# Patient Record
Sex: Male | Born: 1965 | Race: White | Hispanic: No | Marital: Married | Smoking: Never smoker
Health system: Southern US, Community
[De-identification: ages and names within clinical notes are randomized; demographics above are authoritative.]

## PROBLEM LIST (undated history)

## (undated) DIAGNOSIS — J45909 Unspecified asthma, uncomplicated: Secondary | ICD-10-CM

## (undated) DIAGNOSIS — R12 Heartburn: Secondary | ICD-10-CM

## (undated) DIAGNOSIS — I10 Essential (primary) hypertension: Secondary | ICD-10-CM

## (undated) DIAGNOSIS — J309 Allergic rhinitis, unspecified: Secondary | ICD-10-CM

## (undated) DIAGNOSIS — K219 Gastro-esophageal reflux disease without esophagitis: Secondary | ICD-10-CM

## (undated) DIAGNOSIS — R0789 Other chest pain: Secondary | ICD-10-CM

## (undated) DIAGNOSIS — R519 Headache, unspecified: Secondary | ICD-10-CM

## (undated) DIAGNOSIS — F410 Panic disorder [episodic paroxysmal anxiety] without agoraphobia: Secondary | ICD-10-CM

## (undated) DIAGNOSIS — N400 Enlarged prostate without lower urinary tract symptoms: Secondary | ICD-10-CM

## (undated) DIAGNOSIS — Q400 Congenital hypertrophic pyloric stenosis: Secondary | ICD-10-CM

## (undated) DIAGNOSIS — Z87442 Personal history of urinary calculi: Secondary | ICD-10-CM

## (undated) HISTORY — DX: Unspecified asthma, uncomplicated: J45.909

## (undated) HISTORY — DX: Congenital hypertrophic pyloric stenosis: Q40.0

## (undated) HISTORY — PX: OTHER SURGICAL HISTORY: SHX169

## (undated) HISTORY — DX: Panic disorder (episodic paroxysmal anxiety): F41.0

## (undated) HISTORY — DX: Heartburn: R12

## (undated) HISTORY — DX: Benign prostatic hyperplasia without lower urinary tract symptoms: N40.0

## (undated) HISTORY — DX: Gastro-esophageal reflux disease without esophagitis: K21.9

## (undated) HISTORY — DX: Other chest pain: R07.89

## (undated) HISTORY — DX: Allergic rhinitis, unspecified: J30.9

## (undated) HISTORY — DX: Personal history of urinary calculi: Z87.442

---

## 1994-06-14 HISTORY — PX: OTHER SURGICAL HISTORY: SHX169

## 2006-08-10 ENCOUNTER — Ambulatory Visit: Payer: Self-pay | Admitting: Family Medicine

## 2006-08-10 LAB — CONVERTED CEMR LAB
ALT: 26 units/L (ref 0–40)
Albumin: 4.1 g/dL (ref 3.5–5.2)
Alkaline Phosphatase: 45 units/L (ref 39–117)
BUN: 15 mg/dL (ref 6–23)
CO2: 28 meq/L (ref 19–32)
Calcium: 9.6 mg/dL (ref 8.4–10.5)
GFR calc Af Amer: 120 mL/min
GFR calc non Af Amer: 99 mL/min
Potassium: 4.6 meq/L (ref 3.5–5.1)
Total CHOL/HDL Ratio: 4
Total Protein: 6.8 g/dL (ref 6.0–8.3)
Triglycerides: 43 mg/dL (ref 0–149)
VLDL: 9 mg/dL (ref 0–40)

## 2007-01-11 ENCOUNTER — Ambulatory Visit: Payer: Self-pay | Admitting: Family Medicine

## 2007-01-11 DIAGNOSIS — F410 Panic disorder [episodic paroxysmal anxiety] without agoraphobia: Secondary | ICD-10-CM | POA: Insufficient documentation

## 2007-01-11 DIAGNOSIS — K219 Gastro-esophageal reflux disease without esophagitis: Secondary | ICD-10-CM | POA: Insufficient documentation

## 2007-01-25 ENCOUNTER — Telehealth (INDEPENDENT_AMBULATORY_CARE_PROVIDER_SITE_OTHER): Payer: Self-pay | Admitting: *Deleted

## 2007-02-03 ENCOUNTER — Encounter: Payer: Self-pay | Admitting: Family Medicine

## 2007-02-03 DIAGNOSIS — J309 Allergic rhinitis, unspecified: Secondary | ICD-10-CM | POA: Insufficient documentation

## 2007-02-15 ENCOUNTER — Ambulatory Visit: Payer: Self-pay | Admitting: Family Medicine

## 2007-07-26 ENCOUNTER — Telehealth: Payer: Self-pay | Admitting: Family Medicine

## 2007-08-28 ENCOUNTER — Telehealth: Payer: Self-pay | Admitting: Family Medicine

## 2007-08-30 ENCOUNTER — Ambulatory Visit: Payer: Self-pay | Admitting: Family Medicine

## 2007-08-30 DIAGNOSIS — R0789 Other chest pain: Secondary | ICD-10-CM | POA: Insufficient documentation

## 2007-09-26 ENCOUNTER — Ambulatory Visit: Payer: Self-pay | Admitting: Cardiology

## 2007-10-03 ENCOUNTER — Encounter: Payer: Self-pay | Admitting: Family Medicine

## 2007-10-03 ENCOUNTER — Ambulatory Visit: Payer: Self-pay | Admitting: Cardiology

## 2007-10-03 ENCOUNTER — Ambulatory Visit: Payer: Self-pay

## 2007-10-03 LAB — CONVERTED CEMR LAB
AST: 23 units/L (ref 0–37)
Albumin: 4.4 g/dL (ref 3.5–5.2)
BUN: 20 mg/dL (ref 6–23)
CO2: 23 meq/L (ref 19–32)
Calcium: 8.9 mg/dL (ref 8.4–10.5)
Chloride: 107 meq/L (ref 96–112)
Cholesterol: 141 mg/dL (ref 0–200)
Creatinine, Ser: 0.97 mg/dL (ref 0.40–1.50)
Glucose, Bld: 87 mg/dL (ref 70–99)
HDL: 43 mg/dL (ref >39)
Potassium: 4.5 meq/L (ref 3.5–5.3)
Total CHOL/HDL Ratio: 3.3

## 2008-01-08 ENCOUNTER — Telehealth: Payer: Self-pay | Admitting: Family Medicine

## 2008-12-20 ENCOUNTER — Ambulatory Visit: Payer: Self-pay | Admitting: Family Medicine

## 2008-12-20 DIAGNOSIS — R5381 Other malaise: Secondary | ICD-10-CM | POA: Insufficient documentation

## 2008-12-20 DIAGNOSIS — R5383 Other fatigue: Secondary | ICD-10-CM

## 2008-12-25 ENCOUNTER — Ambulatory Visit: Payer: Self-pay | Admitting: Family Medicine

## 2008-12-26 ENCOUNTER — Encounter: Payer: Self-pay | Admitting: Family Medicine

## 2008-12-26 LAB — CONVERTED CEMR LAB
Albumin: 4 g/dL (ref 3.5–5.2)
Alkaline Phosphatase: 45 units/L (ref 39–117)
BUN: 14 mg/dL (ref 6–23)
Chloride: 108 meq/L (ref 96–112)
Cholesterol: 138 mg/dL (ref 0–200)
Eosinophils Relative: 6.4 % — ABNORMAL HIGH (ref 0.0–5.0)
Glucose, Bld: 91 mg/dL (ref 70–99)
HCT: 42.5 % (ref 39.0–52.0)
Hemoglobin: 14.7 g/dL (ref 13.0–17.0)
LDL Cholesterol: 94 mg/dL (ref 0–99)
Lymphs Abs: 1.7 10*3/uL (ref 0.7–4.0)
MCV: 88.6 fL (ref 78.0–100.0)
Monocytes Absolute: 0.5 10*3/uL (ref 0.1–1.0)
Monocytes Relative: 8.3 % (ref 3.0–12.0)
Neutro Abs: 2.9 10*3/uL (ref 1.4–7.7)
Phosphorus: 2.7 mg/dL (ref 2.3–4.6)
Potassium: 4.7 meq/L (ref 3.5–5.1)
Total Bilirubin: 0.9 mg/dL (ref 0.3–1.2)
Total CHOL/HDL Ratio: 4
VLDL: 8.2 mg/dL (ref 0.0–40.0)
Vitamin B-12: 293 pg/mL (ref 211–911)
WBC: 5.5 10*3/uL (ref 4.5–10.5)

## 2009-05-12 ENCOUNTER — Ambulatory Visit: Payer: Self-pay | Admitting: Family Medicine

## 2009-05-12 DIAGNOSIS — K5289 Other specified noninfective gastroenteritis and colitis: Secondary | ICD-10-CM | POA: Insufficient documentation

## 2009-09-29 ENCOUNTER — Telehealth: Payer: Self-pay | Admitting: Family Medicine

## 2010-07-14 NOTE — Progress Notes (Signed)
Summary: Omeprazole  Phone Note From Pharmacy   [facility]er: [facility]  [address] [phone]* Call For: Dr. Ermalene Searing  Summary of Call: Faxed refill request for Omeprazole 40 mg. capsule.  Take 1 capsule every morning.    This is not on the patient's meds list.   Initial call taken by: Delilah Shan CMA (AAMA),  September 29, 2009 10:28 AM    New/Updated Medications: OMEPRAZOLE 40 MG CPDR (OMEPRAZOLE) Take 1 tablet by mouth once a day Prescriptions: OMEPRAZOLE 40 MG CPDR (OMEPRAZOLE) Take 1 tablet by mouth once a day  #30 x 11   Entered and Authorized by:   Kerby Nora MD   Signed by:   Kerby Nora MD on 09/29/2009   Method used:   [facility]        [facility]  [address] [phone]* (retail)       [address]       [place], [place]  [address]       Ph: [phone] or [phone]       Fax: [phone]   RxID:   [id number]

## 2010-10-27 NOTE — Assessment & Plan Note (Signed)
[facility] OFFICE NOTE   Blake Molina, Blake Molina                        MRN:          [id number]  DATE:09/26/2007                            DOB:          02-16-1966    REASON FOR CONSULTATION:  Evaluate patient with chest pain.   HISTORY OF PRESENT ILLNESS:  The patient is a 45 year old, white  gentleman without prior cardiac history.  He does have a family history  of early heart disease.  He has been having chest discomfort.  He says  this has been going on for months.  It is sporadic.  When he gets it, it  may last for a 1/2 hour at a time.  He does not associate it with  activity.  He says he does not think it feels like his reflux which is a  severe burning.  This discomfort is 2/10 in intensity.  It seems to  concentrate in the left upper sternal area.  He may notice some  exacerbation when he tries to swallow hard.  He says it feels like the  food may be stuck in that area.  He does not get any associated nausea,  vomiting or diaphoresis.  He does not get any jaw or arm discomfort.  He  works hard, although he does not exercise routinely.  With his vigorous  activity, he does not bring on these symptoms.  He thinks the symptoms  have been a fairly stable pattern over the months.  However, because of  his family history, he is referred for further cardiac evaluation.  He  did have a mildly abnormal EKG with some peaked T-waves and RSR prime in  V1.   PAST MEDICAL HISTORY:  Gastroesophageal reflux disease.   PAST SURGICAL HISTORY:  1. Lumbar surgery.  2. Pyloric stenosis repaired as a child.   ALLERGIES:  No known drug allergies.   MEDICATIONS:  1. Albuterol.  2. Advil.  3. BuSpar 5 mg nightly.  4. Prilosec 40 mg daily.  5. Tums.  6. Asthmanex.   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  He is married.  He has three teenage children.  He has never  smoked cigarettes.  He rarely drinks alcohol.   FAMILY HISTORY:  Contributory for his father needing coronary stent at  age 25.   REVIEW OF SYSTEMS:  Positive for occasional headaches, chest pain, rare  palpitations, probable sleep apnea, occasional asthma.  Negative for  other systems.   PHYSICAL EXAMINATION:  GENERAL:  The patient is in no distress.  VITAL SIGNS:  Blood pressure 118/76, heart rate 64 and regular, weight  159 pounds.  HEENT:  Eyes are unremarkable, pupils equal, round, reactive to light,  fundi within normal limits.  Oral mucosa unremarkable.  NECK:  No jugular distention at 45 degrees.  No carotid upstroke.  Brisk  and symmetrical.  No bruits, no thyromegaly.  LYMPHATICS:  No cervical, axillary or inguinal adenopathy.  LUNGS:  Clear to auscultation bilaterally.  BACK:  No costovertebral angle tenderness.  CHEST:  Unremarkable.  HEART:  PMI not displaced or sustained, S1 and S2 within normal limits,  no S3 and S4.  No clicks, rubs, murmurs.  ABDOMEN:  Flat, positive bowel sounds.  Normal in frequency and pitch,  no bruits, rebound, no guarding or midline pulsatile masses, no  hepatomegaly or splenomegaly.  SKIN:  No rashes, no nodules.  EXTREMITIES:  2+ pulses throughout, no edema, cyanosis or clubbing.  NEUROLOGIC:  Oriented to person, place and time.  Cranial nerves II-XII  grossly intact.  Motor grossly intact.   EKG sinus rhythm, axis within normal limits, intervals within normal  limits, early repolarization pattern.   ASSESSMENT/PLAN:  1. Chest pain.  The patient's chest pain is atypical from a      cardiovascular standpoint.  However, he does have a family history.      Given this, I think exercise stress testing with a POET (plain old      exercise treadmill) is reasonable.  This will allow [place] to screen      for coronary disease for which he has a low pretest probability.      This will allow [place] to risk stratify and most importantly give him a       prescription for exercise.  2. Risk reduction.  He needs a lipid profile and he will have this      done fasting.  We would be happy to review this.  3. Followup will be based on results of the above.     Rollene Rotunda, MD, [facility]  Electronically Signed    JH/MedQ  DD: 09/26/2007  DT: 09/26/2007  Job #: [id number]   cc:   Kerby Nora, MD

## 2010-10-30 NOTE — Assessment & Plan Note (Signed)
[facility]                           [place] OFFICE NOTE   Blake Molina, Blake Molina                        MRN:          [id number]  DATE:08/10/2006                            DOB:          [birth year]    CHIEF COMPLAINT:  A 45 year old white male here to establish new doctor.   HISTORY OF PRESENT ILLNESS:  Mr. Blake Molina has not seen a doctor in many  years.  He comes to clinic today with the following concerns:  1. Reflux, chronic:  He has tried Prilosec over-the-counter 20 mg      which helped some.  He states he has also tried to decrease the      amount of caffeine in the soda he drinks, as well as chocolate.  He      states his symptoms are mainly burning and sour taste in his throat      before and after eating.  He also has nausea before he eats, which      goes away with food.  He denies any abdominal pain, vomiting,      diarrhea, constipation, or blood in his stool.  2. 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  He is unaware of anything that may have triggered this      response.  He has 30 seconds of sudden-onset jittery and      nervousness; no associated chest pain, shortness of breath,      headache, dizziness.  They had been happening about two times a      week for a few months but have not happened for a while.  He is      under a lot of stress at work as well as stress at home over      frustration concerns with his wife's and his libido not matching.      He denies any depressive mood, manic symptoms, or any past history      of anything similar.  3. Asthma, moderate persistent:  He states that he has had asthma      since being a child and it has worsened some over the past few      years.  He states he uses albuterol for a sensation of labored      breathing approximately daily.  He states that this handles it most  often.  He denies being on a controller medicine in the past.  He      denies nighttime cough.  He was hospitalized as a child for asthma      but has not had an asthma attack in many years.   REVIEW OF SYSTEMS:  Otherwise negative.   PAST MEDICAL HISTORY:  1. Asthma, moderate persistent.  2. GERD.  3. Allergic rhinitis.  4. History of pyloric stenosis as infant.   HOSPITALIZATIONS, SURGERIES, PROCEDURES:  1. Hospitalized at age 23 for asthma; no intubation.  2. In 1960s, pyloric  stenosis repair.  3. In 1996, herniated disc resulting in discectomy.   ALLERGIES:  None.   MEDICATIONS:  1. Albuterol inhaled two puffs q.4h. p.r.n.  2. Advil p.r.n.  3. Claritin p.r.n.  4. Tums p.r.n.   SOCIAL HISTORY:  No tobacco use.  Occasional alcohol use about one time  per month.  No history of drug use.  He works as a Clinical cytogeneticist.  He is married.  He has three children who are healthy.  He walks at work  and has an elliptical but does not use it regularly.  He eats about two  meals per day including fruits and vegetables and about one to two times  per week eats fast food.   FAMILY HISTORY:  Father alive at age 96 with coronary artery disease  resulting in a stent placement recently, as well as hypertension.  Mother alive at age 62 with allergies.  Paternal grandfather with  hypertension.  One brother with asthma and allergies.  No family history  of any type of cancer.   PHYSICAL EXAMINATION:  VITAL SIGNS:  Height 70 inches, weight 166.4,  making BMI 22-23.  Blood pressure 104/70, pulse 72, temperature 98.  GENERAL:  Healthy-appearing male in no apparent distress.  HEENT:  PERRLA, extraocular muscles intact, oropharynx clear.  Pterygium  bilateral medial eye.  Nares clear.  Oropharynx clear.  No thyromegaly,  no lymphadenopathy supraclavicular or cervical.  CARDIOVASCULAR:  Regular rate and rhythm.  No murmurs, rubs or gallops.  Normal PMI, 2+ peripheral pulses, no peripheral  edema.  LUNGS:  Clear to auscultation bilaterally.  No wheezes, rales, or  rhonchi.  ABDOMEN:  Soft, nontender, normal active bowel sounds, no  hepatosplenomegaly, no rebound, no guarding.  MUSCULOSKELETAL:  Strength 5/5 in upper and lower extremities.  NEUROLOGIC:  Cranial nerves II-XII grossly intact.  PSYCHIATRIC:  Appropriate affect, denies hallucinations as well as  suicidality.   ASSESSMENT AND PLAN:  1. Gastroesophageal reflux disease plus or minus gastritis:  Will      initiate him on omeprazole 40 mg daily for the next 6 weeks.  He      will then begin to try and taper this down.  If his symptoms do not      improve with the 40 mg of omeprazole, he will let me know and we      can try a stronger medication.  If then the stronger medication      such as Nexium, AcipHex, or Protonix does not work, we will refer      him to a gastroenterologist for EGD.  I also gave him information      and instructed him on how to behaviorally and with diet reduce      reflux and gastritis symptoms.  He will also begin eating more      regularly three meals a day with healthy snacks in-between, and      small portion sizes.  2. Panic symptoms:  His symptoms are very momentary.  We discussed      possible treatment for this with having him see a counselor or      therapist for stress reduction.  He is not interested in this at      this point in time.  I do not think a medication such as Ativan      would be helpful, given the short length of symptoms.  I also      suggested marriage counseling to decrease the stress regarding  the      libido difference in his wife, who is going through menopause.  3. Asthma, moderate persistent:  We discussed the need for a      controller medication.  He is not interested in starting this at      this point in time.  He did have peak flows today that were      excellent at around 700, with his goal being between 487 to 609.     He will try and limit albuterol  use so that he does not build up a      tolerance.  He will let me know if he is having labored breathing      with activities or frequent albuterol use or nighttime cough.  4. Prevention:  We will check a cholesterol panel as well as a      complete metabolic panel.  He is encouraged to take multivitamin as      well as to work on regular exercise and healthy eating habits.  He      is not due for a PSA until age 59.  He will return as needed.     Kerby Nora, MD  Electronically Signed    AB/MedQ  DD: 08/10/2006  DT: 08/10/2006  Job #: [id number]

## 2010-12-03 ENCOUNTER — Encounter: Payer: Self-pay | Admitting: Cardiovascular Disease

## 2010-12-17 ENCOUNTER — Encounter: Payer: Self-pay | Admitting: Family Medicine

## 2010-12-18 ENCOUNTER — Ambulatory Visit (INDEPENDENT_AMBULATORY_CARE_PROVIDER_SITE_OTHER): Payer: BC Managed Care – PPO | Admitting: Family Medicine

## 2010-12-18 ENCOUNTER — Encounter: Payer: Self-pay | Admitting: Family Medicine

## 2010-12-18 VITALS — BP 110/80 | HR 60 | Temp 98.4°F | Wt 159.1 lb

## 2010-12-18 DIAGNOSIS — R002 Palpitations: Secondary | ICD-10-CM | POA: Insufficient documentation

## 2010-12-18 NOTE — Patient Instructions (Signed)
Pass by Marion's or Cynthia's office to schedule cardiology appointmnet for holter monitor to check on irregular heart beat. You've done all the things I would recommend including cutting back on energy drink and caffeine.  Push more water. Update [place] if worsening, or any chest pain, or prolonged shortness of breath. Good to meet you today, call us with questions.

## 2010-12-18 NOTE — Assessment & Plan Note (Addendum)
Happening daily for last 2 wks. Set up with 24 hour holter monitor. Discussed likely benign ie PAC, PVC but will set up with 24 hour holter monitor. States had recent exercise treadmill a few years back and told all normal.  No results in system. Encouraged abstinence from energy drinks, caffeine.  EKG - sinus brady 51, nl axis, intervals, no hypertrophy, ? Early replarization, overall unchanged from previous EKG.  Rsr, but normal QRS duration.

## 2010-12-18 NOTE — Progress Notes (Signed)
  Subjective:    Patient ID: Blake Molina, male    DOB: 09/12/1965, 45 y.o.   MRN: [id number]  HPI CC: palpitations  2 wk h/o palpitations, heart skipping beat then loud beat afterwards.  Tends to happen when driving or sitting at home, not at work.  Feels mild quick SOB with episodes that lasts seconds.  No problems in am, more at night.  Not waking up from sleep.  Happened last night for 30 min.  Denies chest pain/tightness.  Denies tachypalpitations or racing heart.  Denies dizziness, lightheadedness, HA, vision changes.  No LOC.    Caffeine - used to drink one energy drink (Amp) daily and pepsi, sweet tea, coffee.  1 wk ago cut back significantly on caffeine, no more Amp, didn't notice improvement.  Eats chocolate.  Nonsmoker.  Several years ago had episodes of panic, improved with bupsar but not on anymore.  Stress overall stable.  Works for himself, stays good busy.  FmHx: father with stent placed 1 year ago at age 23yo.    Lab Results  Component Value Date   LDLCALC 94 12/25/2008   Review of Systems Per HPI    Objective:   Physical Exam  Nursing note and vitals reviewed. Constitutional: He appears well-developed and well-nourished. No distress.  HENT:  Head: Normocephalic and atraumatic.  Mouth/Throat: Oropharynx is clear and moist. No oropharyngeal exudate.  Eyes: Conjunctivae and EOM are normal. Pupils are equal, round, and reactive to light. No scleral icterus.  Neck: Normal range of motion. Neck supple. No thyromegaly present.  Cardiovascular: Normal rate, regular rhythm, normal heart sounds and intact distal pulses.   No murmur heard. Pulmonary/Chest: Effort normal and breath sounds normal. No respiratory distress. He has no wheezes. He has no rales.  Lymphadenopathy:    He has no cervical adenopathy.  Skin: Skin is warm and dry. No rash noted.  Psychiatric: He has a normal mood and affect.          Assessment & Plan:

## 2010-12-21 ENCOUNTER — Encounter: Payer: Self-pay | Admitting: *Deleted

## 2010-12-23 ENCOUNTER — Other Ambulatory Visit: Payer: Self-pay | Admitting: Family Medicine

## 2010-12-23 ENCOUNTER — Ambulatory Visit (INDEPENDENT_AMBULATORY_CARE_PROVIDER_SITE_OTHER): Payer: BC Managed Care – PPO | Admitting: *Deleted

## 2010-12-23 DIAGNOSIS — R002 Palpitations: Secondary | ICD-10-CM

## 2010-12-30 ENCOUNTER — Telehealth: Payer: Self-pay | Admitting: *Deleted

## 2010-12-30 NOTE — Telephone Encounter (Signed)
Spoke to pt's wife regarding results of holter. Normal sinus rhythm with rare PACs and PVCs. Pt did not report any episodes or symptoms in diary for holter. Notified wife that these extra beats are typically benign, and would not require treatment unless pt symptomatic or bothersome and wanted to take medication, in which case usually treated with a beta blocker. She will notify pt of results and he is to call with any questions or concerns. Will forward this note to PCP Dr. Sharen Hones, and will fax as well. Please advise of anything different. Thanks.

## 2010-12-30 NOTE — Telephone Encounter (Signed)
Noted thanks.  Will await fax.

## 2011-02-17 ENCOUNTER — Telehealth: Payer: Self-pay | Admitting: *Deleted

## 2011-02-17 MED ORDER — ALBUTEROL 90 MCG/ACT IN AERS: 1.0000 | INHALATION_SPRAY | RESPIRATORY_TRACT | Status: DC | PRN

## 2011-02-17 MED ORDER — MOMETASONE FUROATE 220 MCG/INH IN AEPB: 1.0000 | INHALATION_SPRAY | RESPIRATORY_TRACT | Status: DC

## 2011-02-17 NOTE — Telephone Encounter (Signed)
Patient requested refills on both inhalers.  He stated that he generally does not need to use the inhalers much but for some reason he is using them more now.  He requested three inhalers so he can keep one at home, work, and in the car.  [facility]

## 2012-05-09 ENCOUNTER — Other Ambulatory Visit: Payer: Self-pay

## 2012-05-09 MED ORDER — ALBUTEROL SULFATE HFA 108 (90 BASE) MCG/ACT IN AERS: 2.0000 | INHALATION_SPRAY | Freq: Four times a day (QID) | RESPIRATORY_TRACT | Status: DC | PRN

## 2012-05-09 NOTE — Telephone Encounter (Signed)
Ok, ventolin 2 puffs q 4 hours prn wheezing, #1, 0 refills

## 2012-05-09 NOTE — Telephone Encounter (Signed)
Pt left v/m requesting Albuterol refill to [facility] [place]. When tried to bring up refill under meds & orders list; note came up no longer available for order; needs new orders. Pt last seen 12/18/10 for palpitations.Please advise.

## 2013-11-13 ENCOUNTER — Encounter: Payer: Self-pay | Admitting: Cardiovascular Disease

## 2014-09-17 ENCOUNTER — Other Ambulatory Visit: Payer: Self-pay | Admitting: Family Medicine

## 2014-09-17 ENCOUNTER — Telehealth: Payer: Self-pay | Admitting: Family Medicine

## 2014-09-17 NOTE — Telephone Encounter (Signed)
Pt called and insisted on getting a refill on albuterol inhalers.  Stated he wanted 3, one for each car.  Offered to make an appointment for pt, has been over 3 years since seen.  Pt declined, will go to ER or to urgent care in the [place] area.  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

## 2014-09-17 NOTE — Telephone Encounter (Signed)
Noted  

## 2015-08-04 ENCOUNTER — Emergency Department
Admission: EM | Admit: 2015-08-04 | Discharge: 2015-08-04 | Disposition: A | Discharge status: Home / Self Care | Payer: BLUE CROSS/BLUE SHIELD | Attending: Emergency Medicine | Admitting: Emergency Medicine

## 2015-08-04 ENCOUNTER — Emergency Department: Payer: BLUE CROSS/BLUE SHIELD

## 2015-08-04 DIAGNOSIS — R1032 Left lower quadrant pain: Secondary | ICD-10-CM | POA: Diagnosis present

## 2015-08-04 DIAGNOSIS — Z79899 Other long term (current) drug therapy: Secondary | ICD-10-CM | POA: Insufficient documentation

## 2015-08-04 DIAGNOSIS — N200 Calculus of kidney: Secondary | ICD-10-CM

## 2015-08-04 LAB — COMPREHENSIVE METABOLIC PANEL
ALBUMIN: 4.3 g/dL (ref 3.5–5.0)
ALT: 21 U/L (ref 17–63)
ANION GAP: 4 — AB (ref 5–15)
AST: 26 U/L (ref 15–41)
Alkaline Phosphatase: 48 U/L (ref 38–126)
BILIRUBIN TOTAL: 0.7 mg/dL (ref 0.3–1.2)
BUN: 18 mg/dL (ref 6–20)
CHLORIDE: 111 mmol/L (ref 101–111)
CO2: 27 mmol/L (ref 22–32)
Calcium: 9.6 mg/dL (ref 8.9–10.3)
Creatinine, Ser: 1.08 mg/dL (ref 0.61–1.24)
GFR calc Af Amer: >60 mL/min (ref >60)
GFR calc non Af Amer: >60 mL/min (ref >60)
GLUCOSE: 114 mg/dL — AB (ref 65–99)
POTASSIUM: 3.4 mmol/L — AB (ref 3.5–5.1)
Sodium: 142 mmol/L (ref 135–145)
TOTAL PROTEIN: 7.1 g/dL (ref 6.5–8.1)

## 2015-08-04 LAB — URINALYSIS COMPLETE WITH MICROSCOPIC (ARMC ONLY)
Bilirubin Urine: NEGATIVE
Glucose, UA: NEGATIVE mg/dL
Leukocytes, UA: NEGATIVE
Nitrite: NEGATIVE
Protein, ur: NEGATIVE mg/dL
Specific Gravity, Urine: 1.028 (ref 1.005–1.030)
Squamous Epithelial / HPF: NONE SEEN
pH: 9 — ABNORMAL HIGH (ref 5.0–8.0)

## 2015-08-04 LAB — CBC
HCT: 43.4 % (ref 40.0–52.0)
HEMOGLOBIN: 14.5 g/dL (ref 13.0–18.0)
MCH: 28.6 pg (ref 26.0–34.0)
MCHC: 33.4 g/dL (ref 32.0–36.0)
MCV: 85.7 fL (ref 80.0–100.0)
Platelets: 289 10*3/uL (ref 150–440)
RBC: 5.06 MIL/uL (ref 4.40–5.90)
RDW: 13.8 % (ref 11.5–14.5)
WBC: 12.7 10*3/uL — ABNORMAL HIGH (ref 3.8–10.6)

## 2015-08-04 MED ORDER — MORPHINE SULFATE (PF) 4 MG/ML IV SOLN
INTRAVENOUS | Status: AC
  Administered 2015-08-04: 4 mg via INTRAVENOUS
  Filled 2015-08-04: qty 1

## 2015-08-04 MED ORDER — MORPHINE SULFATE (PF) 4 MG/ML IV SOLN
INTRAVENOUS | Status: AC
  Administered 2015-08-04: 05:00:00
  Filled 2015-08-04: qty 1

## 2015-08-04 MED ORDER — OXYCODONE-ACETAMINOPHEN 5-325 MG PO TABS: 1.0000 | ORAL_TABLET | Freq: Four times a day (QID) | ORAL | Status: DC | PRN

## 2015-08-04 MED ORDER — TAMSULOSIN HCL 0.4 MG PO CAPS: 0.4000 mg | ORAL_CAPSULE | Freq: Every day | ORAL | Status: DC

## 2015-08-04 MED ORDER — ONDANSETRON HCL 4 MG/2ML IJ SOLN
INTRAMUSCULAR | Status: AC
  Administered 2015-08-04: 4 mg via INTRAVENOUS
  Filled 2015-08-04: qty 2

## 2015-08-04 MED ORDER — ONDANSETRON HCL 4 MG/2ML IJ SOLN
4.0000 mg | Freq: Once | INTRAMUSCULAR | Status: AC
  Administered 2015-08-04: 4 mg via INTRAVENOUS

## 2015-08-04 MED ORDER — IOHEXOL 300 MG/ML  SOLN
100.0000 mL | Freq: Once | INTRAMUSCULAR | Status: AC | PRN
  Administered 2015-08-04: 100 mL via INTRAVENOUS

## 2015-08-04 MED ORDER — SODIUM CHLORIDE 0.9 % IV BOLUS (SEPSIS)
1000.0000 mL | Freq: Once | INTRAVENOUS | Status: AC
  Administered 2015-08-04: 1000 mL via INTRAVENOUS

## 2015-08-04 MED ORDER — KETOROLAC TROMETHAMINE 30 MG/ML IJ SOLN
30.0000 mg | Freq: Once | INTRAMUSCULAR | Status: AC
  Administered 2015-08-04: 30 mg via INTRAVENOUS
  Filled 2015-08-04: qty 1

## 2015-08-04 MED ORDER — IOHEXOL 240 MG/ML SOLN
25.0000 mL | Freq: Once | INTRAMUSCULAR | Status: AC | PRN
  Administered 2015-08-04: 25 mL via ORAL

## 2015-08-04 MED ORDER — OXYCODONE-ACETAMINOPHEN 5-325 MG PO TABS
1.0000 | ORAL_TABLET | Freq: Once | ORAL | Status: AC
  Administered 2015-08-04: 1 via ORAL
  Filled 2015-08-04: qty 1

## 2015-08-04 MED ORDER — MORPHINE SULFATE (PF) 4 MG/ML IV SOLN
4.0000 mg | Freq: Once | INTRAVENOUS | Status: AC
  Administered 2015-08-04: 4 mg via INTRAVENOUS

## 2015-08-04 MED ORDER — ONDANSETRON 4 MG PO TBDP: 4.0000 mg | ORAL_TABLET | Freq: Three times a day (TID) | ORAL | Status: DC | PRN

## 2015-08-04 NOTE — Discharge Instructions (Signed)
Kidney Stones Kidney stones (urolithiasis) are deposits that form inside your kidneys. The intense pain is caused by the stone moving through the urinary tract. When the stone moves, the ureter goes into spasm around the stone. The stone is usually passed in the urine.  CAUSES   A disorder that makes certain neck glands produce too much parathyroid hormone (primary hyperparathyroidism).  A buildup of uric acid crystals, similar to gout in your joints.  Narrowing (stricture) of the ureter.  A kidney obstruction present at birth (congenital obstruction).  Previous surgery on the kidney or ureters.  Numerous kidney infections. SYMPTOMS   Feeling sick to your stomach (nauseous).  Throwing up (vomiting).  Blood in the urine (hematuria).  Pain that usually spreads (radiates) to the groin.  Frequency or urgency of urination. DIAGNOSIS   Taking a history and physical exam.  Blood or urine tests.  CT scan.  Occasionally, an examination of the inside of the urinary bladder (cystoscopy) is performed. TREATMENT   Observation.  Increasing your fluid intake.  Extracorporeal shock wave lithotripsy--This is a noninvasive procedure that uses shock waves to break up kidney stones.  Surgery may be needed if you have severe pain or persistent obstruction. There are various surgical procedures. Most of the procedures are performed with the use of small instruments. Only small incisions are needed to accommodate these instruments, so recovery time is minimized. The size, location, and chemical composition are all important variables that will determine the proper choice of action for you. Talk to your health care provider to better understand your situation so that you will minimize the risk of injury to yourself and your kidney.  HOME CARE INSTRUCTIONS   Drink enough water and fluids to keep your urine clear or pale yellow. This will help you to pass the stone or stone fragments.  Strain  all urine through the provided strainer. Keep all particulate matter and stones for your health care provider to see. The stone causing the pain may be as small as a grain of salt. It is very important to use the strainer each and every time you pass your urine. The collection of your stone will allow your health care provider to analyze it and verify that a stone has actually passed. The stone analysis will often identify what you can do to reduce the incidence of recurrences.  Only take over-the-counter or prescription medicines for pain, discomfort, or fever as directed by your health care provider.  Keep all follow-up visits as told by your health care provider. This is important.  Get follow-up X-rays if required. The absence of pain does not always mean that the stone has passed. It may have only stopped moving. If the urine remains completely obstructed, it can cause loss of kidney function or even complete destruction of the kidney. It is your responsibility to make sure X-rays and follow-ups are completed. Ultrasounds of the kidney can show blockages and the status of the kidney. Ultrasounds are not associated with any radiation and can be performed easily in a matter of minutes.  Make changes to your daily diet as told by your health care provider. You may be told to:  Limit the amount of salt that you eat.  Eat 5 or more servings of fruits and vegetables each day.  Limit the amount of meat, poultry, fish, and eggs that you eat.  Collect a 24-hour urine sample as told by your health care provider.You may need to collect another urine sample every 6-12  months. SEEK MEDICAL CARE IF:  You experience pain that is progressive and unresponsive to any pain medicine you have been prescribed. SEEK IMMEDIATE MEDICAL CARE IF:   Pain cannot be controlled with the prescribed medicine.  You have a fever or shaking chills.  The severity or intensity of pain increases over 18 hours and is not  relieved by pain medicine.  You develop a new onset of abdominal pain.  You feel faint or pass out.  You are unable to urinate.   This information is not intended to replace advice given to you by your health care provider. Make sure you discuss any questions you have with your health care provider.   Document Released: 05/31/2005 Document Revised: 02/19/2015 Document Reviewed: 11/01/2012 Elsevier Interactive Patient Education 2016 [place] are compounds that affect the level of uric acid in your body. A low-purine diet is a diet that is low in purines. Eating a low-purine diet can prevent the level of uric acid in your body from getting too high and causing gout or kidney stones or both. WHAT DO I NEED TO KNOW ABOUT THIS DIET?  Choose low-purine foods. Examples of low-purine foods are listed in the next section.  Drink plenty of fluids, especially water. Fluids can help remove uric acid from your body. Try to drink 8-16 cups (1.9-3.8 L) a day.  Limit foods high in fat, especially saturated fat, as fat makes it harder for the body to get rid of uric acid. Foods high in saturated fat include pizza, cheese, ice cream, whole milk, fried foods, and gravies. Choose foods that are lower in fat and lean sources of protein. Use olive oil when cooking as it contains healthy fats that are not high in saturated fat.  Limit alcohol. Alcohol interferes with the elimination of uric acid from your body. If you are having a gout attack, avoid all alcohol.  Keep in mind that different people's bodies react differently to different foods. You will probably learn over time which foods do or do not affect you. If you discover that a food tends to cause your gout to flare up, avoid eating that food. You can more freely enjoy foods that do not cause problems. If you have any questions about a food item, talk to your dietitian or health care provider. WHICH FOODS ARE LOW, MODERATE,  AND HIGH IN PURINES? The following is a list of foods that are low, moderate, and high in purines. You can eat any amount of the foods that are low in purines. You may be able to have small amounts of foods that are moderate in purines. Ask your health care provider how much of a food moderate in purines you can have. Avoid foods high in purines. Grains  Foods low in purines: Enriched white bread, pasta, rice, cake, cornbread, popcorn.  Foods moderate in purines: Whole-grain breads and cereals, wheat germ, bran, oatmeal. Uncooked oatmeal. Dry wheat bran or wheat germ.  Foods high in purines: Pancakes, [place] toast, biscuits, muffins. Vegetables  Foods low in purines: All vegetables, except those that are moderate in purines.  Foods moderate in purines: Asparagus, cauliflower, spinach, mushrooms, green peas. Fruits  All fruits are low in purines. Meats and other Protein Foods  Foods low in purines: Eggs, nuts, peanut butter.  Foods moderate in purines: 80-90% lean beef, lamb, veal, pork, poultry, fish, eggs, peanut butter, nuts. Crab, lobster, oysters, and shrimp. Cooked dried beans, peas, and lentils.  Foods high in  purines: Anchovies, sardines, herring, mussels, tuna, codfish, scallops, trout, and haddock. Berniece Salines. Organ meats (such as liver or kidney). Tripe. Game meat. Goose. Sweetbreads. Dairy  All dairy foods are low in purines. Low-fat and fat-free dairy products are best because they are low in saturated fat. Beverages  Drinks low in purines: Water, carbonated beverages, tea, coffee, cocoa.  Drinks moderate in purines: Soft drinks and other drinks sweetened with high-fructose corn syrup. Juices. To find whether a food or drink is sweetened with high-fructose corn syrup, look at the ingredients list.  Drinks high in purines: Alcoholic beverages (such as beer). Condiments  Foods low in purines: Salt, herbs, olives, pickles, relishes, vinegar.  Foods moderate in purines:  Butter, margarine, oils, mayonnaise. Fats and Oils  Foods low in purines: All types, except gravies and sauces made with meat.  Foods high in purines: Gravies and sauces made with meat. Other Foods  Foods low in purines: Sugars, sweets, gelatin. Cake. Soups made without meat.  Foods moderate in purines: Meat-based or fish-based soups, broths, or bouillons. Foods and drinks sweetened with high-fructose corn syrup.  Foods high in purines: High-fat desserts (such as ice cream, cookies, cakes, pies, doughnuts, and chocolate). Contact your dietitian for more information on foods that are not listed here.   This information is not intended to replace advice given to you by your health care provider. Make sure you discuss any questions you have with your health care provider.   Document Released: 09/25/2010 Document Revised: 06/05/2013 Document Reviewed: 05/07/2013 Elsevier Interactive Patient Education [employer].

## 2015-08-04 NOTE — ED Notes (Signed)
EMS pt to rm 6 from home with co LLQ ABD pain since around midnight. Pt denies n/v/d

## 2015-08-04 NOTE — ED Provider Notes (Signed)
[facility] Department Provider Note  ____________________________________________  Time seen: Approximately 3:15 AM  I have reviewed the triage vital signs and the nursing notes.   HISTORY  Chief Complaint Abdominal Pain    HPI Blake Molina is a 50 y.o. male who comes into the hospital today with some sided abdominal pain. The patient ate some spicy [place] food yesterday and noticed that he had some left lower quadrant abdominal pain. He reports though that the pain had improved. He ate some less spicy food today and some other normal foods and his pain started up again around midnight. He reports that he's had no diarrhea or constipation and he made himself vomit with the thought it may be due to the food. The patient reports though that the pain has not been improving. He did not take anything for the pain but rates his pain as a 9 out of 10 in intensity. He says it is aching and stabbing in his left lower quadrant. He also does have some mild back pain but does have a history of chronic back pain. The patient reports that he was not sure what was going on so he decided to come in and get checked out. The patient had no fevers no chest pain or shortness of breath. He's noticed no change in his urine as well.   Past Medical History  Diagnosis Date  . Other chest pain   . Congenital hypertrophic pyloric stenosis   . Unspecified asthma(493.90)   . Allergic rhinitis, cause unspecified   . Esophageal reflux   . Panic disorder without agoraphobia     Patient Active Problem List   Diagnosis Date Noted  . Palpitations 12/18/2010  . GASTROENTERITIS 05/12/2009  . FATIGUE 12/20/2008  . CHEST PAIN, ATYPICAL 08/30/2007  . ALLERGIC RHINITIS 02/03/2007  . PANIC ATTACK 01/11/2007  . G E R D 01/11/2007    Past Surgical History  Procedure Laterality Date  . Stenosis surgery (other)  1960s    hx of pyloric stenosis as infant  . Herniated disc-back  surgery  1996    Current Outpatient Rx  Name  Route  Sig  Dispense  Refill  . albuterol (PROVENTIL HFA;VENTOLIN HFA) 108 (90 BASE) MCG/ACT inhaler   Inhalation   Inhale 2 puffs into the lungs every 6 (six) hours as needed for wheezing.   1 Inhaler   0   . albuterol (PROVENTIL,VENTOLIN) 90 MCG/ACT inhaler   Inhalation   Inhale 1-2 puffs into the lungs as needed.   3 each   3     Patient request generic   . diphenoxylate-atropine (LOMOTIL) 2.5-0.025 MG per tablet   Oral   Take 1 tablet by mouth 4 (four) times daily as needed.           . Ibuprofen (ADVIL) 200 MG CAPS   Oral   Take by mouth as needed.           . mometasone (ASMANEX 14 METERED DOSES) 220 MCG/INH inhaler   Inhalation   Inhale 1 puff into the lungs every morning.   3 Inhaler   3     Patient request generic   . omeprazole (PRILOSEC) 40 MG capsule   Oral   Take 40 mg by mouth daily.           . ondansetron (ZOFRAN ODT) 4 MG disintegrating tablet   Oral   Take 1 tablet (4 mg total) by mouth every 8 (eight) hours as needed  for nausea or vomiting.   20 tablet   0   . oxyCODONE-acetaminophen (ROXICET) 5-325 MG tablet   Oral   Take 1 tablet by mouth every 6 (six) hours as needed.   12 tablet   0   . promethazine (PHENERGAN) 25 MG tablet   Oral   Take 25 mg by mouth every 6 (six) hours as needed.           . tamsulosin (FLOMAX) 0.4 MG CAPS capsule   Oral   Take 1 capsule (0.4 mg total) by mouth daily.   7 capsule   0     Allergies Review of patient's allergies indicates no known allergies.  Family History  Problem Relation Age of Onset  . Coronary artery disease Father     stent age 58  . Hypertension Father   . Allergies Mother   . Asthma Brother   . Allergies Brother   . Hypertension Paternal Grandfather     Social History Social History  Substance Use Topics  . Smoking status: Never Smoker   . Smokeless tobacco: None  . Alcohol Use: Yes     Comment: Occasionally, maybe  1/month     Review of Systems Constitutional: No fever/chills Eyes: No visual changes. ENT: No sore throat. Cardiovascular: Denies chest pain. Respiratory: Denies shortness of breath. Gastrointestinal:  abdominal pain.  No nausea, no vomiting.  No diarrhea.  No constipation. Genitourinary: Negative for dysuria. Musculoskeletal: Negative for back pain. Skin: Negative for rash. Neurological: Negative for headaches, focal weakness or numbness.  10-point ROS otherwise negative.  ____________________________________________   PHYSICAL EXAM:  VITAL SIGNS: ED Triage Vitals  Enc Vitals Group     BP 08/04/15 0320 122/87 mmHg     Pulse Rate 08/04/15 0320 86     Resp 08/04/15 0320 22     Temp 08/04/15 0322 99.2 F (37.3 C)     Temp Source 08/04/15 0320 Oral     SpO2 08/04/15 0320 100 %     Weight 08/04/15 0320 170 lb (77.111 kg)     Height 08/04/15 0320 5\' 11"  (1.803 m)     Head Cir --      Peak Flow --      Pain Score 08/04/15 0317 9     Pain Loc --      Pain Edu? --      Excl. in [place]? --     Constitutional: Alert and oriented. Well appearing and in moderate to severe distress. Eyes: Conjunctivae are normal. PERRL. EOMI. Head: Atraumatic. Nose: No congestion/rhinnorhea. Mouth/Throat: Mucous membranes are moist.  Oropharynx non-erythematous. Cardiovascular: Normal rate, regular rhythm. Grossly normal heart sounds.  Good peripheral circulation. Respiratory: Normal respiratory effort.  No retractions. Lungs CTAB. Gastrointestinal: Soft left lower quadrant tenderness to palpation. No distention. Positive bowel sounds Musculoskeletal: No lower extremity tenderness nor edema.   Neurologic:  Normal speech and language.  Skin:  Skin is warm, dry and intact. No rash noted. Psychiatric: Mood and affect are normal.   ____________________________________________   LABS (all labs ordered are listed, but only abnormal results are displayed)  Labs Reviewed  CBC - Abnormal; Notable  for the following:    WBC 12.7 (*)    All other components within normal limits  COMPREHENSIVE METABOLIC PANEL - Abnormal; Notable for the following:    Potassium 3.4 (*)    Glucose, Bld 114 (*)    Anion gap 4 (*)    All other components within normal limits  URINALYSIS  COMPLETEWITH MICROSCOPIC ([facility] ONLY) - Abnormal; Notable for the following:    Color, Urine YELLOW (*)    APPearance CLOUDY (*)    Ketones, ur TRACE (*)    Hgb urine dipstick 2+ (*)    pH 9.0 (*)    Bacteria, UA RARE (*)    All other components within normal limits   ____________________________________________  EKG  None ____________________________________________  RADIOLOGY  CT abdomen and pelvis: Mild left-sided hydronephrosis with an obstructing 5 mm stone at the distal left ureter, 5 cm above the left vesicoureteral junction, small bilateral renal cysts seen, 1.3 cm nonspecific hypodensity within the right hepatic lobe. ____________________________________________   PROCEDURES  Procedure(s) performed: None  Critical Care performed: No  ____________________________________________   INITIAL IMPRESSION / ASSESSMENT AND PLAN / ED COURSE  Pertinent labs & imaging results that were available during my care of the patient were reviewed by me and considered in my medical decision making (see chart for details).  This is a 50 year old male who comes into the hospital with some severe left lower quadrant abdominal pain. At this time my thought is that the patient may have some diverticulitis. I will give the patient some Zofran, morphine as well as normal saline and I will order some blood work and a CT scan for the patient. I will reassess the patient once I received the results of the blood work as well as a CT scan.  The patient received a second dose of morphine because he was having pain. Once it was discovered that the patient had kidney stones I gave him a dose of Toradol and his pain improved. I  also gave him an oral dose of Percocet. The patient be discharged to home with a strainer to follow-up with urology if his pain should persist. The patient feels comfortable going home and he is not having any further nausea. I have encouraged the patient to hydrate and follow up. ____________________________________________   FINAL CLINICAL IMPRESSION(S) / ED DIAGNOSES  Final diagnoses:  Kidney stone      Loney Hering, MD 08/04/15 [phone]

## 2015-08-07 ENCOUNTER — Ambulatory Visit (INDEPENDENT_AMBULATORY_CARE_PROVIDER_SITE_OTHER): Payer: BLUE CROSS/BLUE SHIELD | Admitting: Urology

## 2015-08-07 ENCOUNTER — Encounter: Payer: Self-pay | Admitting: Urology

## 2015-08-07 VITALS — BP 139/75 | HR 71 | Ht 71.0 in | Wt 165.6 lb

## 2015-08-07 DIAGNOSIS — N1330 Unspecified hydronephrosis: Secondary | ICD-10-CM | POA: Diagnosis not present

## 2015-08-07 DIAGNOSIS — N201 Calculus of ureter: Secondary | ICD-10-CM | POA: Diagnosis not present

## 2015-08-07 DIAGNOSIS — N401 Enlarged prostate with lower urinary tract symptoms: Secondary | ICD-10-CM | POA: Diagnosis not present

## 2015-08-07 DIAGNOSIS — R3129 Other microscopic hematuria: Secondary | ICD-10-CM | POA: Diagnosis not present

## 2015-08-07 DIAGNOSIS — N138 Other obstructive and reflux uropathy: Secondary | ICD-10-CM | POA: Insufficient documentation

## 2015-08-07 LAB — MICROSCOPIC EXAMINATION: Bacteria, UA: NONE SEEN

## 2015-08-07 LAB — URINALYSIS, COMPLETE
BILIRUBIN UA: NEGATIVE
Glucose, UA: NEGATIVE
NITRITE UA: NEGATIVE
PH UA: 5.5 (ref 5.0–7.5)
Specific Gravity, UA: 1.02 (ref 1.005–1.030)
UUROB: 0.2 mg/dL (ref 0.2–1.0)

## 2015-08-07 NOTE — Progress Notes (Signed)
08/07/2015 8:58 AM   Blake Molina Apr 14, 1966 [id number]  Referring provider: Jinny Sanders, MD [address] [place], [place] [address]  Chief Complaint  Patient presents with  . Nephrolithiasis    ER referral    HPI: Patient is a 50 year old Caucasian male who was seen in the emergency room for a ureteral stone and presents to [place] for follow-up.  Patient states that on 08/02/2015, he ate a very spicy meal and  had 2 alcoholic drinks.  2 hours later, he developed a left-sided abdominal pain.  The pain had the intensity of a 10/10. It lasted for 2-1/2 hours and then abated.  He felt the pain was due to the spicy foods.    The next day, he had no left abdominal pain but he "felt off" all day long.  He then had [place] food that evening and developed the same left-sided abdominal pain.  The pain was 10/10.  The pain did not go away and patient sought treatment in the emergency room.    In the ED, he underwent a CT scan of the abdomen and pelvis with contrast. He was found to have mild left-sided hydronephrosis with an obstructing 5 mm stone at the distal left ureter. They also found small bilateral renal cysts. And a mildly enlarged prostate.  Since the ED visit, he has had twinges of left-sided abdominal pain.  He did spontaneously pass a stone last evening.  He brought it in today and we will send it for analysis.  Patient does not have a prior history of kidney stone disease.  He states his paternal grandfather had a history of nephrolithiasis.    Patient's father has had prostate issues, but not prostate cancer.  Patient's UA today is positive for greater than 30 RBC's per high-power field.  He is pain free today.     PMH: Past Medical History  Diagnosis Date  . Other chest pain   . Congenital hypertrophic pyloric stenosis   . Unspecified asthma(493.90)   . Allergic rhinitis, cause unspecified   . Esophageal reflux   . Panic disorder without agoraphobia   .  History of kidney stones   . Heart burn     Surgical History: Past Surgical History  Procedure Laterality Date  . Stenosis surgery (other)  1960s    hx of pyloric stenosis as infant  . Herniated disc-back surgery  1996    Home Medications:    Medication List       This list is accurate as of: 08/07/15  8:58 AM.  Always use your most recent med list.               ADVIL 200 MG Caps  Generic drug:  Ibuprofen  Take by mouth as needed.     albuterol 108 (90 Base) MCG/ACT inhaler  Commonly known as:  PROVENTIL HFA;VENTOLIN HFA  Inhale 2 puffs into the lungs every 6 (six) hours as needed for wheezing.     albuterol 90 MCG/ACT inhaler  Commonly known as:  PROVENTIL,VENTOLIN  Inhale 1-2 puffs into the lungs as needed.     diphenoxylate-atropine 2.5-0.025 MG tablet  Commonly known as:  LOMOTIL  Take 1 tablet by mouth 4 (four) times daily as needed. Reported on 08/07/2015     mometasone 220 MCG/INH inhaler  Commonly known as:  ASMANEX 14 METERED DOSES  Inhale 1 puff into the lungs every morning.     omeprazole 40 MG capsule  Commonly known  as:  PRILOSEC  Take 40 mg by mouth daily.     ondansetron 4 MG disintegrating tablet  Commonly known as:  ZOFRAN ODT  Take 1 tablet (4 mg total) by mouth every 8 (eight) hours as needed for nausea or vomiting.     oxyCODONE-acetaminophen 5-325 MG tablet  Commonly known as:  ROXICET  Take 1 tablet by mouth every 6 (six) hours as needed.     promethazine 25 MG tablet  Commonly known as:  PHENERGAN  Take 25 mg by mouth every 6 (six) hours as needed. Reported on 08/07/2015     tamsulosin 0.4 MG Caps capsule  Commonly known as:  FLOMAX  Take 1 capsule (0.4 mg total) by mouth daily.        Allergies: No Known Allergies  Family History: Family History  Problem Relation Age of Onset  . Coronary artery disease Father     stent age 68  . Hypertension Father   . Allergies Mother   . Asthma Brother   . Allergies Brother   .  Hypertension Paternal Grandfather   . Kidney disease Neg Hx   . Prostate cancer Neg Hx     Social History:  reports that he has never smoked. He does not have any smokeless tobacco history on file. He reports that he drinks alcohol. He reports that he does not use illicit drugs.  ROS: UROLOGY Frequent Urination?: No Hard to postpone urination?: No Burning/pain with urination?: No Get up at night to urinate?: No Leakage of urine?: No Urine stream starts and stops?: No Trouble starting stream?: No Do you have to strain to urinate?: No Blood in urine?: No Urinary tract infection?: No Sexually transmitted disease?: No Injury to kidneys or bladder?: No Painful intercourse?: No Weak stream?: No Erection problems?: No Penile pain?: No  Gastrointestinal Nausea?: Yes Vomiting?: No Indigestion/heartburn?: Yes Diarrhea?: No Constipation?: Yes  Constitutional Fever: No Night sweats?: Yes Weight loss?: No Fatigue?: No  Skin Skin rash/lesions?: No Itching?: No  Eyes Blurred vision?: No Double vision?: No  Ears/Nose/Throat Sore throat?: No Sinus problems?: No  Hematologic/Lymphatic Swollen glands?: No Easy bruising?: No  Cardiovascular Leg swelling?: No Chest pain?: No  Respiratory Cough?: No Shortness of breath?: No  Endocrine Excessive thirst?: No  Musculoskeletal Back pain?: Yes Joint pain?: No  Neurological Headaches?: No Dizziness?: No  Psychologic Depression?: No Anxiety?: No  Physical Exam: BP 139/75 mmHg  Pulse 71  Ht 5\' 11"  (1.803 m)  Wt 165 lb 9.6 oz (75.116 kg)  BMI 23.11 kg/m2  Constitutional: Well nourished. Alert and oriented, No acute distress. HEENT: [place] AT, moist mucus membranes. Trachea midline, no masses. Cardiovascular: No clubbing, cyanosis, or edema. Respiratory: Normal respiratory effort, no increased work of breathing. GI: Abdomen is soft, non tender, non distended, no abdominal masses. Liver and spleen not palpable.   No hernias appreciated.  Stool sample for occult testing is not indicated.   Skin: No rashes, bruises or suspicious lesions. Lymph: No cervical or inguinal adenopathy. Neurologic: Grossly intact, no focal deficits, moving all 4 extremities. Psychiatric: Normal mood and affect.  Laboratory Data: Lab Results  Component Value Date   WBC 12.7* 08/04/2015   HGB 14.5 08/04/2015   HCT 43.4 08/04/2015   MCV 85.7 08/04/2015   PLT 289 08/04/2015    Lab Results  Component Value Date   CREATININE 1.08 08/04/2015    Lab Results  Component Value Date   TSH 0.83 12/25/2008       Component Value  Date/Time   CHOL 138 12/25/2008 1007   HDL 36.10* 12/25/2008 1007   CHOLHDL 4 12/25/2008 1007   VLDL 8.2 12/25/2008 1007   LDLCALC 94 12/25/2008 1007    Lab Results  Component Value Date   AST 26 08/04/2015   Lab Results  Component Value Date   ALT 21 08/04/2015     Urinalysis Results for orders placed or performed in visit on 08/07/15  Microscopic Examination  Result Value Ref Range   WBC, UA 11-30 (A) 0 -  5 /hpf   RBC, UA >30 (A) 0 -  2 /hpf   Epithelial Cells (non renal) 0-10 0 - 10 /hpf   Casts Present (A) None seen /lpf   Cast Type Hyaline casts N/A   Mucus, UA Present (A) Not Estab.   Bacteria, UA None seen None seen/Few  Urinalysis, Complete  Result Value Ref Range   Specific Gravity, UA 1.020 1.005 - 1.030   pH, UA 5.5 5.0 - 7.5   Color, UA Yellow Yellow   Appearance Ur Hazy (A) Clear   Leukocytes, UA Trace (A) Negative   Protein, UA 1+ (A) Negative/Trace   Glucose, UA Negative Negative   Ketones, UA 2+ (A) Negative   RBC, UA 2+ (A) Negative   Bilirubin, UA Negative Negative   Urobilinogen, Ur 0.2 0.2 - 1.0 mg/dL   Nitrite, UA Negative Negative   Microscopic Examination See below:     Pertinent Imaging: CLINICAL DATA: Acute onset of left lower quadrant abdominal pain. Initial encounter.  EXAM: CT ABDOMEN AND PELVIS WITH  CONTRAST  TECHNIQUE: Multidetector CT imaging of the abdomen and pelvis was performed using the standard protocol following bolus administration of intravenous contrast.  CONTRAST: 143mL OMNIPAQUE IOHEXOL 300 MG/ML SOLN  COMPARISON: None.  FINDINGS: The visualized lung bases are clear.  A 1.3 cm hypodensity is noted within the right hepatic lobe. The spleen is unremarkable in appearance. The gallbladder is within normal limits. The pancreas and adrenal glands are unremarkable.  Mild left-sided hydronephrosis is noted, with left-sided perinephric stranding and fluid, and an obstructing 5 mm stone noted at the distal left ureter, 5 cm above the left vesicoureteral junction. Minimal right-sided perinephric stranding is noted. Small bilateral renal cysts are seen.  No nonobstructing renal stones are identified.  No free fluid is identified. The small bowel is unremarkable in appearance. The stomach is within normal limits. No acute vascular abnormalities are seen.  The appendix is normal in caliber, without evidence of appendicitis. The colon is unremarkable in appearance.  The bladder is mildly distended and grossly unremarkable. The prostate is mildly enlarged, measuring 5.1 cm in transverse dimension. No inguinal lymphadenopathy is seen.  No acute osseous abnormalities are identified. Endplate sclerotic change is noted at L5-S1.  IMPRESSION: 1. Mild left-sided hydronephrosis, with an obstructing 5 mm stone at the distal left ureter, 5 cm above the left vesicoureteral junction. 2. Small bilateral renal cysts seen. 3. 1.3 cm nonspecific hypodensity within the right hepatic lobe. Would correlate with LFTs. 4. Mildly enlarged prostate.   Electronically Signed  By: Garald Balding M.D.  On: 08/04/2015 05:29   Assessment & Plan:    1. Left ureteral stone:    Patient passed the stone spontaneously.   We will send it for analysis.    - Urinalysis,  Complete - CULTURE, URINE COMPREHENSIVE  2. Left hydronephrosis:   Patient passed the stone spontaneously.  We will schedule a RUS in one month to ensure the hydronephrosis has resolved.  3. Microscopic hematuria:   Patient's UA had > 30 RBC's/hpf on today's exam.  We will continue to monitor. When he returns in 1 month, we will repeat the UA to ensure the hematuria has resolved.  4. BPH with LUTS:     Patient found to have an enlarged prostate with a distended bladder  on CT.  He will be returning in one month for an exam, PSA, PVR and IPSS score.   Return in about 1 month (around 09/04/2015) for RUS report, PSA, IPSS score, UA, exam and PVR.  These notes generated with voice recognition software. I apologize for typographical errors.  Zara Council, [facility] [address] [place], [place] [address] [phone]

## 2015-08-10 LAB — CULTURE, URINE COMPREHENSIVE

## 2015-08-15 ENCOUNTER — Other Ambulatory Visit: Payer: Self-pay | Admitting: Urology

## 2015-09-02 ENCOUNTER — Ambulatory Visit
Admission: RE | Admit: 2015-09-02 | Discharge: 2015-09-02 | Disposition: A | Discharge status: Home / Self Care | Payer: BLUE CROSS/BLUE SHIELD | Source: Ambulatory Visit | Attending: Urology | Admitting: Urology

## 2015-09-02 DIAGNOSIS — N1330 Unspecified hydronephrosis: Secondary | ICD-10-CM | POA: Diagnosis present

## 2015-09-04 ENCOUNTER — Ambulatory Visit: Payer: BLUE CROSS/BLUE SHIELD | Admitting: Urology

## 2015-09-11 ENCOUNTER — Encounter: Payer: Self-pay | Admitting: Urology

## 2015-09-11 ENCOUNTER — Ambulatory Visit (INDEPENDENT_AMBULATORY_CARE_PROVIDER_SITE_OTHER): Payer: BLUE CROSS/BLUE SHIELD | Admitting: Urology

## 2015-09-11 VITALS — BP 113/72 | HR 60 | Ht 71.0 in | Wt 160.8 lb

## 2015-09-11 DIAGNOSIS — R3129 Other microscopic hematuria: Secondary | ICD-10-CM

## 2015-09-11 DIAGNOSIS — N400 Enlarged prostate without lower urinary tract symptoms: Secondary | ICD-10-CM

## 2015-09-11 DIAGNOSIS — N1330 Unspecified hydronephrosis: Secondary | ICD-10-CM | POA: Diagnosis not present

## 2015-09-11 DIAGNOSIS — N201 Calculus of ureter: Secondary | ICD-10-CM

## 2015-09-11 LAB — URINALYSIS, COMPLETE
Bilirubin, UA: NEGATIVE
Glucose, UA: NEGATIVE
Ketones, UA: NEGATIVE
Leukocytes, UA: NEGATIVE
NITRITE UA: NEGATIVE
PH UA: 7 (ref 5.0–7.5)
Protein, UA: NEGATIVE
RBC, UA: NEGATIVE
Specific Gravity, UA: 1.015 (ref 1.005–1.030)
UUROB: 0.2 mg/dL (ref 0.2–1.0)

## 2015-09-11 LAB — MICROSCOPIC EXAMINATION
BACTERIA UA: NONE SEEN
Epithelial Cells (non renal): NONE SEEN /hpf (ref 0–10)
RBC MICROSCOPIC, UA: NONE SEEN /HPF (ref 0–?)

## 2015-09-11 NOTE — Progress Notes (Signed)
.    4:17 PM   Blake Molina 09-03-1965 [id number]  Referring provider: Jinny Sanders, MD [address] [place], [place] [address]  Chief Complaint  Patient presents with  . Results    RUS    HPI: Patient is a 50 year old Caucasian male who presents today for a renal ultrasound report, stone analysis report, UA check, prostate exam and PSA.   Patient was s and evaluated on 08/04/2015  [facility] ED for an obstructing 5 mm stone at the distal left ureter causing hydronephrosis. There was also incidental findings of bilateral renal cysts and a mildly enlarged prostate.   He was also found to have rater than 30 RBCs on his urinalysis at that time.  He has since passed the stone and we have sent it off for analysis.  Patient does not have a prior history of kidney stone disease.  He states his paternal grandfather had a history of nephrolithiasis.   Renal ultrasound performed on 09/02/2015 noted that the hydronephrosis had resolved. He did his have 2 right upper pole renal cysts, but otherwise the sonographic appearance of the kidneys and the urinary bladder is unremarkable. I personally reviewed the films with the patient.   Patient's stone analysis noted a calcium oxalate monohydrate composition of 98% and a calcium phosphate carbonate of 2%.  His UA did not demonstrate hematuria. He does not endorse any episodes of gross hematuria.  He has not had any further flank pain.    Patient had an incidental finding of a mildly enlarged prostate on CT scan. This was concerning for the patient and he wanted further follow-up for this finding. His I PSS score is 0/0.  Patient's father has had prostate issues, but not prostate cancer.  We have drawn a PSA today.  PMH: Past Medical History  Diagnosis Date  . Other chest pain   . Congenital hypertrophic pyloric stenosis   . Unspecified asthma(493.90)   . Allergic rhinitis, cause unspecified   . Esophageal reflux   . Panic disorder without  agoraphobia   . History of kidney stones   . Heart burn   . BPH without obstruction/lower urinary tract symptoms     Surgical History: Past Surgical History  Procedure Laterality Date  . Stenosis surgery (other)  1960s    hx of pyloric stenosis as infant  . Herniated disc-back surgery  1996    Home Medications:    Medication List       This list is accurate as of: 09/11/15  4:17 PM.  Always use your most recent med list.               ADVIL 200 MG Caps  Generic drug:  Ibuprofen  Take by mouth as needed.     albuterol 108 (90 Base) MCG/ACT inhaler  Commonly known as:  PROVENTIL HFA;VENTOLIN HFA  Inhale 2 puffs into the lungs every 6 (six) hours as needed for wheezing.     albuterol 90 MCG/ACT inhaler  Commonly known as:  PROVENTIL,VENTOLIN  Inhale 1-2 puffs into the lungs as needed.     diphenoxylate-atropine 2.5-0.025 MG tablet  Commonly known as:  LOMOTIL  Take 1 tablet by mouth 4 (four) times daily as needed. Reported on 09/11/2015     mometasone 220 MCG/INH inhaler  Commonly known as:  ASMANEX 14 METERED DOSES  Inhale 1 puff into the lungs every morning.     omeprazole 40 MG capsule  Commonly known as:  PRILOSEC  Take  40 mg by mouth daily.     ondansetron 4 MG disintegrating tablet  Commonly known as:  ZOFRAN ODT  Take 1 tablet (4 mg total) by mouth every 8 (eight) hours as needed for nausea or vomiting.     oxyCODONE-acetaminophen 5-325 MG tablet  Commonly known as:  ROXICET  Take 1 tablet by mouth every 6 (six) hours as needed.     promethazine 25 MG tablet  Commonly known as:  PHENERGAN  Take 25 mg by mouth every 6 (six) hours as needed. Reported on 09/11/2015     tamsulosin 0.4 MG Caps capsule  Commonly known as:  FLOMAX  Take 1 capsule (0.4 mg total) by mouth daily.        Allergies: No Known Allergies  Family History: Family History  Problem Relation Age of Onset  . Coronary artery disease Father     stent age 46  . Hypertension Father    . Allergies Mother   . Asthma Brother   . Allergies Brother   . Hypertension Paternal Grandfather   . Kidney disease Neg Hx   . Prostate cancer Neg Hx     Social History:  reports that he has never smoked. He does not have any smokeless tobacco history on file. He reports that he drinks alcohol. He reports that he does not use illicit drugs.  ROS: UROLOGY Frequent Urination?: No Hard to postpone urination?: No Burning/pain with urination?: No Get up at night to urinate?: No Leakage of urine?: No Urine stream starts and stops?: No Trouble starting stream?: No Do you have to strain to urinate?: No Blood in urine?: No Urinary tract infection?: No Sexually transmitted disease?: No Injury to kidneys or bladder?: No Painful intercourse?: No Weak stream?: No Erection problems?: No Penile pain?: No  Gastrointestinal Nausea?: No Vomiting?: No Indigestion/heartburn?: No Diarrhea?: No Constipation?: No  Constitutional Fever: No Night sweats?: No Weight loss?: No Fatigue?: No  Skin Skin rash/lesions?: No Itching?: No  Eyes Blurred vision?: No Double vision?: No  Ears/Nose/Throat Sore throat?: No Sinus problems?: No  Hematologic/Lymphatic Swollen glands?: No Easy bruising?: No  Cardiovascular Leg swelling?: No Chest pain?: No  Respiratory Cough?: No Shortness of breath?: No  Endocrine Excessive thirst?: No  Musculoskeletal Back pain?: No Joint pain?: No  Neurological Headaches?: No Dizziness?: No  Psychologic Depression?: No Anxiety?: No  Physical Exam: BP 113/72 mmHg  Pulse 60  Ht 5\' 11"  (1.803 m)  Wt 160 lb 12.8 oz (72.938 kg)  BMI 22.44 kg/m2  Constitutional: Well nourished. Alert and oriented, No acute distress. HEENT: [place] AT, moist mucus membranes. Trachea midline, no masses. Cardiovascular: No clubbing, cyanosis, or edema. Respiratory: Normal respiratory effort, no increased work of breathing. GI: Abdomen is soft, non tender, non  distended, no abdominal masses. Liver and spleen not palpable.  No hernias appreciated.  Stool sample for occult testing is not indicated.   GU: No CVA tenderness.  No bladder fullness or masses.  Patient with circumcised phallus.   Urethral meatus is patent.  No penile discharge. No penile lesions or rashes. Scrotum without lesions, cysts, rashes and/or edema.  Testicles are located scrotally bilaterally. No masses are appreciated in the testicles. Left and right epididymis are normal. Rectal: Patient with  normal sphincter tone. Anus and perineum without scarring or rashes. No rectal masses are appreciated. Prostate is approximately 45 grams, no nodules are appreciated. Seminal vesicles are normal. Skin: No rashes, bruises or suspicious lesions. Lymph: No cervical or inguinal adenopathy. Neurologic: Grossly  intact, no focal deficits, moving all 4 extremities. Psychiatric: Normal mood and affect.  Laboratory Data: Lab Results  Component Value Date   WBC 12.7* 08/04/2015   HGB 14.5 08/04/2015   HCT 43.4 08/04/2015   MCV 85.7 08/04/2015   PLT 289 08/04/2015    Lab Results  Component Value Date   CREATININE 1.08 08/04/2015    Lab Results  Component Value Date   TSH 0.83 12/25/2008       Component Value Date/Time   CHOL 138 12/25/2008 1007   HDL 36.10* 12/25/2008 1007   CHOLHDL 4 12/25/2008 1007   VLDL 8.2 12/25/2008 1007   LDLCALC 94 12/25/2008 1007    Lab Results  Component Value Date   AST 26 08/04/2015   Lab Results  Component Value Date   ALT 21 08/04/2015     Urinalysis Results for orders placed or performed in visit on 09/11/15  Microscopic Examination  Result Value Ref Range   WBC, UA 0-5 0 -  5 /hpf   RBC, UA None seen 0 -  2 /hpf   Epithelial Cells (non renal) None seen 0 - 10 /hpf   Bacteria, UA None seen None seen/Few  Urinalysis, Complete  Result Value Ref Range   Specific Gravity, UA 1.015 1.005 - 1.030   pH, UA 7.0 5.0 - 7.5   Color, UA Yellow  Yellow   Appearance Ur Clear Clear   Leukocytes, UA Negative Negative   Protein, UA Negative Negative/Trace   Glucose, UA Negative Negative   Ketones, UA Negative Negative   RBC, UA Negative Negative   Bilirubin, UA Negative Negative   Urobilinogen, Ur 0.2 0.2 - 1.0 mg/dL   Nitrite, UA Negative Negative   Microscopic Examination See below:     Pertinent Imaging: CLINICAL DATA: Left hydronephrosis  EXAM: RENAL / URINARY TRACT ULTRASOUND COMPLETE  COMPARISON: 08/04/2015  FINDINGS: Right Kidney:  Length: 10.3 cm. Echogenicity within normal limits. No mass or hydronephrosis visualized. Two simple appearing right kidney upper pole cysts are present, 1 measuring 0.8 by 1.1 by 1.3 cm and the other measuring 0.7 by 0.6 by 0.8 cm.  Left Kidney:  Length: 11.4 cm. Echogenicity within normal limits. No mass or hydronephrosis visualized.  Bladder:  Appears normal for degree of bladder distention.  IMPRESSION: 1. No significant abnormality identified. There are 2 right kidney upper pole cysts, but otherwise the sonographic appearance of the kidneys and urinary bladder is unremarkable.   Electronically Signed  By: Van Clines M.D.  On: 09/02/2015 16:28   Assessment & Plan:    1. Left ureteral stone:    Patient spontaneously passed a left ureteral stone.  No other stones are seen on the CT completed February 20th 2017.  Patient does not have a prior history of nephrolithiasis.  He is not interested in pursuing a 24-hour urine at this time.  He will follow-up in one year for KUB and UA.  - Urinalysis, Complete  2. Left hydronephrosis:   RUS completed on 09/02/2015 has noted that the hydronephrosis has resolved.  3. Microscopic hematuria:   Patient's UA did not demonstrate hematuria on today's exam.  We will continue to monitor.  He will return in 1 year for UA. Patient will contact us if he develops any episodes of gross hematuria in the interim.  4.  BPH with LUTS:     IPSS score is 0/0.  Marland Kitchen  We will continue to monitor.  He will have his IPSS score, exam and PSA  in 12 months.  - PSA  Return in about 1 year (around 09/10/2016) for KUB, UA, exam and IPSS score.  These notes generated with voice recognition software. I apologize for typographical errors.  Zara Council, [facility] [address] [place], [place] [address] [phone]

## 2015-09-12 LAB — PSA: Prostate Specific Ag, Serum: 0.8 ng/mL (ref 0.0–4.0)

## 2015-09-15 ENCOUNTER — Telehealth: Payer: Self-pay

## 2015-09-15 NOTE — Telephone Encounter (Signed)
Spoke with pt wife, Curt Bears, in reference to PSA results. Wife voiced understanding.

## 2015-09-15 NOTE — Telephone Encounter (Signed)
-----   Message from Nori Riis, PA-C sent at 09/12/2015  8:25 AM EDT ----- PSA is stable.  We will see him in one year.

## 2015-12-31 ENCOUNTER — Encounter: Payer: Self-pay | Admitting: Emergency Medicine

## 2015-12-31 ENCOUNTER — Ambulatory Visit
Admission: EM | Admit: 2015-12-31 | Discharge: 2015-12-31 | Disposition: A | Discharge status: Home / Self Care | Payer: BLUE CROSS/BLUE SHIELD | Attending: Family Medicine | Admitting: Family Medicine

## 2015-12-31 DIAGNOSIS — M7022 Olecranon bursitis, left elbow: Secondary | ICD-10-CM | POA: Diagnosis not present

## 2015-12-31 DIAGNOSIS — J4530 Mild persistent asthma, uncomplicated: Secondary | ICD-10-CM | POA: Diagnosis not present

## 2015-12-31 MED ORDER — ALBUTEROL SULFATE HFA 108 (90 BASE) MCG/ACT IN AERS: 1.0000 | INHALATION_SPRAY | Freq: Four times a day (QID) | RESPIRATORY_TRACT | Status: DC | PRN

## 2015-12-31 MED ORDER — MOMETASONE FUROATE 220 MCG/INH IN AEPB: 2.0000 | INHALATION_SPRAY | Freq: Every day | RESPIRATORY_TRACT | Status: DC

## 2015-12-31 NOTE — ED Notes (Signed)
Patient c/o tenderness and swelling in his left elbow that started a week ago.  Patient reports that it got worse yesterday.  Patient denies injury.

## 2015-12-31 NOTE — ED Provider Notes (Signed)
CSN: [id number]     Arrival date & time 12/31/15  [id number] History   First MD Initiated Contact with Patient 12/31/15 [phone]     Chief Complaint  Patient presents with  . Elbow Pain   (Consider location/radiation/quality/duration/timing/severity/associated sxs/prior Treatment) HPI Comments: 50 yo male with a c/o 1 week of left elbow intermittent tenderness and swelling. Has been taking ibuprofen with intermittent relief. States today is better (less swelling) and not having any pain today. Also requesting refill on his asthma inhalers. States his asthma is mild but persistent and uses daily steroid inhaler; patient states he is in between transferring to new PCP.   The history is provided by the patient.    Past Medical History  Diagnosis Date  . Other chest pain   . Congenital hypertrophic pyloric stenosis   . Unspecified asthma(493.90)   . Allergic rhinitis, cause unspecified   . Esophageal reflux   . Panic disorder without agoraphobia   . History of kidney stones   . Heart burn   . BPH without obstruction/lower urinary tract symptoms    Past Surgical History  Procedure Laterality Date  . Stenosis surgery (other)  1960s    hx of pyloric stenosis as infant  . Herniated disc-back surgery  1996   Family History  Problem Relation Age of Onset  . Coronary artery disease Father     stent age 35  . Hypertension Father   . Allergies Mother   . Asthma Brother   . Allergies Brother   . Hypertension Paternal Grandfather   . Kidney disease Neg Hx   . Prostate cancer Neg Hx    Social History  Substance Use Topics  . Smoking status: Never Smoker   . Smokeless tobacco: None  . Alcohol Use: Yes     Comment: Occasionally, maybe 1/month     Review of Systems  Allergies  Review of patient's allergies indicates no known allergies.  Home Medications   Prior to Admission medications   Medication Sig Start Date End Date Taking? Authorizing Provider  albuterol (PROVENTIL HFA;VENTOLIN  HFA) 108 (90 Base) MCG/ACT inhaler Inhale 1-2 puffs into the lungs every 6 (six) hours as needed for wheezing or shortness of breath. 12/31/15   Norval Gable, MD  albuterol (PROVENTIL,VENTOLIN) 90 MCG/ACT inhaler Inhale 1-2 puffs into the lungs as needed. 02/17/11   Amy Cletis Athens, MD  diphenoxylate-atropine (LOMOTIL) 2.5-0.025 MG per tablet Take 1 tablet by mouth 4 (four) times daily as needed. Reported on 09/11/2015    Historical Provider, MD  Ibuprofen (ADVIL) 200 MG CAPS Take by mouth as needed.      Historical Provider, MD  mometasone [facility]) 220 MCG/INH inhaler Inhale 2 puffs into the lungs daily. 12/31/15   Norval Gable, MD  omeprazole (PRILOSEC) 40 MG capsule Take 40 mg by mouth daily.      Historical Provider, MD  ondansetron (ZOFRAN ODT) 4 MG disintegrating tablet Take 1 tablet (4 mg total) by mouth every 8 (eight) hours as needed for nausea or vomiting. Patient not taking: Reported on 09/11/2015 08/04/15   Loney Hering, MD  oxyCODONE-acetaminophen (ROXICET) 5-325 MG tablet Take 1 tablet by mouth every 6 (six) hours as needed. Patient not taking: Reported on 08/07/2015 08/04/15   Loney Hering, MD  promethazine (PHENERGAN) 25 MG tablet Take 25 mg by mouth every 6 (six) hours as needed. Reported on 09/11/2015    Historical Provider, MD  tamsulosin (FLOMAX) 0.4 MG CAPS capsule Take 1 capsule (0.4 mg total)  by mouth daily. Patient not taking: Reported on 09/11/2015 08/04/15   Loney Hering, MD   Meds Ordered and Administered this Visit  Medications - No data to display  BP 137/82 mmHg  Pulse 68  Temp(Src) 97.5 F (36.4 C) (Tympanic)  Resp 16  Ht 5\' 11"  (1.803 m)  Wt 160 lb (72.576 kg)  BMI 22.33 kg/m2  SpO2 100% No data found.   Physical Exam  Constitutional: He appears well-developed and well-nourished. No distress.  Musculoskeletal:       Left elbow: He exhibits swelling (mild skin edema over olecranon process). He exhibits normal range of motion, no effusion, no  deformity and no laceration.  Skin: He is not diaphoretic.  Nursing note and vitals reviewed.   ED Course  Procedures (including critical care time)  Labs Review Labs Reviewed - No data to display  Imaging Review No results found.   Visual Acuity Review  Right Eye Distance:   Left Eye Distance:   Bilateral Distance:    Right Eye Near:   Left Eye Near:    Bilateral Near:         MDM   1. Olecranon bursitis, left   2. Asthma, mild persistent, uncomplicated    Discharge Medication List as of 12/31/2015  8:54 AM    1.  diagnosis reviewed with patient 2. rx as per orders above; reviewed possible side effects, interactions, risks and benefits; refilled asthma inhalers 3. Recommend supportive treatment with otc NSAIDS for bursitis, rest, ice 4. Follow-up prn if symptoms worsen or don't improve    Norval Gable, MD 12/31/15 1242

## 2015-12-31 NOTE — Discharge Instructions (Signed)
Elbow Bursitis  Elbow bursitis is inflammation of the fluid-filled sac (bursa) between the tip of your elbow bone (olecranon) and your skin. Elbow bursitis may also be called olecranon bursitis.  Normally, the olecranon bursa has only a small amount of fluid in it to cushion and protect your elbow bone. Elbow bursitis causes fluid to build up inside the bursa. Over time, this swelling and inflammation can cause pain when you bend or lean on your elbow.   CAUSES  Elbow bursitis may be caused by:    Elbow injury (acute trauma).   Leaning on hard surfaces for long periods of time.   Infection from an injury that breaks the skin near your elbow.   A bone growth (spur) that forms at the tip of your elbow.   A medical condition that causes inflammation in your body, such as gout or rheumatoid arthritis.   The cause may also be unknown.   SIGNS AND SYMPTOMS   The first sign of elbow bursitis is usually swelling over the tip of your elbow. This can grow to be the size of a golf ball. This may start suddenly or develop gradually. You may also have:   Pain when bending or leaning on your elbow.   Restricted movement of your elbow.   If your bursitis is caused by an infection, symptoms may also include:   Redness, warmth, and tenderness of the elbow.   Drainage of pus from the swollen area over your elbow, if the skin breaks open.  DIAGNOSIS   Your health care provider may be able to diagnose elbow bursitis based on your signs and symptoms, especially if you have recently been injured. Your health care provider will also do a physical exam. This may include:   X-rays to look for a bone spur or a bone fracture.   Draining fluid from the bursa to test it for infection.   Blood tests to rule out gout or rheumatoid arthritis.  TREATMENT   Treatment for elbow bursitis depends on the cause. Treatment may include:   Medicines. These may include:    Over-the-counter medicines to relieve pain and inflammation.     Antibiotic medicines to fight infection.    Injections of anti-inflammatory medicines (steroids).   Wrapping your elbow with a bandage.   Draining fluid from the bursa.   Wearing elbow pads.   If your bursitis does not get better with treatment, surgery may be needed to remove the bursa.   HOME CARE INSTRUCTIONS    Take medicines only as directed by your health care provider.   If you were prescribed an antibiotic medicine, finish all of it even if you start to feel better.   If your bursitis is caused by an injury, rest your elbow and wear your bandage as directed by your health care provider. You may alsoapply ice to the injured area as directed by your health care provider:   Put ice in a plastic bag.   Place a towel between your skin and the bag.   Leave the ice on for 20 minutes, 2-3 times per day.   Avoid any activities that cause elbow pain.   Use elbow pads or elbow wraps to cushion your elbow.  SEEK MEDICAL CARE IF:   You have a fever.    Your symptoms do not get better with treatment.   Your pain or swelling gets worse.   Your elbow pain or swelling goes away and then returns.   You have   drainage of pus from the swollen area over your elbow.     This information is not intended to replace advice given to you by your health care provider. Make sure you discuss any questions you have with your health care provider.     Document Released: 06/30/2006 Document Revised: 06/21/2014 Document Reviewed: 02/06/2014  Elsevier Interactive Patient Education 2016 [employer] Inc[employer]

## 2016-03-23 DIAGNOSIS — H52223 Regular astigmatism, bilateral: Secondary | ICD-10-CM | POA: Diagnosis not present

## 2016-03-23 DIAGNOSIS — H5202 Hypermetropia, left eye: Secondary | ICD-10-CM | POA: Diagnosis not present

## 2016-03-23 DIAGNOSIS — H524 Presbyopia: Secondary | ICD-10-CM | POA: Diagnosis not present

## 2016-09-06 ENCOUNTER — Other Ambulatory Visit: Payer: Self-pay | Admitting: Family Medicine

## 2016-09-06 ENCOUNTER — Ambulatory Visit
Admission: RE | Admit: 2016-09-06 | Discharge: 2016-09-06 | Disposition: A | Discharge status: Home / Self Care | Payer: BLUE CROSS/BLUE SHIELD | Source: Ambulatory Visit | Attending: Urology | Admitting: Urology

## 2016-09-06 ENCOUNTER — Other Ambulatory Visit: Payer: BLUE CROSS/BLUE SHIELD

## 2016-09-06 DIAGNOSIS — N201 Calculus of ureter: Secondary | ICD-10-CM

## 2016-09-06 DIAGNOSIS — N401 Enlarged prostate with lower urinary tract symptoms: Secondary | ICD-10-CM | POA: Diagnosis not present

## 2016-09-06 DIAGNOSIS — R935 Abnormal findings on diagnostic imaging of other abdominal regions, including retroperitoneum: Secondary | ICD-10-CM | POA: Diagnosis not present

## 2016-09-07 LAB — PSA: PROSTATE SPECIFIC AG, SERUM: 0.9 ng/mL (ref 0.0–4.0)

## 2016-09-09 ENCOUNTER — Ambulatory Visit (INDEPENDENT_AMBULATORY_CARE_PROVIDER_SITE_OTHER): Payer: BLUE CROSS/BLUE SHIELD | Admitting: Urology

## 2016-09-09 ENCOUNTER — Encounter: Payer: Self-pay | Admitting: Urology

## 2016-09-09 VITALS — BP 122/75 | HR 67 | Ht 71.0 in | Wt 166.7 lb

## 2016-09-09 DIAGNOSIS — Z87442 Personal history of urinary calculi: Secondary | ICD-10-CM | POA: Diagnosis not present

## 2016-09-09 DIAGNOSIS — N401 Enlarged prostate with lower urinary tract symptoms: Secondary | ICD-10-CM | POA: Diagnosis not present

## 2016-09-09 DIAGNOSIS — N138 Other obstructive and reflux uropathy: Secondary | ICD-10-CM

## 2016-09-09 DIAGNOSIS — Z87448 Personal history of other diseases of urinary system: Secondary | ICD-10-CM

## 2016-09-09 LAB — URINALYSIS, COMPLETE
Bilirubin, UA: NEGATIVE
Glucose, UA: NEGATIVE
Ketones, UA: NEGATIVE
Leukocytes, UA: NEGATIVE
Nitrite, UA: NEGATIVE
PH UA: 6 (ref 5.0–7.5)
RBC, UA: NEGATIVE
Specific Gravity, UA: 1.025 (ref 1.005–1.030)
UUROB: 0.2 mg/dL (ref 0.2–1.0)

## 2016-09-09 LAB — MICROSCOPIC EXAMINATION
BACTERIA UA: NONE SEEN
RBC MICROSCOPIC, UA: NONE SEEN /HPF (ref 0–?)

## 2016-09-09 NOTE — Progress Notes (Signed)
.    3:49 PM   Blake Molina [birth year] [id number]  Referring provider: Jinny Sanders, MD [address] [place], [place] [address]  Chief Complaint  Patient presents with  . Nephrolithiasis    1 year follow up   . Benign Prostatic Hypertrophy    1 year follow up     HPI: Patient is a 51 year old Caucasian male who presents today for a one year follow up for a history of nephrolithiasis and BPH with LUTS.    History of nephrolithiasis Patient had spontaneously passed a left ureteral stone.  Renal ultrasound performed on 09/02/2015 noted no hydronephrosis.  Stone composition was calcium oxalate monohydrate composition of 98% and a calcium phosphate carbonate of 2%.  His UA did not demonstrate hematuria. He does not endorse any episodes of gross hematuria.  He has not had any further flank pain.    BPH WITH LUTS His IPSS score today is 1, which is mild lower urinary tract symptomatology. He is pleased with his quality life due to his urinary symptoms.  His previous IPSS score was 0/0.   His major complaint today nocturia x 1.  He has had these symptoms for several years.  He denies any dysuria, hematuria or suprapubic pain.   He also denies any recent fevers, chills, nausea or vomiting.   He does not have a family history of PCa.     IPSS    Row Name 09/09/16 1500         International Prostate Symptom Score   How often have you had the sensation of not emptying your bladder? Not at All     How often have you had to urinate less than every two hours? Not at All     How often have you found you stopped and started again several times when you urinated? Not at All     How often have you found it difficult to postpone urination? Not at All     How often have you had a weak urinary stream? Not at All     How often have you had to strain to start urination? Not at All     How many times did you typically get up at night to urinate? 1 Time     Total IPSS Score 1       Quality of Life due to urinary symptoms   If you were to spend the rest of your life with your urinary condition just the way it is now how would you feel about that? Pleased        Score:  1-7 Mild 8-19 Moderate 20-35 Severe     PMH: Past Medical History:  Diagnosis Date  . Allergic rhinitis, cause unspecified   . BPH without obstruction/lower urinary tract symptoms   . Congenital hypertrophic pyloric stenosis   . Esophageal reflux   . Heart burn   . History of kidney stones   . Other chest pain   . Panic disorder without agoraphobia   . Unspecified asthma(493.90)     Surgical History: Past Surgical History:  Procedure Laterality Date  . herniated disc-back surgery  1996  . Stenosis surgery (other)  1960s   hx of pyloric stenosis as infant    Home Medications:  Allergies as of 09/09/2016   No Known Allergies     Medication List       Accurate as of 09/09/16  3:49 PM. Always use your most recent med list.  ADVIL 200 MG Caps Generic drug:  Ibuprofen Take by mouth as needed.   albuterol 108 (90 Base) MCG/ACT inhaler Commonly known as:  PROVENTIL HFA;VENTOLIN HFA Inhale 1-2 puffs into the lungs every 6 (six) hours as needed for wheezing or shortness of breath.   albuterol 90 MCG/ACT inhaler Commonly known as:  PROVENTIL,VENTOLIN Inhale 1-2 puffs into the lungs as needed.   diphenoxylate-atropine 2.5-0.025 MG tablet Commonly known as:  LOMOTIL Take 1 tablet by mouth 4 (four) times daily as needed. Reported on 09/11/2015   meloxicam 7.5 MG tablet Commonly known as:  MOBIC Take 7.5 mg by mouth daily.   mometasone 220 MCG/INH inhaler Commonly known as:  ASMANEX Inhale 2 puffs into the lungs daily.   omeprazole 40 MG capsule Commonly known as:  PRILOSEC Take 40 mg by mouth daily.   ondansetron 4 MG disintegrating tablet Commonly known as:  ZOFRAN ODT Take 1 tablet (4 mg total) by mouth every 8 (eight) hours as needed for nausea or vomiting.    oxyCODONE-acetaminophen 5-325 MG tablet Commonly known as:  ROXICET Take 1 tablet by mouth every 6 (six) hours as needed.   promethazine 25 MG tablet Commonly known as:  PHENERGAN Take 25 mg by mouth every 6 (six) hours as needed. Reported on 09/11/2015   tamsulosin 0.4 MG Caps capsule Commonly known as:  FLOMAX Take 1 capsule (0.4 mg total) by mouth daily.       Allergies: No Known Allergies  Family History: Family History  Problem Relation Age of Onset  . Coronary artery disease Father     stent age 54  . Hypertension Father   . Allergies Mother   . Asthma Brother   . Allergies Brother   . Hypertension Paternal Grandfather   . Kidney disease Neg Hx   . Prostate cancer Neg Hx   . Kidney cancer Neg Hx   . Bladder Cancer Neg Hx     Social History:  reports that he has never smoked. He has never used smokeless tobacco. He reports that he drinks alcohol. He reports that he does not use drugs.  ROS: UROLOGY Frequent Urination?: No Hard to postpone urination?: No Burning/pain with urination?: No Get up at night to urinate?: No Leakage of urine?: No Urine stream starts and stops?: No Trouble starting stream?: No Do you have to strain to urinate?: No Blood in urine?: No Urinary tract infection?: No Sexually transmitted disease?: No Injury to kidneys or bladder?: No Painful intercourse?: No Weak stream?: No Erection problems?: No Penile pain?: No  Gastrointestinal Nausea?: No Vomiting?: No Indigestion/heartburn?: No Diarrhea?: No Constipation?: No  Constitutional Fever: No Night sweats?: No Weight loss?: No Fatigue?: No  Skin Skin rash/lesions?: No Itching?: No  Eyes Blurred vision?: No Double vision?: No  Ears/Nose/Throat Sore throat?: No Sinus problems?: No  Hematologic/Lymphatic Swollen glands?: No Easy bruising?: No  Cardiovascular Leg swelling?: No Chest pain?: No  Respiratory Cough?: No Shortness of breath?:  No  Endocrine Excessive thirst?: No  Musculoskeletal Back pain?: Yes Joint pain?: No  Neurological Headaches?: No Dizziness?: No  Psychologic Depression?: No Anxiety?: No  Physical Exam: BP 122/75   Pulse 67   Ht 5\' 11"  (1.803 m)   Wt 166 lb 11.2 oz (75.6 kg)   BMI 23.25 kg/m   Constitutional: Well nourished. Alert and oriented, No acute distress. HEENT: [place] AT, moist mucus membranes. Trachea midline, no masses. Cardiovascular: No clubbing, cyanosis, or edema. Respiratory: Normal respiratory effort, no increased work of breathing. GI:  Abdomen is soft, non tender, non distended, no abdominal masses. Liver and spleen not palpable.  No hernias appreciated.  Stool sample for occult testing is not indicated.   GU: No CVA tenderness.  No bladder fullness or masses.  Patient with circumcised phallus.   Urethral meatus is patent.  No penile discharge. No penile lesions or rashes. Scrotum without lesions, cysts, rashes and/or edema.  Testicles are located scrotally bilaterally. No masses are appreciated in the testicles. Left and right epididymis are normal. Rectal: Patient with  normal sphincter tone. Anus and perineum without scarring or rashes. No rectal masses are appreciated. Prostate is approximately 45 grams, no nodules are appreciated. Seminal vesicles are normal. Skin: No rashes, bruises or suspicious lesions. Lymph: No cervical or inguinal adenopathy. Neurologic: Grossly intact, no focal deficits, moving all 4 extremities. Psychiatric: Normal mood and affect.  Laboratory Data: PSA History  0.8 ng/dL on 09/11/2015  0.9 ng/dL on 09/06/2016   Lab Results  Component Value Date   WBC 12.7 (H) 08/04/2015   HGB 14.5 08/04/2015   HCT 43.4 08/04/2015   MCV 85.7 08/04/2015   PLT 289 08/04/2015    Lab Results  Component Value Date   CREATININE 1.08 08/04/2015    Lab Results  Component Value Date   TSH 0.83 12/25/2008       Component Value Date/Time   CHOL 138  12/25/2008 1007   HDL 36.10 (L) 12/25/2008 1007   CHOLHDL 4 12/25/2008 1007   VLDL 8.2 12/25/2008 1007   LDLCALC 94 12/25/2008 1007    Lab Results  Component Value Date   AST 26 08/04/2015   Lab Results  Component Value Date   ALT 21 08/04/2015     Urinalysis Unremarkable.  See EPIC.    Pertinent Imaging: CLINICAL DATA:  History of kidney stones 1 year ago. This is a follow-up study. No current symptoms.  EXAM: ABDOMEN - 1 VIEW  COMPARISON:  Abdominal and pelvic CT scan of August 04, 2015 and renal ultrasound of September 02, 2015  FINDINGS: No abnormal calcifications project over either kidney nor along the course of the ureters. No bladder stones are observed. There are pelvic phleboliths. The bowel gas pattern is unremarkable. The bony structures exhibit no acute abnormalities.  IMPRESSION: No calcified urinary tract stones are observed. No acute intra-abdominal abnormality is demonstrated.   Electronically Signed   By: David  Martinique M.D.   On: 09/06/2016 16:51   Assessment & Plan:    1. History of nephrolithiasis  - no stones seen on recent KUB  - Urinalysis, Complete  2. BPH with LUTS  - IPSS score is 1/1, it is worsening  - Continue conservative management, avoiding bladder irritants and timed voiding's  - most bothersome symptoms is/are nocturia  - RTC in 12 months for IPSS, PSA and exam   3. History of microscopic hematuria:   Patient's UA did not demonstrate hematuria on today's exam that is two consecutive UA without hematuria, no further screening warrented  Return in about 1 year (around 09/09/2017) for KUB, PSA, IPSS and exam.  These notes generated with voice recognition software. I apologize for typographical errors.  Zara Council, [facility] [address] [place], [place] [address] [phone]

## 2016-09-16 IMAGING — US US RENAL
1 series · 14 of 25 positions shown · non-contrast
Comparison: 08/04/2015

CLINICAL DATA: Left hydronephrosis

EXAM:
RENAL / URINARY TRACT ULTRASOUND COMPLETE

[Series 1: us renal · 0.22mm/px · 14 of 32 slices shown]
[im 1/32]
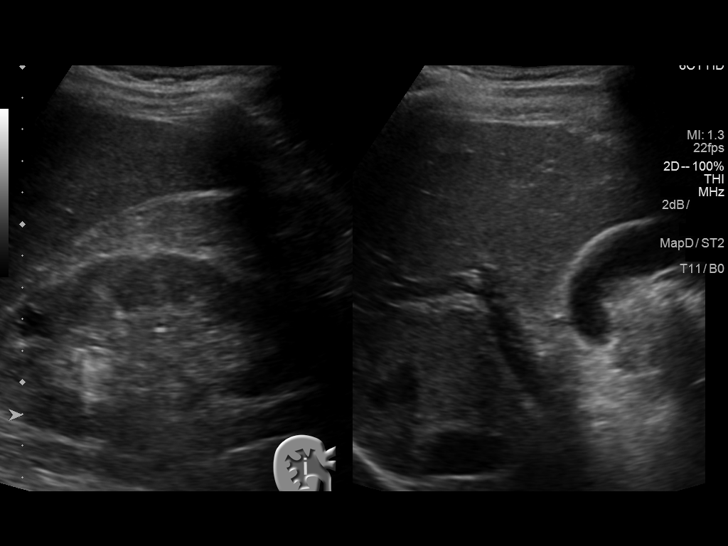
[im 3/32]
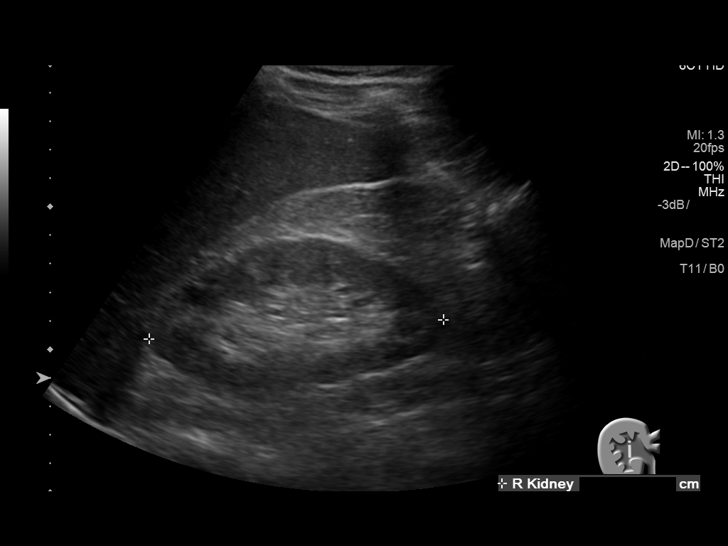
[im 6/32]
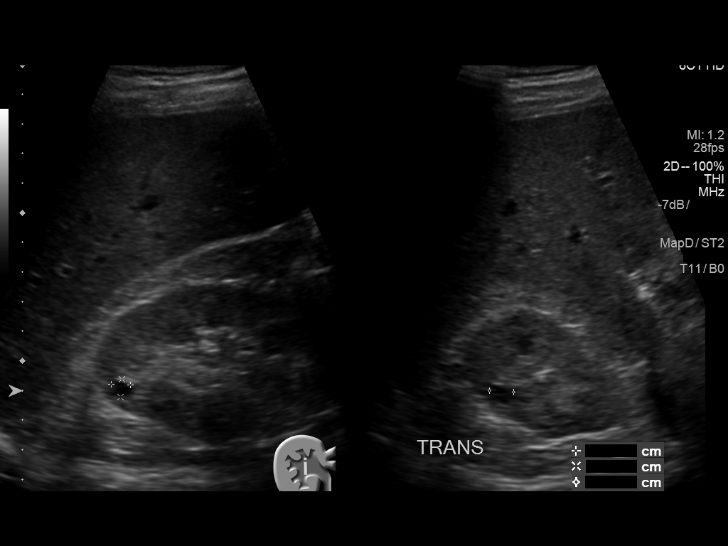
[im 8/32]
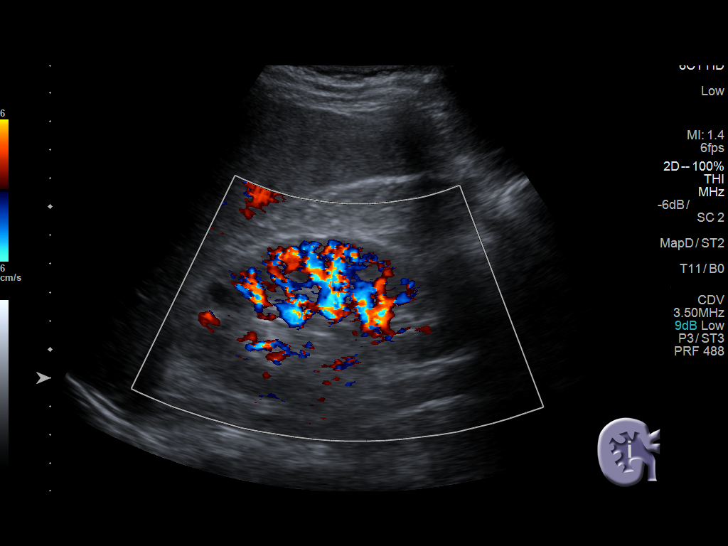
[im 11/32]
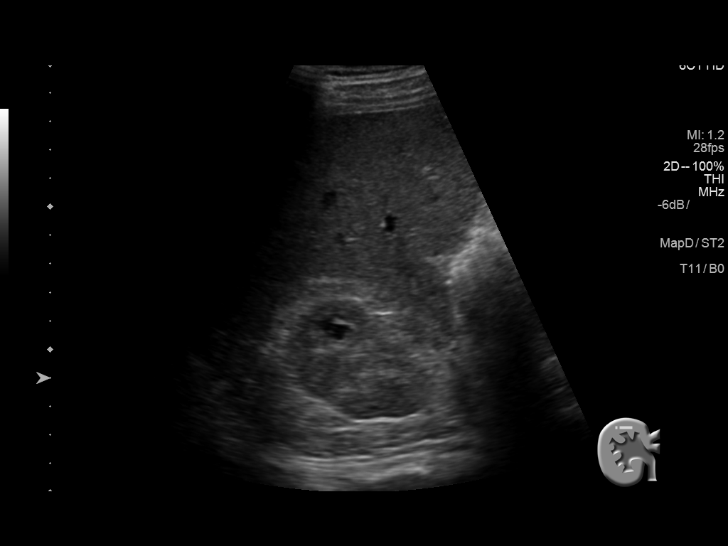
[im 12/32]
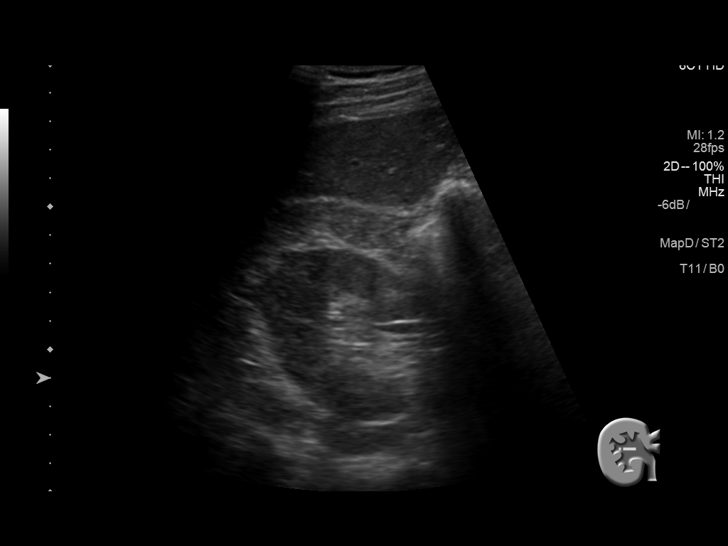
[im 15/32]
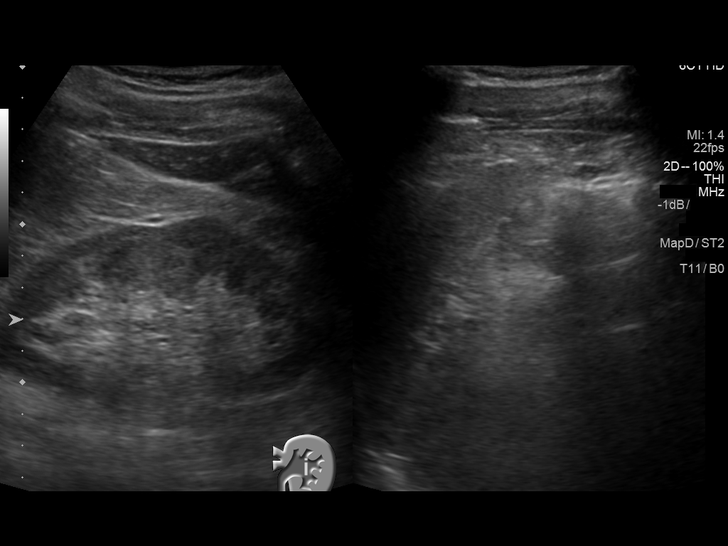
[im 17/32]
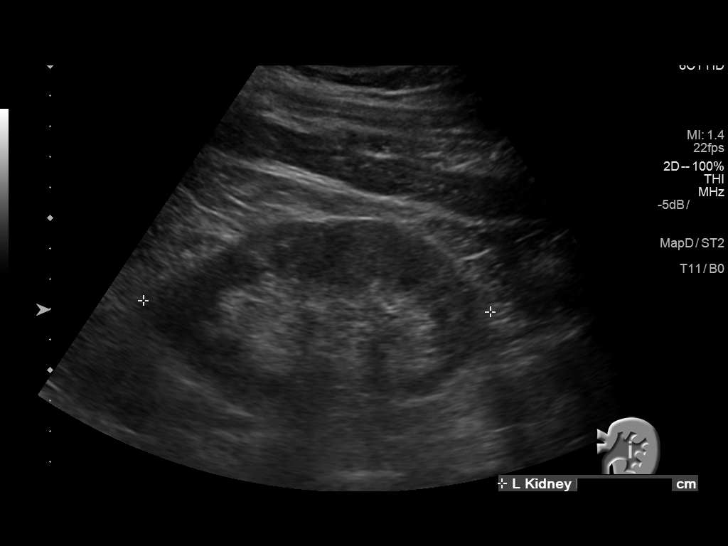
[im 20/32]
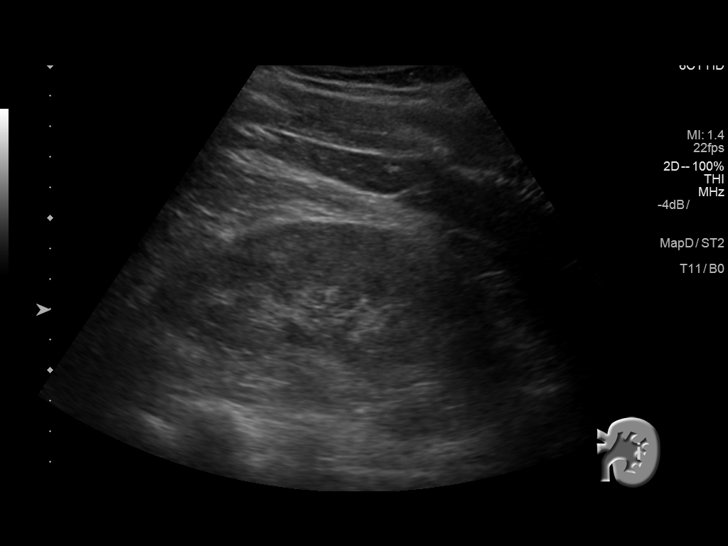
[im 21/32]
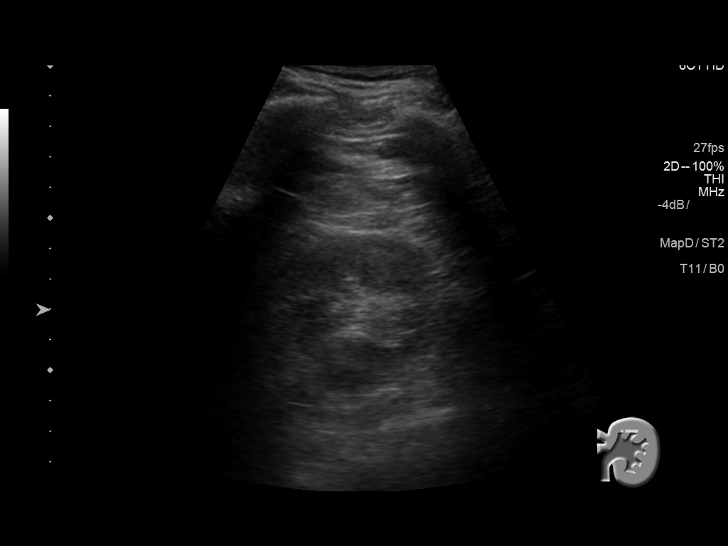
[im 24/32]
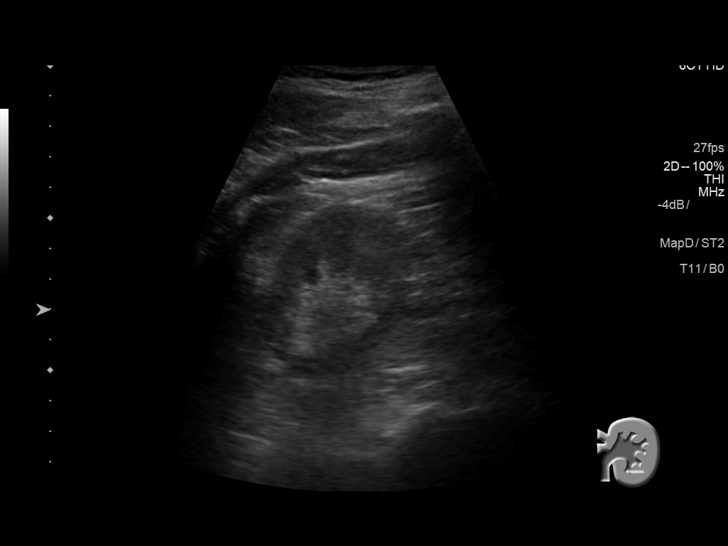
[im 26/32]
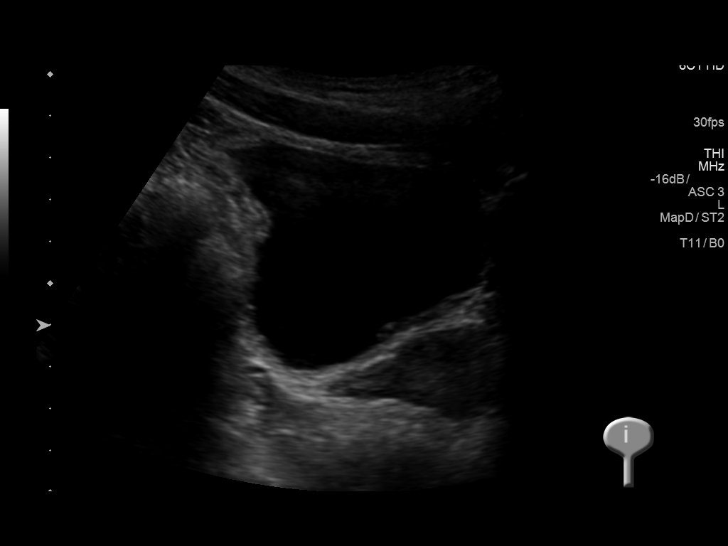
[im 29/32]
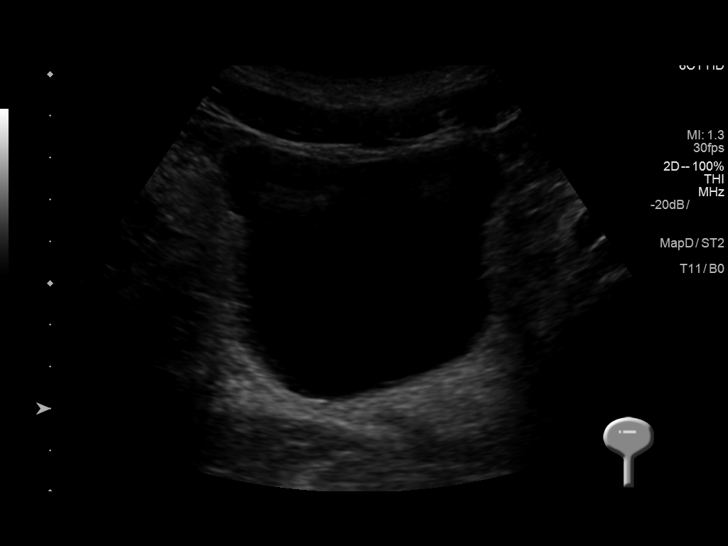
[im 32/32]
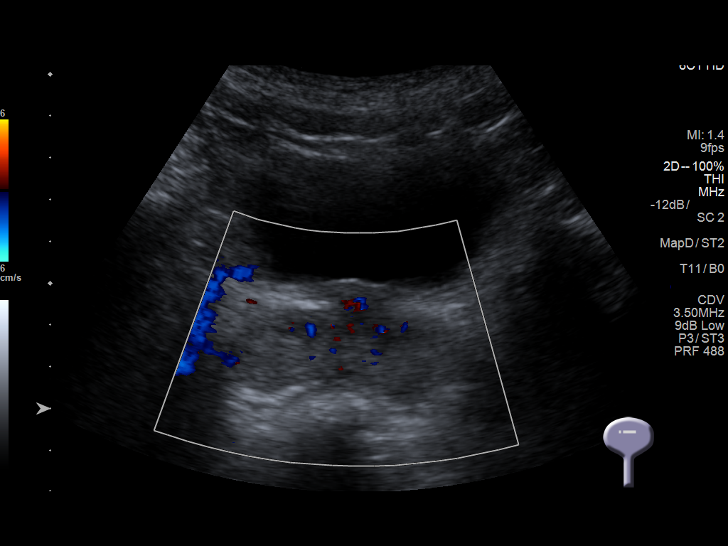

[14 of 25 positions shown; findings below may reference images not displayed]

FINDINGS: Right Kidney:

Length: 10.3 cm. Echogenicity within normal limits. No mass or
hydronephrosis visualized. Two simple appearing right kidney upper
pole cysts are present, 1 measuring 0.8 by 1.1 by 1.3 cm and the
other measuring 0.7 by 0.6 by 0.8 cm.

Left Kidney:

Length: 11.4 cm. Echogenicity within normal limits. No mass or
hydronephrosis visualized.

Bladder:

Appears normal for degree of bladder distention.
IMPRESSION: 1. No significant abnormality identified. There are 2 right kidney
upper pole cysts, but otherwise the sonographic appearance of the
kidneys and urinary bladder is unremarkable.

## 2017-03-02 ENCOUNTER — Encounter: Payer: Self-pay | Admitting: Family Medicine

## 2017-03-02 ENCOUNTER — Ambulatory Visit (INDEPENDENT_AMBULATORY_CARE_PROVIDER_SITE_OTHER)
Admission: RE | Admit: 2017-03-02 | Discharge: 2017-03-02 | Disposition: A | Discharge status: Home / Self Care | Payer: BLUE CROSS/BLUE SHIELD | Source: Ambulatory Visit | Attending: Family Medicine | Admitting: Family Medicine

## 2017-03-02 ENCOUNTER — Ambulatory Visit (INDEPENDENT_AMBULATORY_CARE_PROVIDER_SITE_OTHER): Payer: BLUE CROSS/BLUE SHIELD | Admitting: Family Medicine

## 2017-03-02 DIAGNOSIS — M7989 Other specified soft tissue disorders: Secondary | ICD-10-CM | POA: Diagnosis not present

## 2017-03-02 DIAGNOSIS — M545 Low back pain, unspecified: Secondary | ICD-10-CM

## 2017-03-02 DIAGNOSIS — J4540 Moderate persistent asthma, uncomplicated: Secondary | ICD-10-CM | POA: Insufficient documentation

## 2017-03-02 DIAGNOSIS — J301 Allergic rhinitis due to pollen: Secondary | ICD-10-CM | POA: Diagnosis not present

## 2017-03-02 DIAGNOSIS — K219 Gastro-esophageal reflux disease without esophagitis: Secondary | ICD-10-CM | POA: Diagnosis not present

## 2017-03-02 DIAGNOSIS — J4530 Mild persistent asthma, uncomplicated: Secondary | ICD-10-CM | POA: Diagnosis not present

## 2017-03-02 DIAGNOSIS — G8929 Other chronic pain: Secondary | ICD-10-CM | POA: Insufficient documentation

## 2017-03-02 DIAGNOSIS — M79671 Pain in right foot: Secondary | ICD-10-CM | POA: Diagnosis not present

## 2017-03-02 MED ORDER — MELOXICAM 7.5 MG PO TABS: 7.5000 mg | ORAL_TABLET | Freq: Two times a day (BID) | ORAL | 0 refills | Status: DC

## 2017-03-02 MED ORDER — ALBUTEROL SULFATE HFA 108 (90 BASE) MCG/ACT IN AERS: 1.0000 | INHALATION_SPRAY | Freq: Four times a day (QID) | RESPIRATORY_TRACT | 1 refills | Status: DC | PRN

## 2017-03-02 NOTE — Patient Instructions (Addendum)
Please stop at the lab to have labs drawn. We will call  the X-ray results for foot and low back. Continue heat and meloxicam for low back pain.  Start home physical therapy.

## 2017-03-02 NOTE — Assessment & Plan Note (Signed)
Well controlled on claritin.

## 2017-03-02 NOTE — Assessment & Plan Note (Signed)
Stable on omeprazole. 

## 2017-03-02 NOTE — Assessment & Plan Note (Signed)
Using albuterol prn. 

## 2017-03-02 NOTE — Progress Notes (Signed)
   Subjective:    Patient ID: Blake Molina, male    DOB: 1966-02-05, 51 y.o.   MRN: [id number]  HPI  51 year old male presents to establish care.  He was last seen at our office in 2012. He has not had a CPX in a while.  GERD: well controlled on omeprazole 40 mg daily.  He is currently using meloxicam for low back pain  Hx of herniated disc in low back 20 years ago. Usually treated with advil as needed. In last few weeks pulled something in back in crawl space.  Restarted meloxicam... Helped significantly with pain. Across low back and to left, Occ radiates to left leg.  No weakness, no numbness, no fever, no incontinence.   He plans on considering further eval with orthopedics. Needs MRI.   1 month ago had blister on right foot,  Had to walk funny on footwears boots regularly at work.. Blister healed but now pain and swelling in central dorsal foot. No redness , no heat. No fall or injury to foot. No family history of gout.     He uses albuterol inhalers for mild persistent asthma.. Triggered by allergies, mold, hay.  Using off and on.  Prevention:  Due for flu, Tdap Colon cancer screening due  prostate cancer screening performed by Dr. Anitra Lauth whom he sees for kidney stone.  Last PSA 08/2016 0.8  Social History /Family History/Past Medical History reviewed in detail and updated in EMR if needed. Blood pressure 112/70, pulse 65, temperature 98.2 F (36.8 C), temperature source Oral, height 5' 8.75" (1.746 m), weight 157 lb 4 oz (71.3 kg).  Review of Systems  Constitutional: Negative for fatigue and fever.  HENT: Negative for ear pain.   Eyes: Negative for pain.  Respiratory: Negative for cough and shortness of breath.   Cardiovascular: Negative for chest pain, palpitations and leg swelling.  Gastrointestinal: Negative for abdominal pain.  Genitourinary: Negative for dysuria.  Musculoskeletal: Negative for arthralgias.  Neurological: Negative for syncope,  light-headedness and headaches.  Psychiatric/Behavioral: Negative for dysphoric mood.       Objective:   Physical Exam  Constitutional: Vital signs are normal. He appears well-developed and well-nourished.  HENT:  Head: Normocephalic.  Right Ear: Hearing normal.  Left Ear: Hearing normal.  Nose: Nose normal.  Mouth/Throat: Oropharynx is clear and moist and mucous membranes are normal.  Neck: Trachea normal. Carotid bruit is not present. No thyroid mass and no thyromegaly present.  Cardiovascular: Normal rate, regular rhythm and normal pulses.  Exam reveals no gallop, no distant heart sounds and no friction rub.   No murmur heard. No peripheral edema  Pulmonary/Chest: Effort normal and breath sounds normal. No respiratory distress.  Musculoskeletal:       Lumbar back: He exhibits decreased range of motion and tenderness. He exhibits no bony tenderness and no swelling.  Swelling and erythema centrally over left dorsal foot, at 2,3,43mtps Tender over same area.  Skin: Skin is warm, dry and intact. No rash noted.  Psychiatric: He has a normal mood and affect. His speech is normal and behavior is normal. Thought content normal.          Assessment & Plan:

## 2017-03-03 LAB — URIC ACID: Uric Acid, Serum: 4.8 mg/dL (ref 4.0–7.8)

## 2017-03-13 NOTE — Assessment & Plan Note (Signed)
Begin eval with X-ray. Treat for now with NSAIDs, ice and elevation.

## 2017-03-13 NOTE — Assessment & Plan Note (Signed)
Continue heat and meloxicam for low back pain.  Start home physical therapy.

## 2017-03-17 ENCOUNTER — Telehealth: Payer: Self-pay | Admitting: Family Medicine

## 2017-03-17 DIAGNOSIS — M545 Low back pain: Principal | ICD-10-CM

## 2017-03-17 DIAGNOSIS — G8929 Other chronic pain: Secondary | ICD-10-CM

## 2017-03-17 NOTE — Telephone Encounter (Signed)
Caller Name:Ival Greek Relationship to Patient:self Best [phone] Pharmacy:  Reason for call:  Pt would like order for mri. He would like to go to [facility] or [facility]

## 2017-03-18 ENCOUNTER — Telehealth: Payer: Self-pay

## 2017-03-18 DIAGNOSIS — Z77018 Contact with and (suspected) exposure to other hazardous metals: Secondary | ICD-10-CM

## 2017-03-18 NOTE — Telephone Encounter (Signed)
Ordered.. Was unsure without without contrast.. Okay to change if needed.

## 2017-03-18 NOTE — Addendum Note (Signed)
Addended by: Carter Kitten on: 03/18/2017 10:15 AM   Modules accepted: Orders

## 2017-03-18 NOTE — Telephone Encounter (Signed)
Blake Molina MRI with [facility] left v/m; pt has appt MRI lumbar next Thur and pt needs order for orbits of eyes for MRI clearance because pt has had exposure of working with metal. Request order faxed to [phone].

## 2017-03-21 NOTE — Addendum Note (Signed)
Addended by: Carter Kitten on: 03/21/2017 09:20 AM   Modules accepted: Orders

## 2017-03-24 ENCOUNTER — Ambulatory Visit: Payer: BLUE CROSS/BLUE SHIELD

## 2017-03-24 ENCOUNTER — Ambulatory Visit
Admission: RE | Admit: 2017-03-24 | Discharge: 2017-03-24 | Disposition: A | Discharge status: Home / Self Care | Payer: BLUE CROSS/BLUE SHIELD | Source: Ambulatory Visit | Attending: Family Medicine | Admitting: Family Medicine

## 2017-03-24 DIAGNOSIS — G8929 Other chronic pain: Secondary | ICD-10-CM

## 2017-03-24 DIAGNOSIS — M545 Low back pain: Principal | ICD-10-CM

## 2017-03-30 ENCOUNTER — Other Ambulatory Visit: Payer: Self-pay | Admitting: Family Medicine

## 2017-03-30 NOTE — Telephone Encounter (Signed)
Last office visit 03/02/2017.  Last refilled 03/02/2017 for #[id number] with no refills.  Ok to refill?

## 2017-03-31 ENCOUNTER — Ambulatory Visit
Admission: RE | Admit: 2017-03-31 | Discharge: 2017-03-31 | Disposition: A | Discharge status: Home / Self Care | Payer: BLUE CROSS/BLUE SHIELD | Source: Ambulatory Visit | Attending: Family Medicine | Admitting: Family Medicine

## 2017-03-31 DIAGNOSIS — Z0100 Encounter for examination of eyes and vision without abnormal findings: Secondary | ICD-10-CM | POA: Diagnosis not present

## 2017-03-31 DIAGNOSIS — M48061 Spinal stenosis, lumbar region without neurogenic claudication: Secondary | ICD-10-CM | POA: Insufficient documentation

## 2017-03-31 DIAGNOSIS — M2578 Osteophyte, vertebrae: Secondary | ICD-10-CM | POA: Diagnosis not present

## 2017-03-31 DIAGNOSIS — M545 Low back pain: Secondary | ICD-10-CM | POA: Diagnosis not present

## 2017-03-31 DIAGNOSIS — G8929 Other chronic pain: Secondary | ICD-10-CM | POA: Diagnosis not present

## 2017-03-31 DIAGNOSIS — Z135 Encounter for screening for eye and ear disorders: Secondary | ICD-10-CM | POA: Diagnosis not present

## 2017-03-31 DIAGNOSIS — Z77018 Contact with and (suspected) exposure to other hazardous metals: Secondary | ICD-10-CM

## 2017-03-31 MED ORDER — GADOBENATE DIMEGLUMINE 529 MG/ML IV SOLN
15.0000 mL | Freq: Once | INTRAVENOUS | Status: AC | PRN
  Administered 2017-03-31: 14 mL via INTRAVENOUS

## 2017-04-21 ENCOUNTER — Telehealth: Payer: Self-pay | Admitting: *Deleted

## 2017-04-21 NOTE — Telephone Encounter (Signed)
Copied from [place]. Topic: Inquiry >> Apr 21, 2017  9:21 AM Cecelia Byars, NT wrote: Reason for CRM:  Want the radiology results sent Dr Arnoldo Morale at [facility] neuro, please fax [phone]  MRI of Lumbar Spine and Lumbar Spine X-ray faxed to Dr. Arnoldo Morale at [phone] as requested.

## 2017-04-25 ENCOUNTER — Other Ambulatory Visit: Payer: Self-pay | Admitting: *Deleted

## 2017-04-25 NOTE — Telephone Encounter (Signed)
Last office visit 03/02/2017.  Last refilled 03/31/2017 for #60 with no refills.  Ok to refill?

## 2017-04-26 MED ORDER — MELOXICAM 7.5 MG PO TABS: 7.5000 mg | ORAL_TABLET | Freq: Two times a day (BID) | ORAL | 0 refills | Status: DC

## 2017-05-10 DIAGNOSIS — M545 Low back pain: Secondary | ICD-10-CM | POA: Diagnosis not present

## 2017-05-10 DIAGNOSIS — M5137 Other intervertebral disc degeneration, lumbosacral region: Secondary | ICD-10-CM | POA: Diagnosis not present

## 2017-05-10 DIAGNOSIS — R030 Elevated blood-pressure reading, without diagnosis of hypertension: Secondary | ICD-10-CM | POA: Diagnosis not present

## 2017-05-10 DIAGNOSIS — M9983 Other biomechanical lesions of lumbar region: Secondary | ICD-10-CM | POA: Diagnosis not present

## 2017-05-27 ENCOUNTER — Other Ambulatory Visit: Payer: Self-pay | Admitting: Family Medicine

## 2017-05-27 NOTE — Telephone Encounter (Signed)
Ok to refill? Electronically refill request for meloxicam (MOBIC) 7.5 MG tablet  Last prescribed on 04/16/2017. Last seen on 03/02/2017

## 2017-05-30 ENCOUNTER — Telehealth: Payer: Self-pay | Admitting: Family Medicine

## 2017-05-30 DIAGNOSIS — Z125 Encounter for screening for malignant neoplasm of prostate: Secondary | ICD-10-CM

## 2017-05-30 DIAGNOSIS — Z13220 Encounter for screening for lipoid disorders: Secondary | ICD-10-CM

## 2017-05-30 NOTE — Telephone Encounter (Signed)
-----   Message from Ellamae Sia sent at 05/26/2017  2:54 PM EST ----- Regarding: Lab orders for Tuesday, 12.18.18 Patient is scheduled for CPX labs, please order future labs, Thanks , Karna Christmas

## 2017-05-31 ENCOUNTER — Other Ambulatory Visit: Payer: BLUE CROSS/BLUE SHIELD

## 2017-06-03 ENCOUNTER — Encounter: Payer: BLUE CROSS/BLUE SHIELD | Admitting: Family Medicine

## 2017-06-27 ENCOUNTER — Other Ambulatory Visit: Payer: Self-pay | Admitting: Family Medicine

## 2017-06-27 NOTE — Telephone Encounter (Signed)
Last office visit 03/02/2017.  Last refilled 05/27/2017 for #60 with no refills.  Ok to refill?

## 2017-07-05 ENCOUNTER — Other Ambulatory Visit: Payer: BLUE CROSS/BLUE SHIELD

## 2017-07-07 ENCOUNTER — Encounter: Payer: BLUE CROSS/BLUE SHIELD | Admitting: Family Medicine

## 2017-07-15 ENCOUNTER — Encounter: Payer: Self-pay | Admitting: Family Medicine

## 2017-08-04 ENCOUNTER — Ambulatory Visit (INDEPENDENT_AMBULATORY_CARE_PROVIDER_SITE_OTHER): Payer: BLUE CROSS/BLUE SHIELD | Admitting: Family Medicine

## 2017-08-04 ENCOUNTER — Encounter: Payer: Self-pay | Admitting: Family Medicine

## 2017-08-04 ENCOUNTER — Other Ambulatory Visit: Payer: Self-pay

## 2017-08-04 VITALS — BP 120/80 | HR 71 | Temp 98.7°F | Ht 69.0 in | Wt 165.2 lb

## 2017-08-04 DIAGNOSIS — Z125 Encounter for screening for malignant neoplasm of prostate: Secondary | ICD-10-CM | POA: Diagnosis not present

## 2017-08-04 DIAGNOSIS — M5440 Lumbago with sciatica, unspecified side: Secondary | ICD-10-CM

## 2017-08-04 DIAGNOSIS — Z23 Encounter for immunization: Secondary | ICD-10-CM | POA: Diagnosis not present

## 2017-08-04 DIAGNOSIS — G8929 Other chronic pain: Secondary | ICD-10-CM | POA: Diagnosis not present

## 2017-08-04 DIAGNOSIS — Z1211 Encounter for screening for malignant neoplasm of colon: Secondary | ICD-10-CM | POA: Diagnosis not present

## 2017-08-04 DIAGNOSIS — Z13220 Encounter for screening for lipoid disorders: Secondary | ICD-10-CM

## 2017-08-04 DIAGNOSIS — J4540 Moderate persistent asthma, uncomplicated: Secondary | ICD-10-CM | POA: Diagnosis not present

## 2017-08-04 DIAGNOSIS — Z0000 Encounter for general adult medical examination without abnormal findings: Secondary | ICD-10-CM | POA: Diagnosis not present

## 2017-08-04 MED ORDER — BUDESONIDE-FORMOTEROL FUMARATE 80-4.5 MCG/ACT IN AERO: 2.0000 | INHALATION_SPRAY | Freq: Two times a day (BID) | RESPIRATORY_TRACT | 3 refills | Status: DC

## 2017-08-04 NOTE — Addendum Note (Signed)
Addended by: Lendon Collar on: 08/04/2017 04:25 PM   Modules accepted: Orders

## 2017-08-04 NOTE — Assessment & Plan Note (Signed)
Improved control with meloxicam.

## 2017-08-04 NOTE — Patient Instructions (Addendum)
Please stop at the lab to have labs drawn.   Also pick up stool test in labs as well.

## 2017-08-04 NOTE — Progress Notes (Signed)
   Subjective:    Patient ID: Blake Molina, male    DOB: 08-May-1966, 52 y.o.   MRN: [id number]  HPI The patient is here for annual wellness exam and preventative care.     Due for repeat labs.  ASTHMA, moderate persistent.. Had worsened so he restarted the Symbicort in last several months Symptoms are well controlled: yes Using medications without problems: Used albuterol Night time symptoms: not now Wheeze/SOB: none ER visits since last visit: none Missed work or school: none   Chronic low back pain.. Improved with meloxicam, able to decrease to lower dose once daily. Followed  by Dr. Aggie Hacker.   Social History /Family History/Past Medical History reviewed in detail and updated in EMR if needed. Blood pressure 120/80, pulse 71, temperature 98.7 F (37.1 C), temperature source Oral, height 5\' 9"  (1.753 m), weight 165 lb 4 oz (75 kg).  Review of Systems  Constitutional: Negative for fatigue and fever.  HENT: Negative for ear pain.   Eyes: Negative for pain.  Respiratory: Negative for cough and shortness of breath.   Cardiovascular: Negative for chest pain, palpitations and leg swelling.  Gastrointestinal: Negative for abdominal pain.  Genitourinary: Negative for dysuria.  Musculoskeletal: Negative for arthralgias.  Neurological: Negative for syncope, light-headedness and headaches.  Psychiatric/Behavioral: Negative for dysphoric mood.       Objective:   Physical Exam  Constitutional: He appears well-developed and well-nourished.  Non-toxic appearance. He does not appear ill. No distress.  HENT:  Head: Normocephalic and atraumatic.  Right Ear: Hearing, tympanic membrane, external ear and ear canal normal.  Left Ear: Hearing, tympanic membrane, external ear and ear canal normal.  Nose: Nose normal.  Mouth/Throat: Uvula is midline, oropharynx is clear and moist and mucous membranes are normal.  Eyes: Conjunctivae, EOM and lids are normal. Pupils are equal, round, and  reactive to light. Lids are everted and swept, no foreign bodies found.  Neck: Trachea normal, normal range of motion and phonation normal. Neck supple. Carotid bruit is not present. No thyroid mass and no thyromegaly present.  Cardiovascular: Normal rate, regular rhythm, S1 normal, S2 normal, intact distal pulses and normal pulses. Exam reveals no gallop.  No murmur heard. Pulmonary/Chest: Breath sounds normal. He has no wheezes. He has no rhonchi. He has no rales.  Abdominal: Soft. Normal appearance and bowel sounds are normal. There is no hepatosplenomegaly. There is no tenderness. There is no rebound, no guarding and no CVA tenderness. No hernia.  Lymphadenopathy:    He has no cervical adenopathy.  Neurological: He is alert. He has normal strength and normal reflexes. No cranial nerve deficit or sensory deficit. Gait normal.  Skin: Skin is warm, dry and intact. No rash noted.  Psychiatric: He has a normal mood and affect. His speech is normal and behavior is normal. Judgment normal.          Assessment & Plan:  The patient's preventative maintenance and recommended screening tests for an annual wellness exam were reviewed in full today. Brought up to date unless services declined.  Counselled on the importance of diet, exercise, and its role in overall health and mortality. The patient's FH and SH was reviewed, including their home life, tobacco status, and drug and alcohol status.    Vaccines: updated Td. Prostate Cancer Screen:  See urologist follows PSA. Colon Cancer Screen:       Smoking Status:nonsmoker ETOH/ drug use: 1 on weekends/ none  HIV screen:

## 2017-08-04 NOTE — Assessment & Plan Note (Signed)
Well controlled on symbicort. Using albuterol prn.

## 2017-08-05 ENCOUNTER — Encounter: Payer: Self-pay | Admitting: *Deleted

## 2017-08-05 LAB — COMPREHENSIVE METABOLIC PANEL
ALK PHOS: 46 U/L (ref 39–117)
ALT: 13 U/L (ref 0–53)
AST: 19 U/L (ref 0–37)
Albumin: 4.3 g/dL (ref 3.5–5.2)
BILIRUBIN TOTAL: 0.7 mg/dL (ref 0.2–1.2)
BUN: 16 mg/dL (ref 6–23)
CO2: 27 meq/L (ref 19–32)
Calcium: 9.7 mg/dL (ref 8.4–10.5)
Chloride: 104 mEq/L (ref 96–112)
Creatinine, Ser: 0.92 mg/dL (ref 0.40–1.50)
GFR: 92.1 mL/min (ref >60.00)
GLUCOSE: 77 mg/dL (ref 70–99)
Potassium: 4.4 mEq/L (ref 3.5–5.1)
SODIUM: 139 meq/L (ref 135–145)
TOTAL PROTEIN: 6.6 g/dL (ref 6.0–8.3)

## 2017-08-05 LAB — LIPID PANEL
CHOL/HDL RATIO: 3
Cholesterol: 167 mg/dL (ref 0–200)
HDL: 50.7 mg/dL (ref >39.00)
LDL Cholesterol: 109 mg/dL — ABNORMAL HIGH (ref 0–99)
NONHDL: 116.07
Triglycerides: 33 mg/dL (ref 0.0–149.0)
VLDL: 6.6 mg/dL (ref 0.0–40.0)

## 2017-08-05 LAB — PSA: PSA: 0.95 ng/mL (ref 0.10–4.00)

## 2017-09-06 ENCOUNTER — Other Ambulatory Visit: Payer: BLUE CROSS/BLUE SHIELD

## 2017-09-08 ENCOUNTER — Ambulatory Visit: Payer: BLUE CROSS/BLUE SHIELD | Admitting: Urology

## 2017-09-28 ENCOUNTER — Other Ambulatory Visit: Payer: Self-pay | Admitting: Neurosurgery

## 2017-09-28 DIAGNOSIS — M533 Sacrococcygeal disorders, not elsewhere classified: Secondary | ICD-10-CM

## 2017-10-16 ENCOUNTER — Other Ambulatory Visit: Payer: Self-pay | Admitting: Family Medicine

## 2017-10-17 NOTE — Telephone Encounter (Signed)
Last office visit 08/04/2017.  Last refilled 06/27/2017 for #60 with no refills.  Ok to refill?

## 2017-10-25 ENCOUNTER — Other Ambulatory Visit: Payer: Self-pay | Admitting: Family Medicine

## 2017-11-01 ENCOUNTER — Other Ambulatory Visit: Payer: Self-pay | Admitting: Family Medicine

## 2017-11-01 DIAGNOSIS — N401 Enlarged prostate with lower urinary tract symptoms: Secondary | ICD-10-CM

## 2017-11-01 DIAGNOSIS — N138 Other obstructive and reflux uropathy: Secondary | ICD-10-CM

## 2017-11-02 ENCOUNTER — Other Ambulatory Visit: Payer: BLUE CROSS/BLUE SHIELD

## 2017-11-02 DIAGNOSIS — N401 Enlarged prostate with lower urinary tract symptoms: Secondary | ICD-10-CM | POA: Diagnosis not present

## 2017-11-02 DIAGNOSIS — Z125 Encounter for screening for malignant neoplasm of prostate: Secondary | ICD-10-CM | POA: Diagnosis not present

## 2017-11-02 DIAGNOSIS — N138 Other obstructive and reflux uropathy: Secondary | ICD-10-CM | POA: Diagnosis not present

## 2017-11-03 LAB — PSA: PROSTATE SPECIFIC AG, SERUM: 0.8 ng/mL (ref 0.0–4.0)

## 2017-11-08 ENCOUNTER — Ambulatory Visit
Admission: RE | Admit: 2017-11-08 | Discharge: 2017-11-08 | Disposition: A | Discharge status: Home / Self Care | Payer: BLUE CROSS/BLUE SHIELD | Source: Ambulatory Visit | Attending: Urology | Admitting: Urology

## 2017-11-08 ENCOUNTER — Other Ambulatory Visit: Payer: Self-pay

## 2017-11-08 DIAGNOSIS — Z87442 Personal history of urinary calculi: Secondary | ICD-10-CM | POA: Diagnosis not present

## 2017-11-08 NOTE — Progress Notes (Signed)
.    8:37 AM   MURRELL DOME 16-Sep-1965 [id number]  Referring provider: Jinny Sanders, MD [address] [place], [place] [address]  Chief Complaint  Patient presents with  . Nephrolithiasis    HPI: Patient is a 52 year old Caucasian male who presents today for a one year follow up for a history of nephrolithiasis and BPH with LUTS.    History of nephrolithiasis Patient had spontaneously passed a left ureteral stone.  Renal ultrasound performed on 09/02/2015 noted no hydronephrosis.  Stone composition was calcium oxalate monohydrate composition of 98% and a calcium phosphate carbonate of 2%.  KUB taken on 11/08/2017 noted pelvic calcifications noted most consistent phleboliths. No change from 09/06/2016 no other significant abnormality identified.  He does not endorse any episodes of gross hematuria.  He has not had any further flank pain.  He states his paternal grandfather had stones.    BPH WITH LUTS His I PSS is 0/0.  His previous IPSS score was 1/0.   His major complaint today is still nocturia x 1, which is rare.  He has had these symptoms for several years.  He denies any dysuria, hematuria or suprapubic pain.   He also denies any recent fevers, chills, nausea or vomiting.   He does not have a family history of PCa.  His most recent PSA was 0.8.   IPSS    Row Name 11/09/17 0800         International Prostate Symptom Score   How often have you had the sensation of not emptying your bladder?  Not at All     How often have you had to urinate less than every two hours?  Not at All     How often have you found you stopped and started again several times when you urinated?  Not at All     How often have you found it difficult to postpone urination?  Not at All     How often have you had a weak urinary stream?  Not at All     How often have you had to strain to start urination?  Not at All     How many times did you typically get up at night to urinate?  None     Total  IPSS Score  0       Quality of Life due to urinary symptoms   If you were to spend the rest of your life with your urinary condition just the way it is now how would you feel about that?  Delighted      PMH: Past Medical History:  Diagnosis Date  . Allergic rhinitis, cause unspecified   . BPH without obstruction/lower urinary tract symptoms   . Congenital hypertrophic pyloric stenosis   . Esophageal reflux   . Heart burn   . History of kidney stones   . Other chest pain   . Panic disorder without agoraphobia   . Unspecified asthma(493.90)     Surgical History: Past Surgical History:  Procedure Laterality Date  . herniated disc-back surgery  1996  . Stenosis surgery (other)  1960s   hx of pyloric stenosis as infant    Home Medications:  Allergies as of 11/09/2017   No Known Allergies     Medication List        Accurate as of 11/09/17  8:37 AM. Always use your most recent med list.          ADVIL 200 MG Caps  Generic drug:  Ibuprofen Take by mouth as needed.   albuterol 108 (90 Base) MCG/ACT inhaler Commonly known as:  VENTOLIN HFA Inhale 1-2 puffs into the lungs every 6 (six) hours as needed for wheezing or shortness of breath.   meloxicam 7.5 MG tablet Commonly known as:  MOBIC TAKE 1 TABLET (7.5 MG TOTAL) 2 (TWO) TIMES DAILY BY MOUTH.   omeprazole 40 MG capsule Commonly known as:  PRILOSEC Take 40 mg by mouth daily.   SYMBICORT 80-4.5 MCG/ACT inhaler Generic drug:  budesonide-formoterol TAKE 2 PUFFS BY MOUTH TWICE A DAY   TYLENOL 8 HOUR PO Take by mouth.       Allergies: No Known Allergies  Family History: Family History  Problem Relation Age of Onset  . Coronary artery disease Father        stent age 20  . Hypertension Father   . Allergies Mother   . Asthma Brother   . Allergies Brother   . Hypertension Paternal Grandfather   . Kidney disease Neg Hx   . Prostate cancer Neg Hx   . Kidney cancer Neg Hx   . Bladder Cancer Neg Hx      Social History:  reports that he has never smoked. He has never used smokeless tobacco. He reports that he drinks alcohol. He reports that he does not use drugs.  ROS: UROLOGY Frequent Urination?: No Hard to postpone urination?: No Burning/pain with urination?: No Get up at night to urinate?: Yes Leakage of urine?: No Urine stream starts and stops?: No Trouble starting stream?: No Do you have to strain to urinate?: No Blood in urine?: No Urinary tract infection?: No Sexually transmitted disease?: No Injury to kidneys or bladder?: No Painful intercourse?: No Weak stream?: No Erection problems?: No Penile pain?: No  Gastrointestinal Nausea?: No Vomiting?: No Indigestion/heartburn?: Yes Diarrhea?: No Constipation?: No  Constitutional Fever: No Night sweats?: No Weight loss?: No Fatigue?: No  Skin Skin rash/lesions?: No Itching?: No  Eyes Blurred vision?: No Double vision?: No  Ears/Nose/Throat Sore throat?: No Sinus problems?: No  Hematologic/Lymphatic Swollen glands?: No Easy bruising?: No  Cardiovascular Leg swelling?: No Chest pain?: No  Respiratory Cough?: No Shortness of breath?: No  Endocrine Excessive thirst?: No  Musculoskeletal Back pain?: Yes Joint pain?: No  Neurological Headaches?: No Dizziness?: No  Psychologic Depression?: No Anxiety?: No  Physical Exam: BP 132/77   Pulse (!) 55   Ht 5\' 9"  (1.753 m)   Wt 163 lb 12.8 oz (74.3 kg)   BMI 24.19 kg/m   Constitutional: Well nourished. Alert and oriented, No acute distress. HEENT: [place] AT, moist mucus membranes. Trachea midline, no masses. Cardiovascular: No clubbing, cyanosis, or edema. Respiratory: Normal respiratory effort, no increased work of breathing. GI: Abdomen is soft, non tender, non distended, no abdominal masses. Liver and spleen not palpable.  No hernias appreciated.  Stool sample for occult testing is not indicated.   GU: No CVA tenderness.  No bladder  fullness or masses.  Patient with circumcised phallus.  Urethral meatus is patent.  No penile discharge. No penile lesions or rashes. Scrotum without lesions, cysts, rashes and/or edema.  Testicles are located scrotally bilaterally. No masses are appreciated in the testicles. Left and right epididymis are normal. Rectal: Patient with  normal sphincter tone. Anus and perineum without scarring or rashes. No rectal masses are appreciated. Prostate is approximately 45 grams, no nodules are appreciated. Seminal vesicles are normal. Skin: No rashes, bruises or suspicious lesions. Lymph: No cervical or inguinal  adenopathy. Neurologic: Grossly intact, no focal deficits, moving all 4 extremities. Psychiatric: Normal mood and affect.   Laboratory Data: PSA History  0.8 ng/mL on 09/11/2015  0.9 ng/mL on 09/06/2016  0.8 ng/mL on 11/02/2017   Lab Results  Component Value Date   WBC 12.7 (H) 08/04/2015   HGB 14.5 08/04/2015   HCT 43.4 08/04/2015   MCV 85.7 08/04/2015   PLT 289 08/04/2015    Lab Results  Component Value Date   CREATININE 0.92 08/04/2017    Lab Results  Component Value Date   TSH 0.83 12/25/2008       Component Value Date/Time   CHOL 167 08/04/2017 1625   HDL 50.70 08/04/2017 1625   CHOLHDL 3 08/04/2017 1625   VLDL 6.6 08/04/2017 1625   LDLCALC 109 (H) 08/04/2017 1625    Lab Results  Component Value Date   AST 19 08/04/2017   Lab Results  Component Value Date   ALT 13 08/04/2017   I have reviewed the labs  Pertinent Imaging: CLINICAL DATA:  History of kidney stone.  EXAM: ABDOMEN - 1 VIEW  COMPARISON:  03/02/2017.  09/06/2016.  FINDINGS: Pelvic calcifications noted most consistent phleboliths. No bowel distention or free air. Stool noted throughout the colon. Mild lumbar scoliosis concave left. Degenerative changes lumbar spine and both hips.  IMPRESSION: Pelvic calcifications noted most consistent phleboliths. No change from 09/06/2016 no  other significant abnormality identified.   Electronically Signed   By: Marcello Moores  Register   On: 11/08/2017 10:26  I have independently reviewed the films.    Assessment & Plan:    1. History of nephrolithiasis  - no stones seen on recent KUB  - RTC in one year for KUB  2. BPH with LU TS  - I PSS score is 0/0, it is improved  - Continue conservative management, avoiding bladder irritants and timed voiding's  - most bothersome symptoms is/are nocturia  - RTC in 12 months for IPSS, PSA and exam   Return in about 1 year (around 11/10/2018) for IPSS, PSA and exam.  These notes generated with voice recognition software. I apologize for typographical errors.  Zara Council, PA-C  [facility] [address] [place], [place] [address] [phone]

## 2017-11-09 ENCOUNTER — Ambulatory Visit: Payer: BLUE CROSS/BLUE SHIELD | Admitting: Urology

## 2017-11-09 ENCOUNTER — Encounter: Payer: Self-pay | Admitting: Urology

## 2017-11-09 ENCOUNTER — Other Ambulatory Visit: Payer: Self-pay | Admitting: Family Medicine

## 2017-11-09 VITALS — BP 132/77 | HR 55 | Ht 69.0 in | Wt 163.8 lb

## 2017-11-09 DIAGNOSIS — Z87442 Personal history of urinary calculi: Secondary | ICD-10-CM

## 2017-11-09 DIAGNOSIS — N138 Other obstructive and reflux uropathy: Secondary | ICD-10-CM

## 2017-11-09 DIAGNOSIS — N401 Enlarged prostate with lower urinary tract symptoms: Secondary | ICD-10-CM

## 2017-11-10 NOTE — Telephone Encounter (Signed)
Last office visit 08/04/2017.  Last refilled 10/18/2017 for #60.  Pharmacy is asking for a 90 day supply.  Ok to refill?

## 2017-11-11 ENCOUNTER — Ambulatory Visit: Admission: RE | Admit: 2017-11-11 | Payer: BLUE CROSS/BLUE SHIELD | Source: Ambulatory Visit

## 2017-11-24 ENCOUNTER — Ambulatory Visit: Admission: RE | Admit: 2017-11-24 | Payer: BLUE CROSS/BLUE SHIELD | Source: Ambulatory Visit

## 2018-02-14 ENCOUNTER — Other Ambulatory Visit: Payer: Self-pay | Admitting: Family Medicine

## 2018-02-14 NOTE — Telephone Encounter (Signed)
Last office visit 08/04/2017 for CPE. No future appointments.  Last refilled 11/10/2017 for #180 with no refills.  Ok to refill?

## 2018-02-21 ENCOUNTER — Other Ambulatory Visit: Payer: Self-pay

## 2018-02-21 ENCOUNTER — Ambulatory Visit
Admission: EM | Admit: 2018-02-21 | Discharge: 2018-02-21 | Disposition: A | Discharge status: Home / Self Care | Payer: BLUE CROSS/BLUE SHIELD | Attending: Family Medicine | Admitting: Family Medicine

## 2018-02-21 ENCOUNTER — Encounter: Payer: Self-pay | Admitting: Emergency Medicine

## 2018-02-21 DIAGNOSIS — S29011A Strain of muscle and tendon of front wall of thorax, initial encounter: Secondary | ICD-10-CM | POA: Diagnosis not present

## 2018-02-21 MED ORDER — METAXALONE 800 MG PO TABS: 800.0000 mg | ORAL_TABLET | Freq: Three times a day (TID) | ORAL | 0 refills | Status: DC | PRN

## 2018-02-21 NOTE — ED Provider Notes (Signed)
[facility]    CSN: [id number] Arrival date & time: 02/21/18  1341  History   Chief Complaint Chief Complaint  Patient presents with  . rib pain  . Hand Pain    right pinky   HPI  52 year old male presents with the above complaints.  Patient reports he developed left-sided rib pain/intercostal pain yesterday.  Intermittent.  No recent fall, trauma, injury.  He [place] very [place]  No relieving factors.  No other associated symptoms.  Additionally, patient reports that approximate 3 weeks ago he injured his right fifth digit.  He injured it while he was putting his hand in his pocket.  He has been splinting the area.  No significant pain at this time.  He would like it evaluated today.  PMH, Surgical Hx, Family Hx, Social History reviewed and updated as below.  Past Medical History:  Diagnosis Date  . Allergic rhinitis, cause unspecified   . BPH without obstruction/lower urinary tract symptoms   . Congenital hypertrophic pyloric stenosis   . Esophageal reflux   . Heart burn   . History of kidney stones   . Other chest pain   . Panic disorder without agoraphobia   . Unspecified asthma(493.90)    Patient Active Problem List   Diagnosis Date Noted  . Moderate persistent allergic asthma 03/02/2017  . Chronic low back pain 03/02/2017  . Right foot pain 03/02/2017  . Left ureteral stone 08/07/2015  . Hydronephrosis, left 08/07/2015  . BPH with obstruction/lower urinary tract symptoms 08/07/2015  . Allergic rhinitis 02/03/2007  . G E R D 01/11/2007   Past Surgical History:  Procedure Laterality Date  . herniated disc-back surgery  1996  . Stenosis surgery (other)  1960s   hx of pyloric stenosis as infant    Home Medications    Prior to Admission medications   Medication Sig Start Date End Date Taking? Authorizing Provider  Acetaminophen (TYLENOL 8 HOUR PO) Take by mouth.   Yes [provider]  albuterol (VENTOLIN HFA)  108 (90 Base) MCG/ACT inhaler Inhale 1-2 puffs into the lungs every 6 (six) hours as needed for wheezing or shortness of breath. 03/02/17  Yes Bedsole, Amy E, MD  Cyanocobalamin (VITAMIN B 12 PO) Take by mouth.   Yes [provider]  meloxicam (MOBIC) 7.5 MG tablet TAKE 1 TABLET (7.5 MG TOTAL) 2 (TWO) TIMES DAILY BY MOUTH. 02/14/18  Yes Bedsole, Amy E, MD  omeprazole (PRILOSEC) 40 MG capsule Take 40 mg by mouth daily.     Yes [provider]  SYMBICORT 80-4.5 MCG/ACT inhaler TAKE 2 PUFFS BY MOUTH TWICE A DAY 10/25/17  Yes Bedsole, Amy E, MD  metaxalone (SKELAXIN) 800 MG tablet Take 1 tablet (800 mg total) by mouth 3 (three) times daily as needed for muscle spasms. 02/21/18   Coral Spikes, DO    Family History Family History  Problem Relation Age of Onset  . Coronary artery disease Father        stent age 91  . Hypertension Father   . Allergies Mother   . Asthma Brother   . Allergies Brother   . Hypertension Paternal Grandfather   . Kidney disease Neg Hx   . Prostate cancer Neg Hx   . Kidney cancer Neg Hx   . Bladder Cancer Neg Hx     Social History Social History   Tobacco Use  . Smoking status: Never Smoker  . Smokeless tobacco: Never  Used  Substance Use Topics  . Alcohol use: Yes    Comment: Occasionally, maybe 1/month   . Drug use: No     Allergies   Patient has no known allergies.   Review of Systems Review of Systems  Constitutional: Negative.   Respiratory: Negative.   Cardiovascular: Negative.   Musculoskeletal:       Right 5th digit injury. Left rib pain.   Physical Exam Triage Vital Signs ED Triage Vitals  Enc Vitals Group     BP 02/21/18 1358 128/85     Pulse Rate 02/21/18 1358 64     Resp 02/21/18 1358 16     Temp 02/21/18 1358 98.3 F (36.8 C)     Temp Source 02/21/18 1358 Oral     SpO2 02/21/18 1358 95 %     Weight 02/21/18 1357 164 lb (74.4 kg)     Height 02/21/18 1357 5\' 11"  (1.803 m)     Head Circumference --      Peak Flow  --      Pain Score 02/21/18 1355 3     Pain Loc --      Pain Edu? --      Excl. in [place]? --    No data found.  Updated Vital Signs BP 128/85 (BP Location: Left Arm)   Pulse 64   Temp 98.3 F (36.8 C) (Oral)   Resp 16   Ht 5\' 11"  (1.803 m)   Wt 74.4 kg   SpO2 95%   BMI 22.87 kg/m   Visual Acuity Right Eye Distance:   Left Eye Distance:   Bilateral Distance:    Right Eye Near:   Left Eye Near:    Bilateral Near:     Physical Exam  Constitutional: He is oriented to person, place, and time. He appears well-developed. No distress.  HENT:  Head: Normocephalic and atraumatic.  Cardiovascular: Normal rate and regular rhythm.  Pulmonary/Chest: Effort normal and breath sounds normal. He has no wheezes. He has no rales.  Musculoskeletal:  Mild left lower rib (mid axillary line) intercostal tenderness. Right 5th digit -patient with flexion of the DIP joint.  Patient unable to fully extend.  Clinically consistent with mallet finger.  Neurological: He is alert and oriented to person, place, and time.  Psychiatric: He has a normal mood and affect. His behavior is normal.  Nursing note and vitals reviewed.  UC Treatments / Results  Labs (all labs ordered are listed, but only abnormal results are displayed) Labs Reviewed - No data to display  EKG None  Radiology No results found.  Procedures Procedures (including critical care time)  Medications Ordered in UC Medications - No data to display  Initial Impression / Assessment and Plan / UC Course  I have reviewed the triage vital signs and the nursing notes.  Pertinent labs & imaging results that were available during my care of the patient were reviewed by me and considered in my medical decision making (see chart for details).    52 year old male presents with an intercostal muscle strain.  Continue Mobic.  Skelaxin as needed.  Additionally, patient has a mallet finger.  He is not particular bothered by it.  We discussed  splinting versus no intervention. Patient elected for no intervention.  Final Clinical Impressions(s) / UC Diagnoses   Final diagnoses:  Intercostal muscle strain, initial encounter     Discharge Instructions     Continue the mobic.  Use the muscle relaxer as needed.  Take care  Dr. Lacinda Axon    ED Prescriptions    Medication Sig Dispense Auth. Provider   metaxalone (SKELAXIN) 800 MG tablet Take 1 tablet (800 mg total) by mouth 3 (three) times daily as needed for muscle spasms. 30 tablet Coral Spikes, DO     Controlled Substance Prescriptions [place] Controlled Substance Registry consulted? Not Applicable   Coral Spikes, DO 02/21/18 1535

## 2018-02-21 NOTE — Discharge Instructions (Signed)
Continue the mobic.  Use the muscle relaxer as needed.  Take care  Dr. Lacinda Axon

## 2018-02-21 NOTE — ED Triage Notes (Signed)
Pt c/o pain in his left rib area. Started yesterday. Denies SOB, dizziness, sweats, or nausea. Pt states it is an intermittent pain. Pt also was putting his hand in his pocket ans felt his finger "pop" he can not straighten out his finger.

## 2018-03-29 ENCOUNTER — Telehealth: Payer: Self-pay | Admitting: *Deleted

## 2018-03-29 NOTE — Telephone Encounter (Signed)
Left message for Blake Molina that as far as I can see, no one from our office called him.  I advised it may have been a Robo-Call to remind him to get his flu vaccine.

## 2018-03-29 NOTE — Telephone Encounter (Signed)
Copied from [place] [id number]. Topic: General - Other >> Mar 29, 2018 10:33 AM Yvette Rack wrote: Reason for CRM: Pt returned call to office. Pt states his wife answered the home phone but no information was left. Pt requests call back. Cb# [phone]

## 2018-04-11 IMAGING — MR MR LUMBAR SPINE WO/W CM
5 of 7 series · 31 of 48 positions shown · IV contrast (multihance)
Comparison: Lumbar spine x-rays dated March 02, 2017. CT
abdomen pelvis dated August 04, 2015.

CLINICAL DATA: Low back pain radiating into the left leg after
heavy lifting injury 2 months ago.

EXAM:
MRI LUMBAR SPINE WITHOUT AND WITH CONTRAST
TECHNIQUE: Multiplanar and multiecho pulse sequences of the lumbar spine were
obtained without and with intravenous contrast.
CONTRAST:  14mL MULTIHANCE GADOBENATE DIMEGLUMINE 529 MG/ML IV SOLN

[Series 2: T2 · sagittal · 4.0mm · 0.81mm/px · 3 of 15 slices shown (1 of 2)]
[im 1/15]
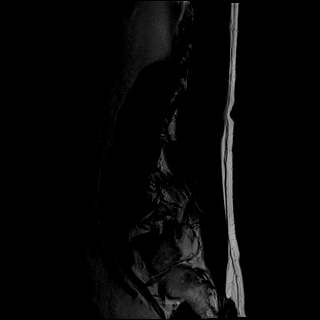
[im 8/15]
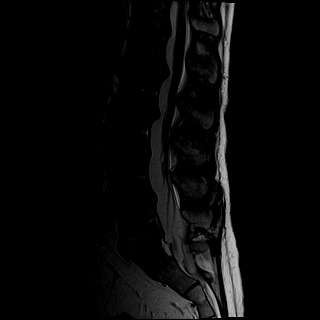
[im 15/15]
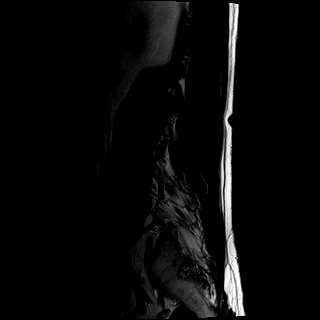

[Series 3: T1 · sagittal · 4.0mm · 0.81mm/px · 4 of 15 slices shown (1 of 2)]
[im 1/15]
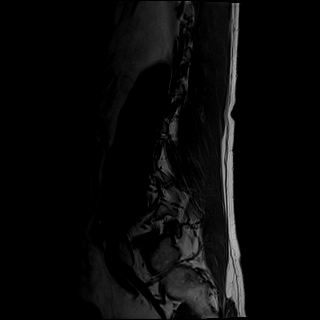
[im 5/15]
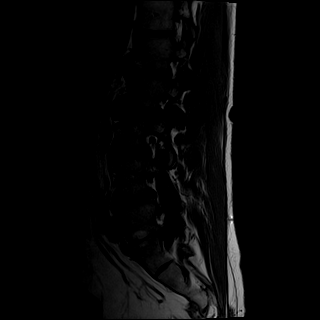
[im 10/15]
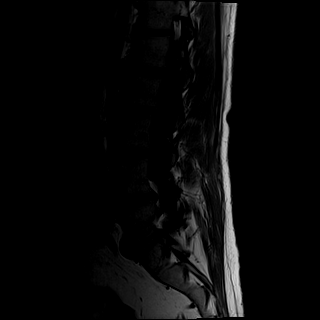
[im 15/15]
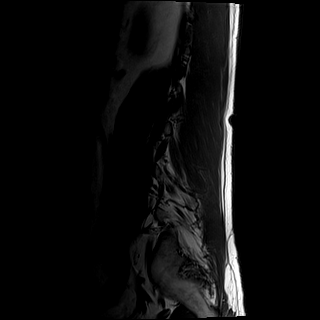

[Series 5: T2 · axial · 4.0mm · 0.78mm/px · z∈[-77,+141]mm · 11 of 41 slices shown (2 of 2)]
[im 1/41]
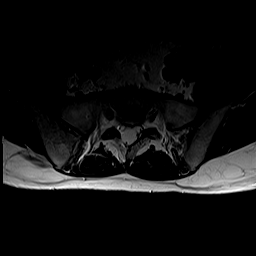
[im 5/41]
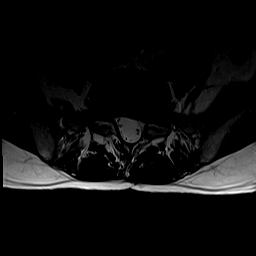
[im 9/41]
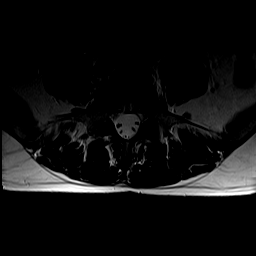
[im 13/41]
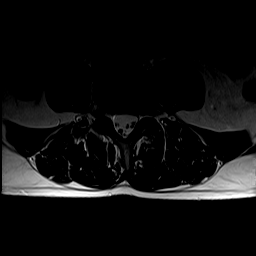
[im 17/41]
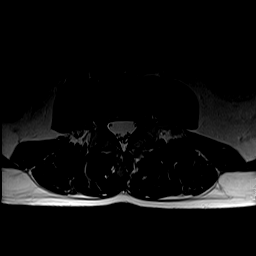
[im 21/41]
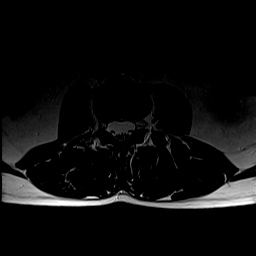
[im 25/41]
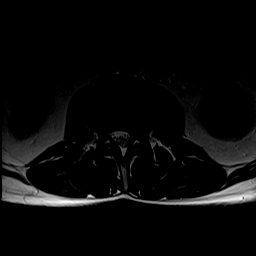
[im 29/41]
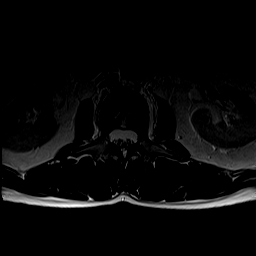
[im 33/41]
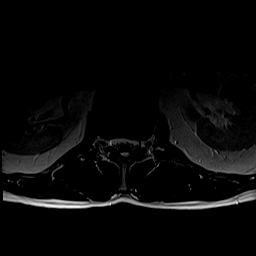
[im 37/41]
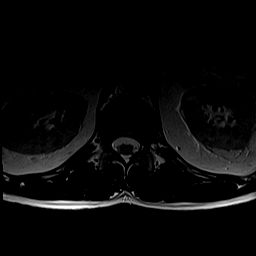
[im 41/41]
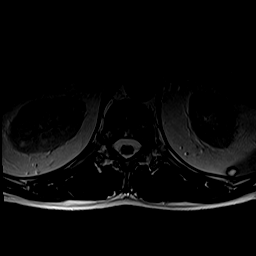

[Series 6: T1 · axial · 4.0mm · 0.39mm/px · z∈[-77,+141]mm · 11 of 41 slices shown (2 of 2)]
[im 1/41]
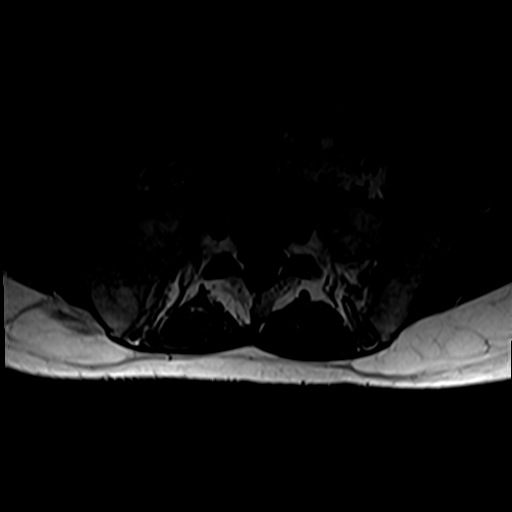
[im 5/41]
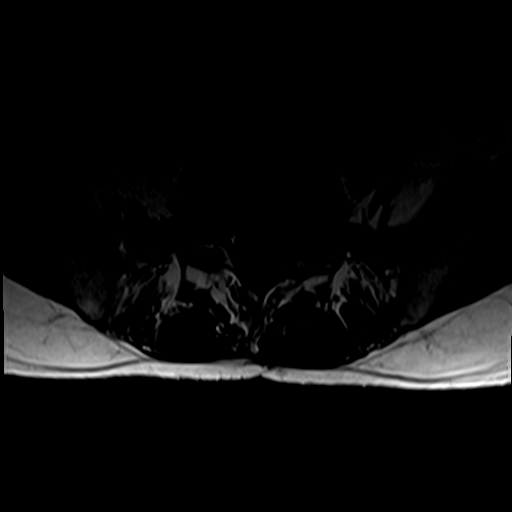
[im 9/41]
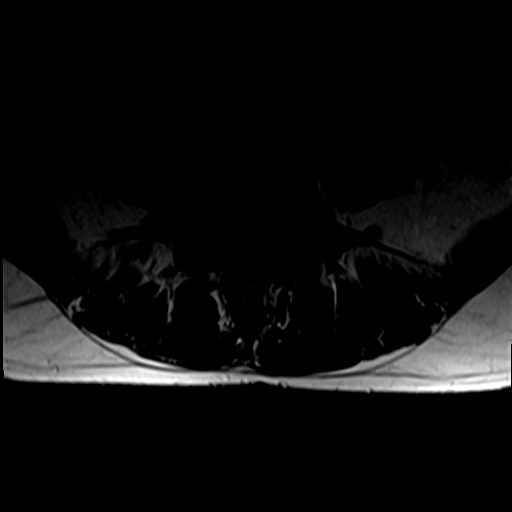
[im 13/41]
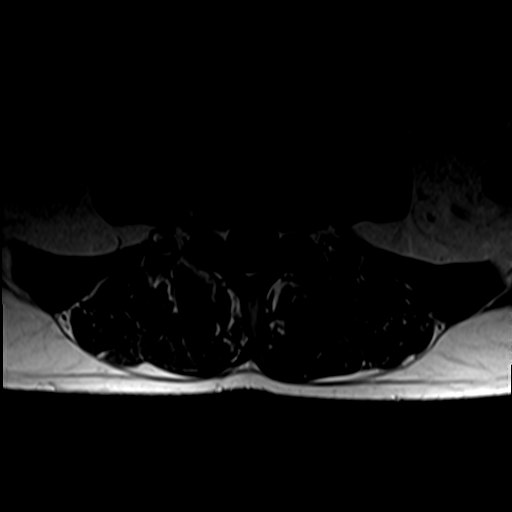
[im 17/41]
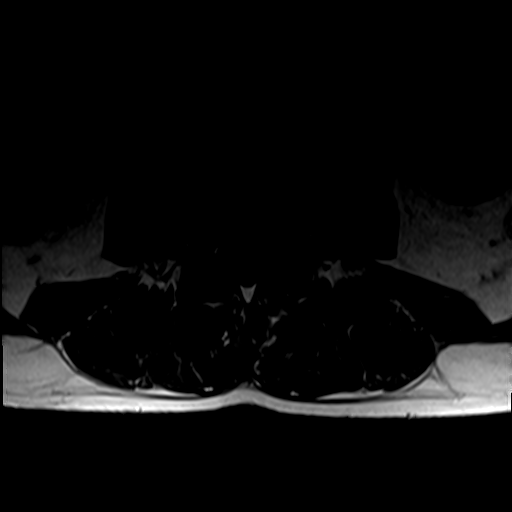
[im 21/41]
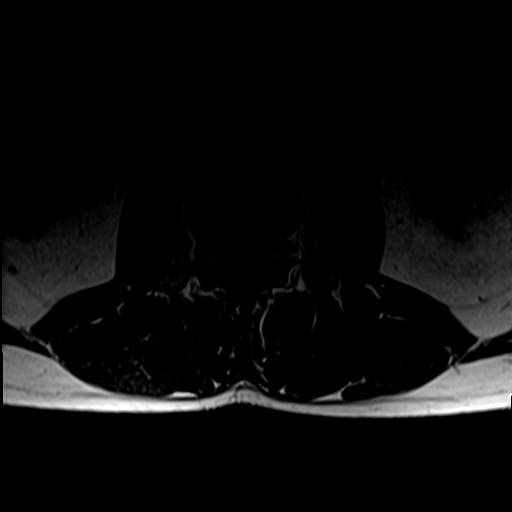
[im 25/41]
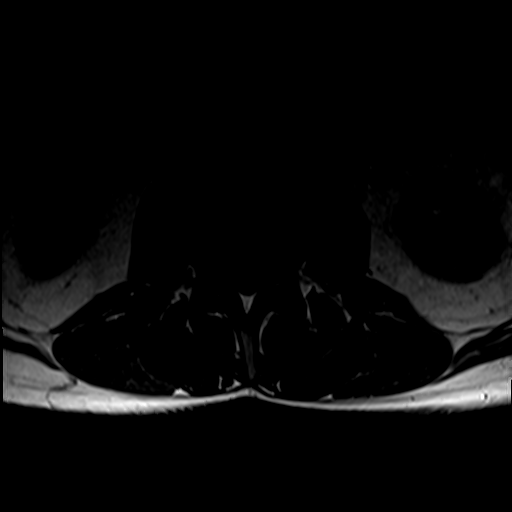
[im 29/41]
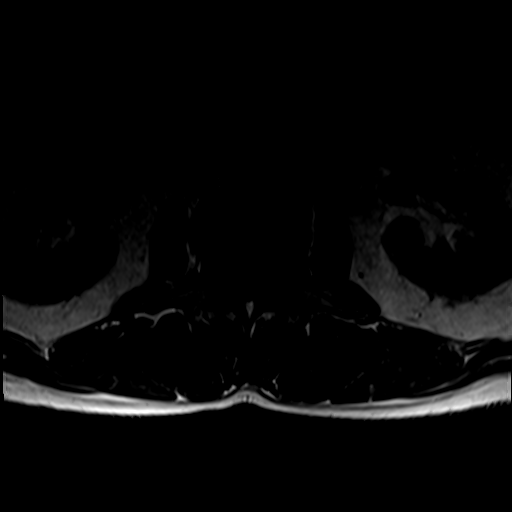
[im 33/41]
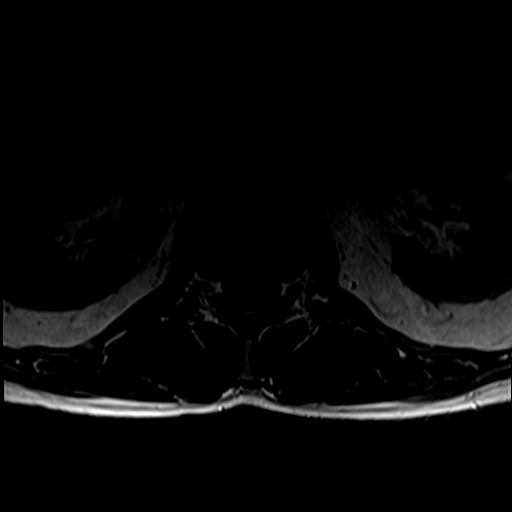
[im 37/41]
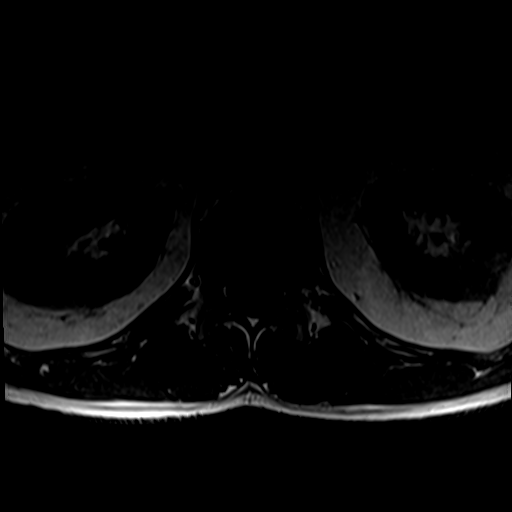
[im 41/41]
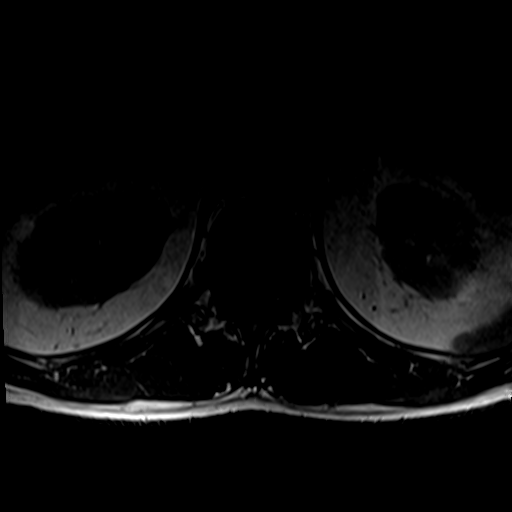

[Series 7: T1 fat-sat post-contrast · sagittal · 4.0mm · 0.81mm/px · 2 of 15 slices shown]
[im 1/15]
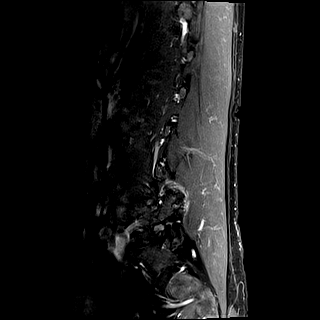
[im 5/15]
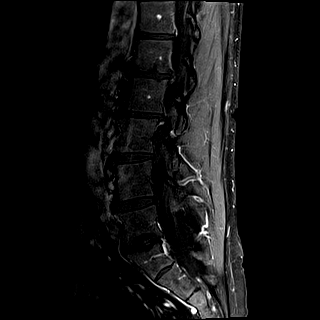

[31 of 48 positions shown; findings below may reference images not displayed]

FINDINGS: Segmentation:  Standard.

Alignment: Trace retrolisthesis of L2 on L3 and L5 on S1, unchanged.

Vertebrae: No fracture or evidence of discitis. There is a 12 mm T2
hyperintense, T1 hypointense, enhancing lesion in the left aspect of
S2, not clearly seen on prior CT acute.

Conus medullaris: Extends to the L2 level and appears normal. No
abnormal enhancement.

Paraspinal and other soft tissues: Negative.

Disc levels:

T12-L1: Tiny central disc protrusion without spinal canal or
neuroforaminal stenosis.

L1-L2: Small diffuse disc bulge resulting in mild central spinal
canal stenosis. No significant neuroforaminal stenosis.

L2-L3: Diffuse disc bulge resulting in mild central spinal canal
stenosis and mild left subarticular narrowing. No significant
neuroforaminal stenosis.

L3-L4: Tiny diffuse disc bulge and mild bilateral facet arthropathy
without significant spinal canal or neuroforaminal stenosis.

L4-L5: Small diffuse disc bulge and mild bilateral facet arthropathy
with ligamentum flavum hypertrophy resulting in mild right greater
than left neuroforaminal stenosis. No significant central spinal
canal stenosis.

L5-S1: Prominent disc height loss and disc osteophyte complex,
slightly asymmetric to the left, with endplate spurring and mild
bilateral facet arthropathy, resulting in moderate to severe
bilateral neuroforaminal stenosis. Mild left subarticular narrowing.
No significant central spinal canal stenosis.
IMPRESSION: 1. Multilevel degenerative changes of the lumbar spine, worst at
L5-S1 where there is moderate to severe bilateral neuroforaminal
stenosis. Disc osteophyte complex at this level may also abut the
descending left S1 nerve root.
2. Additional mild central spinal canal stenosis at L1-L2 and L2-L3.
3. Small 12 mm enhancing lesion in the left aspect of S2,
nonspecific. Recommend follow-up contrast-enhanced MRI in 6 months
to evaluate for interval change.

## 2018-05-14 ENCOUNTER — Other Ambulatory Visit: Payer: Self-pay | Admitting: Family Medicine

## 2018-05-15 NOTE — Telephone Encounter (Signed)
Last office visit 08/04/2017 for CPE.  Last refilled 02/14/2018 for #[id number] with no refills.  No future appointments.

## 2018-08-10 ENCOUNTER — Other Ambulatory Visit: Payer: Self-pay

## 2018-08-10 ENCOUNTER — Encounter: Payer: Self-pay | Admitting: Emergency Medicine

## 2018-08-10 ENCOUNTER — Ambulatory Visit
Admission: EM | Admit: 2018-08-10 | Discharge: 2018-08-10 | Disposition: A | Discharge status: Home / Self Care | Payer: BLUE CROSS/BLUE SHIELD | Attending: Family Medicine | Admitting: Family Medicine

## 2018-08-10 DIAGNOSIS — R030 Elevated blood-pressure reading, without diagnosis of hypertension: Secondary | ICD-10-CM

## 2018-08-10 NOTE — ED Triage Notes (Signed)
Patient c/o headache that started this morning. He checked his BP and it was 186/116. No history of high blood pressure.

## 2018-08-10 NOTE — ED Provider Notes (Signed)
[facility]    CSN: [id number] Arrival date & time: 08/10/18  1931  History   Chief Complaint Chief Complaint  Patient presents with  . Hypertension   HPI  53 year old male presents with headache and elevated blood pressure.  Patient states that he had a severe headache this morning.  He states that he checked his blood pressure and it was 186/116.  Patient states that his blood pressure has improved upon recheck.  His headache has currently subsided.  No chest pain.  No shortness of breath.  No vision changes.  He feels well currently.  Patient concerned given his recent headache and high blood pressure.  Patient has no history of hypertension.  No other reported symptoms.  No other complaints.  PMH, Surgical Hx, Family Hx, Social History reviewed and updated as below.  Past Medical History:  Diagnosis Date  . Allergic rhinitis, cause unspecified   . BPH without obstruction/lower urinary tract symptoms   . Congenital hypertrophic pyloric stenosis   . Esophageal reflux   . Heart burn   . History of kidney stones   . Other chest pain   . Panic disorder without agoraphobia   . Unspecified asthma(493.90)     Patient Active Problem List   Diagnosis Date Noted  . Moderate persistent allergic asthma 03/02/2017  . Chronic low back pain 03/02/2017  . Right foot pain 03/02/2017  . Left ureteral stone 08/07/2015  . Hydronephrosis, left 08/07/2015  . BPH with obstruction/lower urinary tract symptoms 08/07/2015  . Allergic rhinitis 02/03/2007  . G E R D 01/11/2007    Past Surgical History:  Procedure Laterality Date  . herniated disc-back surgery  1996  . Stenosis surgery (other)  1960s   hx of pyloric stenosis as infant    Home Medications    Prior to Admission medications   Medication Sig Start Date End Date Taking? Authorizing Provider  Acetaminophen (TYLENOL 8 HOUR PO) Take by mouth.   Yes [provider]  albuterol (VENTOLIN HFA) 108 (90 Base)  MCG/ACT inhaler Inhale 1-2 puffs into the lungs every 6 (six) hours as needed for wheezing or shortness of breath. 03/02/17  Yes Bedsole, Amy E, MD  Cyanocobalamin (VITAMIN B 12 PO) Take by mouth.   Yes [provider]  meloxicam (MOBIC) 7.5 MG tablet TAKE 1 TABLET (7.5 MG TOTAL) 2 (TWO) TIMES DAILY BY MOUTH. 05/15/18  Yes Bedsole, Amy E, MD  omeprazole (PRILOSEC) 40 MG capsule Take 40 mg by mouth daily.     Yes [provider]  SYMBICORT 80-4.5 MCG/ACT inhaler TAKE 2 PUFFS BY MOUTH TWICE A DAY 10/25/17  Yes Bedsole, Amy E, MD    Family History Family History  Problem Relation Age of Onset  . Coronary artery disease Father        stent age 53  . Hypertension Father   . Allergies Mother   . Asthma Brother   . Allergies Brother   . Hypertension Paternal Grandfather   . Kidney disease Neg Hx   . Prostate cancer Neg Hx   . Kidney cancer Neg Hx   . Bladder Cancer Neg Hx     Social History Social History   Tobacco Use  . Smoking status: Never Smoker  . Smokeless tobacco: Never Used  Substance Use Topics  . Alcohol use: Yes    Comment: Occasionally, maybe 1/month   . Drug use: No     Allergies   Patient has no known allergies.   Review of  Systems Review of Systems  Eyes: Negative for visual disturbance.  Cardiovascular: Negative.   Neurological: Positive for headaches.   Physical Exam Triage Vital Signs ED Triage Vitals  Enc Vitals Group     BP 08/10/18 1953 (!) 154/96     Pulse Rate 08/10/18 1953 (!) 59     Resp 08/10/18 1953 18     Temp 08/10/18 1953 98.2 F (36.8 C)     Temp Source 08/10/18 1953 Oral     SpO2 08/10/18 1953 100 %     Weight 08/10/18 1952 165 lb (74.8 kg)     Height 08/10/18 1952 5\' 11"  (1.803 m)     Head Circumference --      Peak Flow --      Pain Score 08/10/18 1951 1     Pain Loc --      Pain Edu? --      Excl. in [place]? --    Updated Vital Signs BP 130/90 (BP Location: Left Arm)   Pulse (!) 59   Temp 98.2 F (36.8 C)  (Oral)   Resp 18   Ht 5\' 11"  (1.803 m)   Wt 74.8 kg   SpO2 100%   BMI 23.01 kg/m   Visual Acuity Right Eye Distance:   Left Eye Distance:   Bilateral Distance:    Right Eye Near:   Left Eye Near:    Bilateral Near:     Physical Exam Vitals signs and nursing note reviewed.  Constitutional:      General: He is not in acute distress.    Appearance: Normal appearance.  HENT:     Head: Normocephalic and atraumatic.  Eyes:     General:        Right eye: No discharge.        Left eye: No discharge.     Conjunctiva/sclera: Conjunctivae normal.  Cardiovascular:     Rate and Rhythm: Normal rate and regular rhythm.  Pulmonary:     Effort: Pulmonary effort is normal.     Breath sounds: Normal breath sounds.  Neurological:     Mental Status: He is alert.  Psychiatric:        Mood and Affect: Mood normal.        Behavior: Behavior normal.     UC Treatments / Results  Labs (all labs ordered are listed, but only abnormal results are displayed) Labs Reviewed - No data to display  EKG None  Radiology No results found.  Procedures Procedures (including critical care time)  Medications Ordered in UC Medications - No data to display  Initial Impression / Assessment and Plan / UC Course  I have reviewed the triage vital signs and the nursing notes.  Pertinent labs & imaging results that were available during my care of the patient were reviewed by me and considered in my medical decision making (see chart for details).    53 year old male presents with recent headache and elevated blood pressure without the diagnosis of hypertension.  Blood pressure improved upon recheck here.  Patient needs additional blood pressure readings to confirm hypertension.  Advised daily BP monitoring.  Consider holding meloxicam as NSAIDs can affect blood pressure.  Follow-up closely with PCP.  Final Clinical Impressions(s) / UC Diagnoses   Final diagnoses:  Elevated BP without diagnosis of  hypertension     Discharge Instructions     Consider holding the mobic.  Monitor BP daily.  Follow up with PCP.  Take care  Dr. Lacinda Axon  ED Prescriptions    None     Controlled Substance Prescriptions [place] Controlled Substance Registry consulted? Not Applicable   Coral Spikes, DO 08/10/18 2128

## 2018-08-10 NOTE — Discharge Instructions (Addendum)
Consider holding the mobic.  Monitor BP daily.  Follow up with PCP.  Take care  Dr. Lacinda Axon

## 2018-08-11 ENCOUNTER — Telehealth: Payer: Self-pay | Admitting: Family Medicine

## 2018-08-11 MED ORDER — HYDROCHLOROTHIAZIDE 25 MG PO TABS: 25.0000 mg | ORAL_TABLET | Freq: Every day | ORAL | 1 refills | Status: DC

## 2018-08-11 NOTE — Telephone Encounter (Signed)
Patient called reporting continued elevated BP. Cannot see PCP for 2 weeks.  Starting on HCTZ.  [facility]

## 2018-08-14 ENCOUNTER — Ambulatory Visit
Admission: EM | Admit: 2018-08-14 | Discharge: 2018-08-14 | Disposition: A | Discharge status: Home / Self Care | Payer: BLUE CROSS/BLUE SHIELD | Attending: Family Medicine | Admitting: Family Medicine

## 2018-08-14 ENCOUNTER — Other Ambulatory Visit: Payer: Self-pay

## 2018-08-14 ENCOUNTER — Encounter: Payer: Self-pay | Admitting: Emergency Medicine

## 2018-08-14 DIAGNOSIS — R079 Chest pain, unspecified: Secondary | ICD-10-CM | POA: Diagnosis not present

## 2018-08-14 DIAGNOSIS — R0602 Shortness of breath: Secondary | ICD-10-CM | POA: Insufficient documentation

## 2018-08-14 NOTE — ED Provider Notes (Signed)
[facility]    CSN: [id number] Arrival date & time: 08/14/18  1731   History   Chief Complaint Chief Complaint  Patient presents with  . Shortness of Breath   HPI  53 year old male presents with shortness of breath.  Patient has recently been seen here.  I have recently started him on HCTZ for hypertension.  Blood pressure markedly improved.  Patient reports that he has had some ongoing intermittent shortness of breath.  He states that he has been under a great deal of stress.  Today he states that he developed shortness of breath and felt dizzy.  He states that it has been intermittent.  He is currently feeling well.  He has had some "chest pressure".  Denies pain.  Is concerned that this may be an adverse medication side effect.  He states that this does not feel like asthma.  No known exacerbating or relieving factors.  No other complaints.  PMH, Surgical Hx, Family Hx, Social History reviewed and updated as below.  Past Medical History:  Diagnosis Date  . Allergic rhinitis, cause unspecified   . BPH without obstruction/lower urinary tract symptoms   . Congenital hypertrophic pyloric stenosis   . Esophageal reflux   . Heart burn   . History of kidney stones   . Other chest pain   . Panic disorder without agoraphobia   . Unspecified asthma(493.90)     Patient Active Problem List   Diagnosis Date Noted  . Moderate persistent allergic asthma 03/02/2017  . Chronic low back pain 03/02/2017  . Right foot pain 03/02/2017  . Left ureteral stone 08/07/2015  . Hydronephrosis, left 08/07/2015  . BPH with obstruction/lower urinary tract symptoms 08/07/2015  . Allergic rhinitis 02/03/2007  . G E R D 01/11/2007    Past Surgical History:  Procedure Laterality Date  . herniated disc-back surgery  1996  . Stenosis surgery (other)  1960s   hx of pyloric stenosis as infant   Home Medications    Prior to Admission medications   Medication Sig Start Date End Date  Taking? Authorizing Provider  Acetaminophen (TYLENOL 8 HOUR PO) Take by mouth.   Yes [provider]  albuterol (VENTOLIN HFA) 108 (90 Base) MCG/ACT inhaler Inhale 1-2 puffs into the lungs every 6 (six) hours as needed for wheezing or shortness of breath. 03/02/17  Yes Bedsole, Amy E, MD  hydrochlorothiazide (HYDRODIURIL) 25 MG tablet Take 1 tablet (25 mg total) by mouth daily. 08/11/18  Yes Saranda Legrande G, DO  meloxicam (MOBIC) 7.5 MG tablet TAKE 1 TABLET (7.5 MG TOTAL) 2 (TWO) TIMES DAILY BY MOUTH. 05/15/18  Yes Bedsole, Amy E, MD  omeprazole (PRILOSEC) 40 MG capsule Take 40 mg by mouth daily.     Yes [provider]  Cyanocobalamin (VITAMIN B 12 PO) Take by mouth.    [provider]  SYMBICORT 80-4.5 MCG/ACT inhaler TAKE 2 PUFFS BY MOUTH TWICE A DAY 10/25/17   Jinny Sanders, MD    Family History Family History  Problem Relation Age of Onset  . Coronary artery disease Father        stent age 60  . Hypertension Father   . Allergies Mother   . Asthma Brother   . Allergies Brother   . Hypertension Paternal Grandfather   . Kidney disease Neg Hx   . Prostate cancer Neg Hx   . Kidney cancer Neg Hx   . Bladder Cancer Neg Hx     Social History Social History  Tobacco Use  . Smoking status: Never Smoker  . Smokeless tobacco: Never Used  Substance Use Topics  . Alcohol use: Yes    Comment: Occasionally, maybe 1/month   . Drug use: No     Allergies   Patient has no known allergies.   Review of Systems Review of Systems  Constitutional: Negative.   Respiratory: Positive for shortness of breath.   Neurological: Positive for dizziness.   Physical Exam Triage Vital Signs ED Triage Vitals  Enc Vitals Group     BP 08/14/18 1758 (!) 133/95     Pulse Rate 08/14/18 1758 76     Resp 08/14/18 1758 18     Temp 08/14/18 1758 98.2 F (36.8 C)     Temp Source 08/14/18 1758 Oral     SpO2 08/14/18 1757 100 %     Weight 08/14/18 1756 165 lb (74.8 kg)      Height 08/14/18 1756 5\' 11"  (1.803 m)     Head Circumference --      Peak Flow --      Pain Score 08/14/18 1756 0     Pain Loc --      Pain Edu? --      Excl. in [place]? --    Updated Vital Signs BP (!) 133/95 (BP Location: Left Arm)   Pulse 76   Temp 98.2 F (36.8 C) (Oral)   Resp 18   Ht 5\' 11"  (1.803 m)   Wt 74.8 kg   SpO2 99%   BMI 23.01 kg/m   Visual Acuity Right Eye Distance:   Left Eye Distance:   Bilateral Distance:    Right Eye Near:   Left Eye Near:    Bilateral Near:     Physical Exam Vitals signs and nursing note reviewed.  Constitutional:      General: He is not in acute distress.    Appearance: Normal appearance.  HENT:     Head: Normocephalic and atraumatic.  Eyes:     General: No scleral icterus.    Conjunctiva/sclera: Conjunctivae normal.  Cardiovascular:     Rate and Rhythm: Normal rate and regular rhythm.  Pulmonary:     Effort: Pulmonary effort is normal.     Breath sounds: Normal breath sounds.  Neurological:     Mental Status: He is alert.  Psychiatric:        Mood and Affect: Mood normal.        Behavior: Behavior normal.     UC Treatments / Results  Labs (all labs ordered are listed, but only abnormal results are displayed) Labs Reviewed - No data to display  EKG Normal sinus rhythm at a rate of 89.  RSR prime noted in V1.  No bundle-branch block.  No ST-T wave changes.  Normal axis.  Radiology No results found.  Procedures Procedures (including critical care time)  Medications Ordered in UC Medications - No data to display  Initial Impression / Assessment and Plan / UC Course  I have reviewed the triage vital signs and the nursing notes.  Pertinent labs & imaging results that were available during my care of the patient were reviewed by me and considered in my medical decision making (see chart for details).    53 year old male presents with shortness of breath and dizziness.  I do not feel that this is secondary to  medication side effect.  He is well currently.  EKG with no evidence of ischemia.  He is under a great deal of  stress. I suspect that this is likely anxiety.  Advised to follow-up with his primary care physician.  Final Clinical Impressions(s) / UC Diagnoses   Final diagnoses:  SOB (shortness of breath)     Discharge Instructions     EKG negative.  Continue the medication.  Suspect anxiety.  Take care  Dr. Lacinda Axon    ED Prescriptions    None     Controlled Substance Prescriptions [place] Controlled Substance Registry consulted? Not Applicable   Coral Spikes, DO 08/14/18 2113

## 2018-08-14 NOTE — Discharge Instructions (Signed)
EKG negative.  Continue the medication.  Suspect anxiety.  Take care  Dr. Lacinda Axon

## 2018-08-14 NOTE — ED Triage Notes (Signed)
Patient states his blood pressure has been elevated and he was given BP medicine. Today he is feeling some dizziness shortness of breath.

## 2018-08-15 ENCOUNTER — Ambulatory Visit: Payer: BLUE CROSS/BLUE SHIELD | Admitting: Family Medicine

## 2018-08-15 ENCOUNTER — Encounter: Payer: Self-pay | Admitting: Family Medicine

## 2018-08-15 ENCOUNTER — Encounter: Payer: Self-pay | Admitting: *Deleted

## 2018-08-15 VITALS — BP 104/68 | HR 81 | Temp 98.2°F | Ht 70.75 in | Wt 167.4 lb

## 2018-08-15 DIAGNOSIS — I10 Essential (primary) hypertension: Secondary | ICD-10-CM

## 2018-08-15 DIAGNOSIS — Z566 Other physical and mental strain related to work: Secondary | ICD-10-CM

## 2018-08-15 LAB — COMPREHENSIVE METABOLIC PANEL
ALBUMIN: 4.6 g/dL (ref 3.5–5.2)
ALT: 18 U/L (ref 0–53)
AST: 21 U/L (ref 0–37)
Alkaline Phosphatase: 57 U/L (ref 39–117)
BILIRUBIN TOTAL: 0.6 mg/dL (ref 0.2–1.2)
BUN: 25 mg/dL — AB (ref 6–23)
CO2: 28 mEq/L (ref 19–32)
Calcium: 10 mg/dL (ref 8.4–10.5)
Chloride: 102 mEq/L (ref 96–112)
Creatinine, Ser: 0.97 mg/dL (ref 0.40–1.50)
GFR: 81.2 mL/min (ref >60.00)
Glucose, Bld: 86 mg/dL (ref 70–99)
POTASSIUM: 4.8 meq/L (ref 3.5–5.1)
SODIUM: 137 meq/L (ref 135–145)
TOTAL PROTEIN: 6.9 g/dL (ref 6.0–8.3)

## 2018-08-15 LAB — CBC WITH DIFFERENTIAL/PLATELET
BASOS ABS: 0.1 10*3/uL (ref 0.0–0.1)
Basophils Relative: 1.1 % (ref 0.0–3.0)
EOS ABS: 0.5 10*3/uL (ref 0.0–0.7)
Eosinophils Relative: 9 % — ABNORMAL HIGH (ref 0.0–5.0)
HCT: 46.8 % (ref 39.0–52.0)
Hemoglobin: 15.6 g/dL (ref 13.0–17.0)
LYMPHS ABS: 1.7 10*3/uL (ref 0.7–4.0)
Lymphocytes Relative: 29.2 % (ref 12.0–46.0)
MCHC: 33.4 g/dL (ref 30.0–36.0)
MCV: 86.8 fl (ref 78.0–100.0)
MONO ABS: 0.5 10*3/uL (ref 0.1–1.0)
Monocytes Relative: 8.2 % (ref 3.0–12.0)
NEUTROS PCT: 52.5 % (ref 43.0–77.0)
Neutro Abs: 3.1 10*3/uL (ref 1.4–7.7)
PLATELETS: 307 10*3/uL (ref 150.0–400.0)
RBC: 5.39 Mil/uL (ref 4.22–5.81)
RDW: 13.3 % (ref 11.5–15.5)
WBC: 6 10*3/uL (ref 4.0–10.5)

## 2018-08-15 LAB — PSA: PSA: 0.77 ng/mL (ref 0.10–4.00)

## 2018-08-15 LAB — LIPID PANEL
CHOLESTEROL: 173 mg/dL (ref 0–200)
HDL: 49.9 mg/dL (ref >39.00)
LDL Cholesterol: 113 mg/dL — ABNORMAL HIGH (ref 0–99)
NonHDL: 123.5
Total CHOL/HDL Ratio: 3
Triglycerides: 51 mg/dL (ref 0.0–149.0)
VLDL: 10.2 mg/dL (ref 0.0–40.0)

## 2018-08-15 LAB — TSH: TSH: 0.82 u[IU]/mL (ref 0.35–4.50)

## 2018-08-15 NOTE — Progress Notes (Signed)
   Subjective:    Patient ID: Blake Molina, male    DOB: 05-19-1966, 53 y.o.   MRN: [id number]  HPI   53 year old male with new diagnosis HTN presents with new onset headaches. He never has headaches.. so checked BP... was high and went to urgent care.  Urgent care visit 08/10/2018 for severe headache and HTN BP 186/116 Held meloxicam ( he did not hold given cannot wk withhout it, on low dose) and started on HCTZ   Returned to Urgent Care yesterday with SOB, nonexertional chest pressure for 30-40 second as.  EKG unremarkable  Under a lot of stress. Dr. Lacinda Axon felt possible anxiety.  Today lighthededness  Associated with HCTZ gone now.. skipped medication last night. SOB and chest pressure  Resolved on its own last night after got home from urgent care. Headache is not gone once BP done.  He has been drinking a lot of pepsis.  Social History /Family History/Past Medical History reviewed in detail and updated in EMR if needed. Blood pressure 104/68, pulse 81, temperature 98.2 F (36.8 C), height 5' 10.75" (1.797 m), weight 167 lb 6.4 oz (75.9 kg), SpO2 97 %.  Review of Systems  Constitutional: Negative for fatigue and fever.  HENT: Negative for ear pain.   Eyes: Negative for pain.  Respiratory: Negative for cough and shortness of breath.   Cardiovascular: Negative for chest pain, palpitations and leg swelling.  Gastrointestinal: Negative for abdominal pain.  Genitourinary: Negative for dysuria.  Musculoskeletal: Negative for arthralgias.  Neurological: Negative for syncope, light-headedness and headaches.  Psychiatric/Behavioral: Negative for dysphoric mood.       Objective:   Physical Exam Constitutional:      Appearance: He is well-developed.  HENT:     Head: Normocephalic.     Right Ear: Hearing normal.     Left Ear: Hearing normal.     Nose: Nose normal.  Neck:     Thyroid: No thyroid mass or thyromegaly.     Vascular: No carotid bruit.     Trachea: Trachea  normal.  Cardiovascular:     Rate and Rhythm: Normal rate and regular rhythm.     Pulses: Normal pulses.     Heart sounds: Heart sounds not distant. No murmur. No friction rub. No gallop.      Comments: No peripheral edema Pulmonary:     Effort: Pulmonary effort is normal. No respiratory distress.     Breath sounds: Normal breath sounds.  Skin:    General: Skin is warm and dry.     Findings: No rash.  Psychiatric:        Speech: Speech normal.        Behavior: Behavior normal.        Thought Content: Thought content normal.           Assessment & Plan:

## 2018-08-15 NOTE — Patient Instructions (Addendum)
Please stop at the lab to have labs drawn.  Do not take decongestants.  Follow BP at home over the next week, goal < 140/90.. Bring the measurements to next appointment  to determine if BP med needed.  Work on decreasing soda and caffeine, stress reduction.

## 2018-08-15 NOTE — Assessment & Plan Note (Signed)
Stress reduction, relaxation.   GAD9/21... re-eval next OV.

## 2018-08-15 NOTE — Assessment & Plan Note (Signed)
Eval for secondary cause with labs.  Stay offf HCTZ given SE... follow BPs at home.. re measure at next OV.

## 2018-08-21 NOTE — Telephone Encounter (Signed)
[facility] Patient Name: Blake Molina Gender: Unknown DOB: 14-Feb-1966 Age: 53 Y 3 M 5 D Return Phone Number: [phone] (Primary) Address: City/State/Zip: [place] [address] Client [facility] Night - Client Client Site [place] Physician [name] - MD Contact Type Call Who Is Calling Patient / Member / Family / Caregiver Call Type Triage / Clinical Relationship To Patient Self Return Phone Number [phone] (Primary) Chief Complaint Blood Pressure High Reason for Call Symptomatic / Request for Health Information Initial Comment Caller states he was seen on tuesday with a BP issue. Was told to call if it started going high again. Currently 162/109. Translation No Nurse Assessment Nurse: Quay Burow, RN, Rodena Piety Date/Time (Eastern Time): 08/19/2018 1:33:28 AM Confirm and document reason for call. If symptomatic, describe symptoms. ---Caller states his BP 162/109. He was seen in the office Tuesday and he is currently not on BP medication. MD said to monitor and call if it was elevated. Has the patient traveled to [place], [place], [place], [place], or [place] OR had close contact with a person known to have the novel coronavirus illness in the last 14 days? ---No Does the patient have any new or worsening symptoms? ---Yes Will a triage be completed? ---Yes Related visit to physician within the last 2 weeks? ---Yes Does the PT have any chronic conditions? (i.e. diabetes, asthma, this includes High risk factors for pregnancy, etc.) ---Yes List chronic conditions. ---chronic back pain, allergies Is the patient pregnant or possibly pregnant? (Ask all females between the ages of 39-55) ---No Is this a behavioral health or substance abuse call? ---No Guidelines Guideline Title Affirmed Question Affirmed Notes Nurse Date/Time  (Eastern Time) High Blood Pressure Systolic BP >= 601 OR Diastolic >= 093 Burns, RN, Rodena Piety 08/19/2018 1:38:21 AM Disp. Time Eilene Ghazi Time) Disposition Final User 08/19/2018 1:43:20 AM SEE PCP WITHIN 3 DAYS Yes Burns, RN, Rodena Piety PLEASE NOTE: All timestamps contained within this report are represented as [place] Standard Time. CONFIDENTIALTY NOTICE: This fax transmission is intended only for the addressee. It contains information that is legally privileged, confidential or otherwise protected from use or disclosure. If you are not the intended recipient, you are strictly prohibited from reviewing, disclosing, copying using or disseminating any of this information or taking any action in reliance on or regarding this information. If you have received this fax in error, please notify us immediately by telephone so that we can arrange for its return to [place]. Phone: [phone], Toll-Free: [phone], Fax: [phone] Page: 2 of 2 Call Id: [id number] [place] Disagree/Comply Comply Caller Understands Yes PreDisposition Go to Urgent Care/Walk-In Clinic Care Advice Given Per Guideline SEE PCP WITHIN 3 DAYS: * You need to be seen within 2 or 3 days. Call your doctor (or NP/PA) during regular office hours and make an appointment. A clinic or urgent care center are good places to go for care if your doctor's office is closed or you can't get an appointment. NOTE: If office will be open tomorrow, tell caller to call then, not in 3 days. HIGH BLOOD PRESSURE: * Untreated high blood pressure may cause damage to your heart, brain, kidneys, and eyes. * Treatment of high blood pressure can reduce the risk of stroke, heart attack, and heart failure. LIFESTYLE MODIFICATIONS - The following things can help you reduce your blood pressure: * EAT HEALTHY: Eat a diet rich in fresh fruits and vegetables, dietary  fiber, non-animal protein (e.g., soy), and low-fat dairy products. Avoid foods with a high content of saturated  fat or cholesterol. * DECREASE SODIUM INTAKE: Aim to eat less than 2.4 g (100 mmol) of sodium each day. Unfortunately 75% of the salt in the average person's diet is in pre-processed foods. * LIMIT ALCOHOL: Limit alcoholic beverages to no more than one drink per day for women and no more than two drinks for men. A drink is 1.5 oz hard liquor (one shot or jigger; 45 ml), 5 oz wine (small glass; 150 ml), 12 oz beer (one can; 360 ml). * EXERCISE, BE MORE PHYSICALLY ACTIVE: Do at least 30 minutes of aerobic exercise (e.g., brisk walking) most days of the week. Other examples of aerobic activities cycling, jogging, and swimming. * REDUCE STRESS: Find activities that help reduce your stress. Examples might include meditation, yoga, or even a restful walk in a park. CALL BACK IF: * Weakness or numbness of the face, arm or leg on one side of the body occurs * Difficulty walking, difficulty talking, or severe headache occurs * Chest pain or difficulty breathing occurs * Your blood pressure is over 180/110 * You become worse. CARE ADVICE given per High Blood Pressure (Adult) guideline. Referrals REFERRED TO PCP OFFICE

## 2018-08-21 NOTE — Telephone Encounter (Signed)
I spoke with pt and he is not having any symptoms of H/A, dizziness, SOB or CP. Last night BP 138/87. Pt felt great on 08/20/18 and 08/21/18. On 08/19/18 pt restarted HCTZ 25 mg taking 1/2 tab daily. Pt request cb with Dr Rometta Emery recommendations. If pt condition changes or worsens prior to cb pt will call [facility] or if at night will go to [facility] or ED. [facility]

## 2018-08-23 ENCOUNTER — Other Ambulatory Visit: Payer: Self-pay

## 2018-08-23 MED ORDER — MELOXICAM 7.5 MG PO TABS: 7.5000 mg | ORAL_TABLET | Freq: Two times a day (BID) | ORAL | 0 refills | Status: DC

## 2018-08-23 NOTE — Telephone Encounter (Signed)
Last office visit 08/15/2018 for HTN.  Last refilled 05/15/2018 for #180 with no refills.  CPE scheduled 08/31/2018.

## 2018-08-28 MED ORDER — LOSARTAN POTASSIUM 50 MG PO TABS: 50.0000 mg | ORAL_TABLET | Freq: Every day | ORAL | 11 refills | Status: DC

## 2018-08-28 NOTE — Telephone Encounter (Signed)
See below... also for sinus congestion.. he can use plain Mucinex BID or Coricidin. Also nasal saline irrigation or spray.  If he thinks allergy component.. Zyrtec daily and Flonase  2 sprays per nostril daily.

## 2018-08-28 NOTE — Telephone Encounter (Signed)
Blake Molina notified as instructed by telephone.  He thinks he has taken Wellbutrin in the past.  He is scheduled to follow up with Dr. Diona Browner this Thursday 08/31/2018 at 8:45 am.

## 2018-08-28 NOTE — Telephone Encounter (Signed)
Pt called back,pt is presently taking HCTZ 25 mg taking 1/2 tab daily. BP averaging 144/94. Pt thinks he is having more side effects from HCTZ; pt having a lot of nervousness and anxiety that sometimes will last all day. Pt has palpitations and jitteriness on and off. Pt has decreased caffeine but is under a lot of stress.No CP, SOB,H/A and dizziness. Pt wants to know if can change to different med for BP or try med for anxiety such as wellbutrin. No SI/HI. Pt also having head congestion and sinus H/A.No fever. Tylenol not helping and  Pt cannot take advil or meds with decongestant. Pt wants to know what can take OTC for H/A and head congestion.No cough noted.pt does not want to schedule appt because he has CPX scheduled on 08/31/18. Pt request cb.[facility].Please advise.

## 2018-08-28 NOTE — Telephone Encounter (Signed)
Have him stop HCTZ as he feel it may be causing SE. I will send in losartan for blood pressure control.. sent to [facility]. If anxiety not improving off HCTZ in next few days.. have him call back and we will start a medication to treat anxiety. Please document if he has been on one in past.

## 2018-08-31 ENCOUNTER — Ambulatory Visit (INDEPENDENT_AMBULATORY_CARE_PROVIDER_SITE_OTHER): Payer: BLUE CROSS/BLUE SHIELD | Admitting: Family Medicine

## 2018-08-31 ENCOUNTER — Encounter: Payer: Self-pay | Admitting: Family Medicine

## 2018-08-31 ENCOUNTER — Other Ambulatory Visit: Payer: Self-pay

## 2018-08-31 VITALS — BP 140/90 | HR 86 | Temp 98.3°F | Ht 69.0 in | Wt 162.5 lb

## 2018-08-31 DIAGNOSIS — I10 Essential (primary) hypertension: Secondary | ICD-10-CM | POA: Diagnosis not present

## 2018-08-31 DIAGNOSIS — Z0000 Encounter for general adult medical examination without abnormal findings: Secondary | ICD-10-CM | POA: Diagnosis not present

## 2018-08-31 DIAGNOSIS — Z1211 Encounter for screening for malignant neoplasm of colon: Secondary | ICD-10-CM

## 2018-08-31 DIAGNOSIS — F411 Generalized anxiety disorder: Secondary | ICD-10-CM | POA: Insufficient documentation

## 2018-08-31 DIAGNOSIS — F418 Other specified anxiety disorders: Secondary | ICD-10-CM | POA: Insufficient documentation

## 2018-08-31 DIAGNOSIS — J4540 Moderate persistent asthma, uncomplicated: Secondary | ICD-10-CM

## 2018-08-31 MED ORDER — BUDESONIDE-FORMOTEROL FUMARATE 80-4.5 MCG/ACT IN AERO: 2.0000 | INHALATION_SPRAY | Freq: Two times a day (BID) | RESPIRATORY_TRACT | 3 refills | Status: DC

## 2018-08-31 MED ORDER — SERTRALINE HCL 50 MG PO TABS: 50.0000 mg | ORAL_TABLET | Freq: Every day | ORAL | 3 refills | Status: DC

## 2018-08-31 NOTE — Assessment & Plan Note (Signed)
Strongly related to   BP issues. Start sertraline daily and follow up in 4 weeks for re-eval.

## 2018-08-31 NOTE — Progress Notes (Signed)
Subjective:    Patient ID: Blake Molina, male    DOB: 03/15/1966, 53 y.o.   MRN: [id number]  HPI  The patient is here for annual wellness exam and preventative care.    Hypertension:   SE to HCTZ ( possible anxiety) changed to losartan 50 mg daily.  Initial BP 124/70 but after since checking anxious during taking BP. No change change in anxiety with change to med. Using medication without problems or lightheadedness:  none Chest pain with exertion:none Edema:none Short of breath: none Average home BPs: see below Other issues:   He is very worried and anxious in last several weeks.  He has always tended to be more anxious.. was usually situational. Feels shaky anxious... has been centered around new dx HTN.  When BP being checked.. starts feeling   Stomach feels in knot, heart pounding.  Panic attack associated with checking BP.  BP turning 124-170/76-94  Some issue relaxing to go to sleep, once asleep sleeps well.   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  Asthma moderate persistent:  Stable no flares.  Does use  Albuterol daily  With hot showers or steam, brushing horses.  He is taking claritin daily and allergies are well controlled.   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  Working on decreasing chol in diet. Less fast food and cookies.  Social History /Family History/Past Medical History reviewed in detail and updated in EMR if needed. Blood pressure 140/90, pulse 86, temperature 98.3 F (36.8 C), temperature source Oral, height 5\' 9"  (1.753 m), weight 162 lb 8 oz (73.7 kg).   Review of Systems  Constitutional: Negative for fatigue and fever.  HENT: Negative for ear pain.   Eyes: Negative for pain.  Respiratory: Negative for cough and shortness of breath.   Cardiovascular: Negative for chest pain, palpitations and leg swelling.  Gastrointestinal: Negative for abdominal pain.   Genitourinary: Negative for dysuria.  Musculoskeletal: Negative for arthralgias.  Neurological: Negative for syncope, light-headedness and headaches.  Psychiatric/Behavioral: Positive for agitation. Negative for dysphoric mood.       Objective:   Physical Exam Constitutional:      General: He is not in acute distress.    Appearance: Normal appearance. He is well-developed. He is not ill-appearing or toxic-appearing.  HENT:     Head: Normocephalic and atraumatic.     Right Ear: Hearing, tympanic membrane, ear canal and external ear normal.     Left Ear: Hearing, tympanic membrane, ear canal and external ear normal.     Nose: Nose normal.     Mouth/Throat:     Pharynx: Uvula midline.  Eyes:     General: Lids are normal. Lids are everted, no foreign bodies appreciated.     Conjunctiva/sclera: Conjunctivae normal.     Pupils: Pupils are equal, round, and reactive to light.  Neck:     Musculoskeletal: Normal range of motion and neck supple.     Thyroid: No thyroid mass or thyromegaly.     Vascular: No carotid bruit.     Trachea: Trachea and phonation normal.  Cardiovascular:     Rate and Rhythm: Normal rate and regular rhythm.     Pulses: Normal pulses.     Heart sounds: S1 normal and S2 normal. No murmur. No gallop.   Pulmonary:     Breath sounds: Normal breath sounds. No wheezing, rhonchi or rales.  Abdominal:  General: Bowel sounds are normal.     Palpations: Abdomen is soft.     Tenderness: There is no abdominal tenderness. There is no guarding or rebound.     Hernia: No hernia is present.  Lymphadenopathy:     Cervical: No cervical adenopathy.  Skin:    General: Skin is warm and dry.     Findings: No rash.  Neurological:     Mental Status: He is alert.     Cranial Nerves: No cranial nerve deficit.     Sensory: No sensory deficit.     Gait: Gait normal.     Deep Tendon Reflexes: Reflexes are normal and symmetric.  Psychiatric:        Speech: Speech normal.         Behavior: Behavior normal.        Judgment: Judgment normal.           Assessment & Plan:  The patient's preventative maintenance and recommended screening tests for an annual wellness exam were reviewed in full today. Brought up to date unless services declined.  Counselled on the importance of diet, exercise, and its role in overall health and mortality. The patient's FH and SH was reviewed, including their home life, tobacco status, and drug and alcohol status.   Vaccines: updated Td. Prostate Cancer Screen:  Stable 0.77 Colon Cancer Screen:  DUE, will go forward with colonoscopy order.      Smoking Status: nonsmoker ETOH/ drug use: 1 on weekends/ none HIV screen:  refused

## 2018-08-31 NOTE — Assessment & Plan Note (Signed)
Well controlled. Continue current medication.  

## 2018-08-31 NOTE — Assessment & Plan Note (Signed)
Restart symbicort to decrease daily use of albuterol. Swish after use to prevent mouth irritation.

## 2018-08-31 NOTE — Patient Instructions (Addendum)
Restart Symbicort for asthma. Use albuterol for rescue only. We will call with referral for colonoscopy. Start sertraline 50 mg at bedtime. Limit taking blood pressure.

## 2018-09-02 ENCOUNTER — Other Ambulatory Visit: Payer: Self-pay | Admitting: Family Medicine

## 2018-09-07 ENCOUNTER — Telehealth: Payer: Self-pay

## 2018-09-07 NOTE — Telephone Encounter (Signed)
Blake Molina notified as instructed by telephone.  Patient states understanding.

## 2018-09-07 NOTE — Addendum Note (Signed)
Addended by: Carter Kitten on: 09/07/2018 04:24 PM   Modules accepted: Orders

## 2018-09-07 NOTE — Telephone Encounter (Signed)
Start OTC omeprazole 2 tabs of 20 mg daily for 2-4 weeks.. Call if not improving in 2 weeks.  Can use pepcid PRN.

## 2018-09-07 NOTE — Telephone Encounter (Signed)
Pt seen 08/31/18 for annual and pt said discussed with Dr Diona Browner about feeling of balloon in stomach. Pt said he has hx of nervous stomach; No N&V; some loose BM; pt said when has burping or passing gas stomach feels better. At times has heartburn; no CP like heart related; palpitations are better. Pt started sertraline 50 mg 1/4 tab on 09/01/18 and increased to sertraline 50 mg taking 1/2 tab on 09/05/18. On 09/06/18 started OTC pepcid but has not had time to help with stomach issue. Pt request cb. [facility].

## 2018-09-12 NOTE — Telephone Encounter (Signed)
Appointment 4/2

## 2018-09-12 NOTE — Telephone Encounter (Signed)
Call pt... given multiple issues that need more discussion ( zoloft, buspar start, stomach pain, anxiety).Marland KitchenMarland Kitchen I would recommend WEBEX this week and cancel 4 week follow up in April. If pt cannot do this let me know.

## 2018-09-13 NOTE — Telephone Encounter (Signed)
Pt called at 4:05 pm and said has virtual visit 09/14/18 at 8:30 but pt said what can he take for the nausea. Pt said has anxiety issues and thinks that might be related; no CP or SOB. If burps pt feels better. Pt did not want any recommendation of OTC med for nausea. Pt is taking omeprazole and pepcid now. Pt said this has been going on for couple of weeks and will wait for visit in the morning. ED precautions given. FYI to Dr Diona Browner.

## 2018-09-14 ENCOUNTER — Encounter: Payer: Self-pay | Admitting: Family Medicine

## 2018-09-14 ENCOUNTER — Ambulatory Visit (INDEPENDENT_AMBULATORY_CARE_PROVIDER_SITE_OTHER): Payer: BLUE CROSS/BLUE SHIELD | Admitting: Family Medicine

## 2018-09-14 ENCOUNTER — Encounter: Payer: Self-pay | Admitting: *Deleted

## 2018-09-14 ENCOUNTER — Other Ambulatory Visit: Payer: Self-pay

## 2018-09-14 VITALS — BP 147/93 | Ht 69.0 in

## 2018-09-14 DIAGNOSIS — F418 Other specified anxiety disorders: Secondary | ICD-10-CM

## 2018-09-14 DIAGNOSIS — M5440 Lumbago with sciatica, unspecified side: Secondary | ICD-10-CM

## 2018-09-14 DIAGNOSIS — G8929 Other chronic pain: Secondary | ICD-10-CM

## 2018-09-14 DIAGNOSIS — I10 Essential (primary) hypertension: Secondary | ICD-10-CM

## 2018-09-14 DIAGNOSIS — K219 Gastro-esophageal reflux disease without esophagitis: Secondary | ICD-10-CM | POA: Diagnosis not present

## 2018-09-14 MED ORDER — TRAMADOL HCL 50 MG PO TABS: 50.0000 mg | ORAL_TABLET | Freq: Two times a day (BID) | ORAL | 0 refills | Status: DC | PRN

## 2018-09-14 MED ORDER — PANTOPRAZOLE SODIUM 40 MG PO TBEC: 40.0000 mg | DELAYED_RELEASE_TABLET | Freq: Every day | ORAL | 3 refills | Status: DC

## 2018-09-14 MED ORDER — BUSPIRONE HCL 10 MG PO TABS: 10.0000 mg | ORAL_TABLET | Freq: Three times a day (TID) | ORAL | 1 refills | Status: DC

## 2018-09-14 NOTE — Progress Notes (Signed)
VIRTUAL VISIT Due to national recommendations of social distancing due to [place] 19, a virtual visit is felt to be most appropriate for this patient at this time.   I connected with Katheren Puller on 09/14/18 at  8:30 AM EDT by [facility] and verified that I am speaking with the correct person using two identifiers.   I discussed the limitations, risks, security and privacy concerns of performing an evaluation and management service by [facility] and the availability of in person appointments. I also discussed with the patient that there may be a patient responsible charge related to this service. The patient expressed understanding and agreed to proceed.  Patient location: Home Provider Location: [place] Participants: [name] and [name]   Chief Complaint  Patient presents with  . Follow-up    Anxiety  . Nausea  . Heartburn  . Blood In Stools    History of Present Illness:  53 year old male with situational anxiety seen on 3/19.   At that time he was started on sertraline 50 mg daily for situational anxiety related to recent stress and blood pressure issues. Since then he reports several issues:  1. Situational anxiety... minimally better mood in last 2 weeks on sertraline.  Still anxious, panicky.  Sleeping OK.  Cannot relax, worried about symptoms. Buspar helped in past.  2.Severe GERD.Marland Kitchen worst for 2 hours after waking in morning  Food feels stuck in midchest, nausea and heartburn.  Early satiety  No abdominal pain.  Feels bloated.  Gas, somethies relived with burping.   He is using meloxicam twice daily at lower dose for chronic  Back pain.. 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   Using omeprazole 40 mg,  As well as TUMs an Pepcid BID before meals.   3.Has also noted bright red blood in stool off and on , small amount  In the last week.  Occ straining with BMs, lots of gas. No pain with BM, no rectal itching  3. HTN 147/93 before he took the  [place] 19 screen No recent travel or known exposure to [place] The patient denies respiratory symptoms of COVID 19 at this time.  The importance of social distancing was discussed today.   Review of Systems  Constitutional: Negative for fever and malaise/fatigue.  Respiratory: Negative for cough and shortness of breath.   Cardiovascular: Negative for chest pain, palpitations and leg swelling.  Gastrointestinal: Positive for blood in stool, constipation, heartburn and nausea. Negative for abdominal pain.  Genitourinary: Negative for dysuria.  Musculoskeletal: Negative for myalgias.      Past Medical History:  Diagnosis Date  . Allergic rhinitis, cause unspecified   . BPH without obstruction/lower urinary tract symptoms   . Congenital hypertrophic pyloric stenosis   . Esophageal reflux   . Heart burn   . History of kidney stones   . Other chest pain   . Panic disorder without agoraphobia   . Unspecified asthma(493.90)     reports that he has never smoked. He has never used smokeless tobacco. He reports current alcohol use. He reports that he does not use drugs.   Current Outpatient Medications:  .  Acetaminophen (TYLENOL 8 HOUR PO), Take by mouth., Disp: , Rfl:  .  albuterol (VENTOLIN HFA) 108 (90 Base) MCG/ACT inhaler, Inhale 1-2 puffs into the lungs every 6 (six) hours as needed for wheezing or shortness of breath., Disp: 3 Inhaler, Rfl: 1 .  budesonide-formoterol (SYMBICORT) 80-4.5 MCG/ACT inhaler, Inhale 2 puffs  into the lungs 2 (two) times daily., Disp: 1 Inhaler, Rfl: 3 .  Famotidine (PEPCID AC MAXIMUM STRENGTH PO), , Disp: , Rfl:  .  loratadine (CLARITIN) 10 MG tablet, , Disp: , Rfl:  .  losartan (COZAAR) 50 MG tablet, Take 1 tablet (50 mg total) by mouth daily., Disp: 30 tablet, Rfl: 11 .  meloxicam (MOBIC) 7.5 MG tablet, Take 1 tablet (7.5 mg total) by mouth 2 (two) times daily., Disp: 180 tablet, Rfl: 0 .  omeprazole (PRILOSEC OTC) 20 MG tablet, Take 40 mg by mouth  daily., Disp: , Rfl:  .  sertraline (ZOLOFT) 50 MG tablet, Take 1 tablet (50 mg total) by mouth daily., Disp: 30 tablet, Rfl: 3   Observations/Objective: Blood pressure (!) 147/93, height 5\' 9"  (1.753 m).  Physical Exam Constitutional:      Appearance: Normal appearance.  Pulmonary:     Effort: Pulmonary effort is normal. No respiratory distress.  Neurological:     Mental Status: He is alert and oriented to person, place, and time.  Psychiatric:        Mood and Affect: Mood normal.        Thought Content: Thought content normal.        Judgment: Judgment normal.      Assessment and Plan See problem based charting   I discussed the assessment and treatment plan with the patient. The patient was provided an opportunity to ask questions and all were answered. The patient agreed with the plan and demonstrated an understanding of the instructions.   The patient was advised to call back or seek an in-person evaluation if the symptoms worsen or if the condition fails to improve as anticipated.     [name], MD

## 2018-09-14 NOTE — Progress Notes (Signed)
AVS sent to patient via MyChart.

## 2018-09-14 NOTE — Patient Instructions (Addendum)
I was nice talking to you today! Stop sertraline, meloxicam, and omeprazole. Avoid reflux triggers as discussed. Start pantoprazole for reflux daily in addition to pepcid twice daily. Use tramadol as needed for back pain twice daily. I have sent in 5 days of this prescription.Marland Kitchen if it helps with pain I can send in a  Longer prescription. Start Buspar for anxiety three times daily... we can increase this at the next OV as needed. Shirlean Mylar will call you to set up 1 week WEBEX follow up.

## 2018-09-14 NOTE — Assessment & Plan Note (Signed)
Try to use tramadol as need for low back pain instead of NSAID.

## 2018-09-14 NOTE — Assessment & Plan Note (Signed)
IMproved control but not yet at goal likely due to  minimal improvement in mood. Continue current medication for BP.

## 2018-09-14 NOTE — Assessment & Plan Note (Signed)
Possibly NSAID related. Stop meloxicam. Pt feels worsened with SSRI.  Stop omeprazole.. change to pantoprazole.  Continue BID pepcid.

## 2018-09-14 NOTE — Assessment & Plan Note (Signed)
Stop sertraline as SE.  Start buspar titrate up as needed.   Plan starting lexapro or cymbalta at next OV for better long term control of mood.

## 2018-09-19 ENCOUNTER — Encounter: Payer: Self-pay | Admitting: Family Medicine

## 2018-09-19 ENCOUNTER — Ambulatory Visit (INDEPENDENT_AMBULATORY_CARE_PROVIDER_SITE_OTHER): Payer: BLUE CROSS/BLUE SHIELD | Admitting: Family Medicine

## 2018-09-19 ENCOUNTER — Other Ambulatory Visit: Payer: Self-pay

## 2018-09-19 VITALS — Ht 69.0 in

## 2018-09-19 DIAGNOSIS — K219 Gastro-esophageal reflux disease without esophagitis: Secondary | ICD-10-CM | POA: Diagnosis not present

## 2018-09-19 DIAGNOSIS — G8929 Other chronic pain: Secondary | ICD-10-CM

## 2018-09-19 DIAGNOSIS — I10 Essential (primary) hypertension: Secondary | ICD-10-CM | POA: Diagnosis not present

## 2018-09-19 DIAGNOSIS — F418 Other specified anxiety disorders: Secondary | ICD-10-CM

## 2018-09-19 DIAGNOSIS — M5440 Lumbago with sciatica, unspecified side: Secondary | ICD-10-CM

## 2018-09-19 NOTE — Assessment & Plan Note (Signed)
Tolerable contol on tramadol and off meloxicam. Use tylenol prn as well.

## 2018-09-19 NOTE — Assessment & Plan Note (Signed)
Significant improvement off Sertraline.  On pantoprazole and trigger avoidance.

## 2018-09-19 NOTE — Assessment & Plan Note (Signed)
Well controlled. Continue current medication. Pt will follow weekly and send Mychart message with BPs until follow up appt.

## 2018-09-19 NOTE — Assessment & Plan Note (Signed)
Improved on buspar TID. Continue at current dose.

## 2018-09-19 NOTE — Patient Instructions (Signed)
Continue Buspar at current dose. Continue patoprazole for reflux.  Use tramadol for pain in low back. Can use tylenol as well as needed. Follow blood pressure once weekly and MyChart the measurements.  Call if diarrhea and blood in stool not resolving for potential referral for DIAGNOSTIC colonoscopy.. sooner than planned screening colonoscopy.

## 2018-09-19 NOTE — Progress Notes (Signed)
VIRTUAL VISIT Due to national recommendations of social distancing due to [place] 19, a virtual visit is felt to be most appropriate for this patient at this time.   I connected with@ on 09/19/18 at  8:30 AM EDT by [facility] and verified that I am speaking with the correct person using two identifiers.   I discussed the limitations, risks, security and privacy concerns of performing an evaluation and management service by [facility] and the availability of in person appointments. I also discussed with the patient that there may be a patient responsible charge related to this service. The patient expressed understanding and agreed to proceed.  Patient location: Home Provider Location: [facility] [place] Participants: [name] and [name]   Chief Complaint  Patient presents with  . Follow-up    Mood, Jerrye Bushy, HTN    History of Present Illness: 53 year old male presents for follow up  mulitple issues.  1. Situational Anxiety:  At last OV stopped sertraline due to SE.  Started buspar low dose TID.  He has noted less nervousness, able to relax. Able to sleep through the night.  He is using the medication three times a day.  he denies depression.  No SI.  2. GERD: Stopped NSAIDs and changed omeprazole to pantoprazole.  He reports once he stopped sertraline abdominal symptoms much better. 80 % better.  Still has some diarrhea... getting better, occ off and on brbpr very small amount on toilet tissue. ( has  Colonoscopy pending once pandemic situation changes)  Bloating all gone. No abd pain.  3.Hypertension: He has helped off checking BP given it makes him axious.  Using medication without problems or lightheadedness:  Chest pain with exertion: none Edema:none Short of breath:none Average home BPs: Other issues: BP Readings from Last 3 Encounters:  09/14/18 (!) 147/93  08/31/18 140/90  08/15/18 104/68    4. Chronic back pain: Stopped NSAIDs due to GERD issues... using tramadol  prn. He reports back pain is controlled on tramadol  BID.  COVID 19 screen No recent travel or known exposure to COVID19 The patient denies respiratory symptoms of COVID 19 at this time.  The importance of social distancing was discussed today.   Review of Systems  Constitutional: Negative for fever and malaise/fatigue.  Respiratory: Negative for cough and shortness of breath.   Cardiovascular: Negative for chest pain, palpitations and leg swelling.  Gastrointestinal: Positive for blood in stool and diarrhea. Negative for abdominal pain, constipation, heartburn and nausea.  Genitourinary: Negative for dysuria.  Musculoskeletal: Negative for myalgias.       Past Medical History:  Diagnosis Date  . Allergic rhinitis, cause unspecified   . BPH without obstruction/lower urinary tract symptoms   . Congenital hypertrophic pyloric stenosis   . Esophageal reflux   . Heart burn   . History of kidney stones   . Other chest pain   . Panic disorder without agoraphobia   . Unspecified asthma(493.90)     reports that he has never smoked. He has never used smokeless tobacco. He reports current alcohol use. He reports that he does not use drugs.   Current Outpatient Medications:  .  Acetaminophen (TYLENOL 8 HOUR PO), Take by mouth., Disp: , Rfl:  .  albuterol (VENTOLIN HFA) 108 (90 Base) MCG/ACT inhaler, Inhale 1-2 puffs into the lungs every 6 (six) hours as needed for wheezing or shortness of breath., Disp: 3 Inhaler, Rfl: 1 .  budesonide-formoterol (SYMBICORT) 80-4.5 MCG/ACT inhaler, Inhale 2 puffs into the  lungs 2 (two) times daily., Disp: 1 Inhaler, Rfl: 3 .  busPIRone (BUSPAR) 10 MG tablet, Take 1 tablet (10 mg total) by mouth 3 (three) times daily., Disp: 90 tablet, Rfl: 1 .  Famotidine (PEPCID AC MAXIMUM STRENGTH PO), , Disp: , Rfl:  .  loratadine (CLARITIN) 10 MG tablet, , Disp: , Rfl:  .  losartan (COZAAR) 50 MG tablet, Take 1 tablet (50 mg total) by mouth daily., Disp: 30 tablet, Rfl:  11 .  meloxicam (MOBIC) 7.5 MG tablet, Take 1 tablet (7.5 mg total) by mouth 2 (two) times daily., Disp: 180 tablet, Rfl: 0 .  omeprazole (PRILOSEC OTC) 20 MG tablet, Take 40 mg by mouth daily., Disp: , Rfl:  .  pantoprazole (PROTONIX) 40 MG tablet, Take 1 tablet (40 mg total) by mouth daily., Disp: 30 tablet, Rfl: 3 .  sertraline (ZOLOFT) 50 MG tablet, Take 1 tablet (50 mg total) by mouth daily., Disp: 30 tablet, Rfl: 3 .  traMADol (ULTRAM) 50 MG tablet, Take 1 tablet (50 mg total) by mouth every 12 (twelve) hours as needed., Disp: 10 tablet, Rfl: 0   Observations/Objective: Height 5\' 9"  (1.753 m).  Physical Exam  Physical Exam Constitutional:      General: She is not in acute distress. Pulmonary:     Effort: Pulmonary effort is normal. No respiratory distress.  Neurological:     Mental Status: She is alert and oriented to person, place, and time.  Psychiatric:        Mood and Affect: Mood normal.        Behavior: Behavior normal.    Assessment and Plan   Situational anxiety Improved on buspar TID. Continue at current dose.  Hypertension, essential, benign Well controlled. Continue current medication. Pt will follow weekly and send Mychart message with BPs until follow up appt.  Chronic low back pain Tolerable contol on tramadol and off meloxicam. Use tylenol prn as well.  G E R D Significant improvement off Sertraline.  On pantoprazole and trigger avoidance.   I discussed the assessment and treatment plan with the patient. The patient was provided an opportunity to ask questions and all were answered. The patient agreed with the plan and demonstrated an understanding of the instructions.   The patient was advised to call back or seek an in-person evaluation if the symptoms worsen or if the condition fails to improve as anticipated.   Follow up in 2 months on multiple issues.Marland Kitchen in office if able to check BP as well.    [name], MD

## 2018-09-20 ENCOUNTER — Other Ambulatory Visit: Payer: Self-pay

## 2018-09-20 ENCOUNTER — Telehealth: Payer: Self-pay | Admitting: Gastroenterology

## 2018-09-20 DIAGNOSIS — Z1211 Encounter for screening for malignant neoplasm of colon: Secondary | ICD-10-CM

## 2018-09-20 MED ORDER — TRAMADOL HCL 50 MG PO TABS: 50.0000 mg | ORAL_TABLET | Freq: Two times a day (BID) | ORAL | 0 refills | Status: DC | PRN

## 2018-09-20 NOTE — Telephone Encounter (Signed)
Last office visit 09/19/2018 for Anxiety/Gerd/HTN.  Last refilled 09/14/2018 for #10 with no refills. No future appointments.

## 2018-09-20 NOTE — Telephone Encounter (Signed)
Gastroenterology Pre-Procedure Review  Request Date: Raynelle Dick 12/21/2018    [place] Requesting Physician: Dr. Allen Norris  PATIENT REVIEW QUESTIONS: The patient responded to the following health history questions as indicated:    1. Are you having any GI issues? no 2. Do you have a personal history of Polyps? no 3. Do you have a family history of Colon Cancer or Polyps? no 4. Diabetes Mellitus? no 5. Joint replacements in the past 12 months?no 6. Major health problems in the past 3 months?no 7. Any artificial heart valves, MVP, or defibrillator?no    MEDICATIONS & ALLERGIES:    Patient reports the following regarding taking any anticoagulation/antiplatelet therapy:   Plavix, Coumadin, Eliquis, Xarelto, Lovenox, Pradaxa, Brilinta, or Effient? no Aspirin? no  Patient confirms/reports the following medications:  Current Outpatient Medications  Medication Sig Dispense Refill  . Acetaminophen (TYLENOL 8 HOUR PO) Take by mouth.    Marland Kitchen albuterol (VENTOLIN HFA) 108 (90 Base) MCG/ACT inhaler Inhale 1-2 puffs into the lungs every 6 (six) hours as needed for wheezing or shortness of breath. 3 Inhaler 1  . budesonide-formoterol (SYMBICORT) 80-4.5 MCG/ACT inhaler Inhale 2 puffs into the lungs 2 (two) times daily. 1 Inhaler 3  . busPIRone (BUSPAR) 10 MG tablet Take 1 tablet (10 mg total) by mouth 3 (three) times daily. 90 tablet 1  . Famotidine (PEPCID AC MAXIMUM STRENGTH PO)     . loratadine (CLARITIN) 10 MG tablet     . losartan (COZAAR) 50 MG tablet Take 1 tablet (50 mg total) by mouth daily. 30 tablet 11  . meloxicam (MOBIC) 7.5 MG tablet Take 1 tablet (7.5 mg total) by mouth 2 (two) times daily. 180 tablet 0  . omeprazole (PRILOSEC OTC) 20 MG tablet Take 40 mg by mouth daily.    . pantoprazole (PROTONIX) 40 MG tablet Take 1 tablet (40 mg total) by mouth daily. 30 tablet 3  . sertraline (ZOLOFT) 50 MG tablet Take 1 tablet (50 mg total) by mouth daily. 30 tablet 3  . traMADol (ULTRAM) 50 MG tablet Take 1  tablet (50 mg total) by mouth every 12 (twelve) hours as needed. 10 tablet 0   No current facility-administered medications for this visit.     Patient confirms/reports the following allergies:  No Known Allergies  No orders of the defined types were placed in this encounter.   AUTHORIZATION INFORMATION Primary Insurance: 1D#: Group #:  Secondary Insurance: 1D#: Group #:  SCHEDULE INFORMATION: Date:  Time: Location:

## 2018-09-20 NOTE — Telephone Encounter (Signed)
Pt called checking on refill of tramdol

## 2018-09-22 ENCOUNTER — Other Ambulatory Visit: Payer: Self-pay | Admitting: Family Medicine

## 2018-09-28 ENCOUNTER — Ambulatory Visit: Payer: BLUE CROSS/BLUE SHIELD | Admitting: Family Medicine

## 2018-10-06 ENCOUNTER — Other Ambulatory Visit: Payer: Self-pay | Admitting: Family Medicine

## 2018-10-19 ENCOUNTER — Other Ambulatory Visit: Payer: Self-pay | Admitting: Family Medicine

## 2018-10-19 NOTE — Telephone Encounter (Signed)
Last visit 09/19/2018 for follow up.  Last refilled 09/20/2018 for #60 with no refills.  Next Appt: 11/23/2018 for 2 month follow up.

## 2018-11-09 ENCOUNTER — Other Ambulatory Visit: Payer: Self-pay

## 2018-11-09 ENCOUNTER — Other Ambulatory Visit: Payer: Self-pay | Admitting: *Deleted

## 2018-11-09 ENCOUNTER — Other Ambulatory Visit: Payer: BLUE CROSS/BLUE SHIELD

## 2018-11-09 DIAGNOSIS — N138 Other obstructive and reflux uropathy: Secondary | ICD-10-CM

## 2018-11-09 DIAGNOSIS — N401 Enlarged prostate with lower urinary tract symptoms: Secondary | ICD-10-CM

## 2018-11-10 LAB — PSA: Prostate Specific Ag, Serum: 0.8 ng/mL (ref 0.0–4.0)

## 2018-11-14 NOTE — Progress Notes (Addendum)
.    8:40 AM   Blake Molina 1965/09/29 [id number]  Referring provider: Jinny Sanders, MD [address] [place], [place] [address]  Chief Complaint  Patient presents with  . Benign Prostatic Hypertrophy    HPI: Patient is a 53 year old Caucasian male who presents today for a one year follow up for a history of nephrolithiasis and BPH with LUTS.    History of nephrolithiasis Paternal grandfather with stones.  Patient had spontaneously passed a left ureteral stone in 07/2015.  Renal ultrasound performed on 09/02/2015 noted no hydronephrosis.  Stone composition was calcium oxalate monohydrate composition of 98% and a calcium phosphate carbonate of 2%.  KUB taken on 11/08/2017 noted pelvic calcifications noted most consistent phleboliths. No change from 09/06/2016 no other significant abnormality identified.  No passage of fragments or flank pain reported.    BPH WITH LUTS His I PSS is 5/2.  His previous IPSS score was 0/0.   His major complaint today is nocturia.  Patient denies any gross hematuria, dysuria or suprapubic/flank pain.  Patient denies any fevers, chills, nausea or vomiting.  No family history of prostate cancer.    IPSS    Row Name 11/15/18 0800         International Prostate Symptom Score   How often have you had the sensation of not emptying your bladder?  Not at All     How often have you had to urinate less than every two hours?  Not at All     How often have you found you stopped and started again several times when you urinated?  Not at All     How often have you found it difficult to postpone urination?  Not at All     How often have you had a weak urinary stream?  Not at All     How often have you had to strain to start urination?  Not at All     How many times did you typically get up at night to urinate?  5 Times     Total IPSS Score  5       Quality of Life due to urinary symptoms   If you were to spend the rest of your life with your urinary  condition just the way it is now how would you feel about that?  Mostly Satisfied      PMH: Past Medical History:  Diagnosis Date  . Allergic rhinitis, cause unspecified   . BPH without obstruction/lower urinary tract symptoms   . Congenital hypertrophic pyloric stenosis   . Esophageal reflux   . Heart burn   . History of kidney stones   . Other chest pain   . Panic disorder without agoraphobia   . Unspecified asthma(493.90)     Surgical History: Past Surgical History:  Procedure Laterality Date  . herniated disc-back surgery  1996  . Stenosis surgery (other)  1960s   hx of pyloric stenosis as infant    Home Medications:  Allergies as of 11/15/2018   No Known Allergies     Medication List       Accurate as of November 15, 2018  8:40 AM. If you have any questions, ask your nurse or doctor.        STOP taking these medications   sertraline 50 MG tablet Commonly known as:  ZOLOFT Stopped by:  Jovonda Selner, PA-C     TAKE these medications   albuterol 108 (90 Base) MCG/ACT inhaler  Commonly known as:  Ventolin HFA Inhale 1-2 puffs into the lungs every 6 (six) hours as needed for wheezing or shortness of breath.   budesonide-formoterol 80-4.5 MCG/ACT inhaler Commonly known as:  SYMBICORT Inhale 2 puffs into the lungs 2 (two) times daily.   busPIRone 10 MG tablet Commonly known as:  BUSPAR TAKE 1 TABLET BY MOUTH THREE TIMES A DAY   loratadine 10 MG tablet Commonly known as:  CLARITIN   losartan 50 MG tablet Commonly known as:  COZAAR Take 1 tablet (50 mg total) by mouth daily.   meloxicam 7.5 MG tablet Commonly known as:  MOBIC Take 1 tablet (7.5 mg total) by mouth 2 (two) times daily.   omeprazole 20 MG tablet Commonly known as:  PRILOSEC OTC Take 40 mg by mouth daily.   pantoprazole 40 MG tablet Commonly known as:  PROTONIX Take 1 tablet (40 mg total) by mouth daily.   PEPCID AC MAXIMUM STRENGTH PO   traMADol 50 MG tablet Commonly known as:  ULTRAM  TAKE 1 TABLET (50 MG TOTAL) BY MOUTH EVERY 12 (TWELVE) HOURS AS NEEDED.   TYLENOL 8 HOUR PO Take by mouth.       Allergies: No Known Allergies  Family History: Family History  Problem Relation Age of Onset  . Coronary artery disease Father        stent age 15  . Hypertension Father   . Allergies Mother   . Asthma Brother   . Allergies Brother   . Hypertension Paternal Grandfather   . Kidney disease Neg Hx   . Prostate cancer Neg Hx   . Kidney cancer Neg Hx   . Bladder Cancer Neg Hx     Social History:  reports that he has never smoked. He has never used smokeless tobacco. He reports current alcohol use. He reports that he does not use drugs.  ROS: UROLOGY Frequent Urination?: No Hard to postpone urination?: No Burning/pain with urination?: No Get up at night to urinate?: Yes Leakage of urine?: No Urine stream starts and stops?: No Trouble starting stream?: No Do you have to strain to urinate?: No Blood in urine?: No Urinary tract infection?: No Sexually transmitted disease?: No Injury to kidneys or bladder?: No Painful intercourse?: No Weak stream?: No Erection problems?: No Penile pain?: No  Gastrointestinal Nausea?: No Vomiting?: No Indigestion/heartburn?: Yes Diarrhea?: No Constipation?: No  Constitutional Fever: No Night sweats?: No Weight loss?: No Fatigue?: No  Skin Skin rash/lesions?: No Itching?: No  Eyes Blurred vision?: No Double vision?: No  Ears/Nose/Throat Sore throat?: No Sinus problems?: No  Hematologic/Lymphatic Swollen glands?: No Easy bruising?: No  Cardiovascular Leg swelling?: No Chest pain?: No  Respiratory Cough?: No Shortness of breath?: No  Endocrine Excessive thirst?: No  Musculoskeletal Back pain?: Yes Joint pain?: No  Neurological Headaches?: No Dizziness?: No  Psychologic Depression?: No Anxiety?: Yes  Physical Exam: BP 139/81 (BP Location: Left Arm, Patient Position: Sitting, Cuff Size:  Normal)   Pulse 85   Ht 5\' 9"  (1.753 m)   Wt 155 lb 14.4 oz (70.7 kg)   BMI 23.02 kg/m   Constitutional:  Well nourished. Alert and oriented, No acute distress. HEENT: [place] AT, moist mucus membranes.  Trachea midline, no masses. Cardiovascular: No clubbing, cyanosis, or edema. Respiratory: Normal respiratory effort, no increased work of breathing. GI: Abdomen is soft, non tender, non distended, no abdominal masses. Liver and spleen not palpable.  No hernias appreciated.  Stool sample for occult testing is not indicated.  GU: No CVA tenderness.  No bladder fullness or masses.  Patient with circumcised phallus.  Urethral meatus is patent.  No penile discharge. No penile lesions or rashes. Scrotum without lesions, cysts, rashes and/or edema.  Testicles are located scrotally bilaterally. No masses are appreciated in the testicles. Left and right epididymis are normal. Rectal: Patient with  normal sphincter tone. Anus and perineum without scarring or rashes. No rectal masses are appreciated. Prostate is approximately 55 grams, no nodules are appreciated. Seminal vesicles are normal. Skin: No rashes, bruises or suspicious lesions. Lymph: No inguinal adenopathy. Neurologic: Grossly intact, no focal deficits, moving all 4 extremities. Psychiatric: Normal mood and affect.   Laboratory Data: PSA History  0.8 ng/mL on 09/11/2015  0.9 ng/mL on 09/06/2016  0.8 ng/mL on 11/02/2017  0.8 ng/mL on 11/09/2018    Lab Results  Component Value Date   WBC 6.0 08/15/2018   HGB 15.6 08/15/2018   HCT 46.8 08/15/2018   MCV 86.8 08/15/2018   PLT 307.0 08/15/2018    Lab Results  Component Value Date   CREATININE 0.97 08/15/2018    Lab Results  Component Value Date   TSH 0.82 08/15/2018       Component Value Date/Time   CHOL 173 08/15/2018 0952   HDL 49.90 08/15/2018 0952   CHOLHDL 3 08/15/2018 0952   VLDL 10.2 08/15/2018 0952   LDLCALC 113 (H) 08/15/2018 0952    Lab Results  Component Value  Date   AST 21 08/15/2018   Lab Results  Component Value Date   ALT 18 08/15/2018   I have reviewed the labs  Pertinent Imaging: N/A  Assessment & Plan:    1. History of nephrolithiasis No reports of flank pain or passage of fragments  2. BPH with LU TS I PSS score is 5/2, it is worsened  - Continue conservative management, avoiding bladder irritants and timed voiding's  - most bothersome symptoms is/are nocturia  - RTC in 12 months for IPSS, PSA and exam   Return in about 1 year (around 11/15/2019) for IPSS, PSA and exam.  These notes generated with voice recognition software. I apologize for typographical errors.  Zara Council, PA-C  [facility] [address] [place], [place] [address] [phone]

## 2018-11-15 ENCOUNTER — Other Ambulatory Visit: Payer: Self-pay

## 2018-11-15 ENCOUNTER — Ambulatory Visit: Payer: BLUE CROSS/BLUE SHIELD | Admitting: Urology

## 2018-11-15 ENCOUNTER — Encounter: Payer: Self-pay | Admitting: Urology

## 2018-11-15 ENCOUNTER — Ambulatory Visit (INDEPENDENT_AMBULATORY_CARE_PROVIDER_SITE_OTHER): Payer: BC Managed Care – PPO | Admitting: Urology

## 2018-11-15 VITALS — BP 139/81 | HR 85 | Ht 69.0 in | Wt 155.9 lb

## 2018-11-15 DIAGNOSIS — Z87442 Personal history of urinary calculi: Secondary | ICD-10-CM | POA: Diagnosis not present

## 2018-11-15 DIAGNOSIS — N401 Enlarged prostate with lower urinary tract symptoms: Secondary | ICD-10-CM | POA: Diagnosis not present

## 2018-11-15 DIAGNOSIS — N138 Other obstructive and reflux uropathy: Secondary | ICD-10-CM | POA: Diagnosis not present

## 2018-11-16 ENCOUNTER — Other Ambulatory Visit: Payer: Self-pay | Admitting: Family Medicine

## 2018-11-16 NOTE — Telephone Encounter (Signed)
Refill request for Meloxicam. Last filled on 08/23/2018 for #180 with 0 refills. LOV 09/19/2018 for follow up. Future appointment 11/23/2018 for follow up.

## 2018-11-19 ENCOUNTER — Other Ambulatory Visit: Payer: Self-pay | Admitting: Family Medicine

## 2018-11-20 NOTE — Telephone Encounter (Signed)
Last office visit 09/19/2018 for Mood, Jerrye Bushy, & HTN.  Last refilled 10/19/2018 for #60 with no refills.  Next Appt 11/23/2018 for 2 month follow up.

## 2018-11-23 ENCOUNTER — Encounter: Payer: Self-pay | Admitting: Family Medicine

## 2018-11-23 ENCOUNTER — Other Ambulatory Visit: Payer: Self-pay

## 2018-11-23 ENCOUNTER — Ambulatory Visit: Payer: BC Managed Care – PPO | Admitting: Family Medicine

## 2018-11-23 ENCOUNTER — Other Ambulatory Visit: Payer: Self-pay | Admitting: Family Medicine

## 2018-11-23 DIAGNOSIS — I10 Essential (primary) hypertension: Secondary | ICD-10-CM

## 2018-11-23 DIAGNOSIS — K219 Gastro-esophageal reflux disease without esophagitis: Secondary | ICD-10-CM

## 2018-11-23 DIAGNOSIS — J301 Allergic rhinitis due to pollen: Secondary | ICD-10-CM | POA: Diagnosis not present

## 2018-11-23 DIAGNOSIS — F418 Other specified anxiety disorders: Secondary | ICD-10-CM | POA: Diagnosis not present

## 2018-11-23 NOTE — Patient Instructions (Signed)
Avoid reflux triggers and can try to wean off pantoprazole over time.  Continue buspar.  Can try zyrtec or Xyzal at bedtime for post nasal drip.  Change flonase to late day 2 sprays consistently.

## 2018-11-23 NOTE — Assessment & Plan Note (Signed)
Change flonase to night and trial of change from claritin to zyrtec/xyzal.

## 2018-11-23 NOTE — Progress Notes (Signed)
Chief Complaint  Patient presents with  . Follow-up    HTN, GERD, MOOD    History of Present Illness: HPI  53 year  Old male presents for follow up  1.GAD: Significant improvement now on  10 mg buspar.  Work on stress reduction. GAD7 5/21  2. GERD:  Improved on pantoprazole. Using pepcid AC every few weeks.  He is able to not restrict his diet.Marland Kitchen eating well.  No stool issues.  3.Hypertension:  Good control on losartan BP Readings from Last 3 Encounters:  11/23/18 138/74  11/15/18 139/81  09/14/18 (!) 147/93  Using medication without problems or lightheadedness: none Chest pain with exertion:none Edema:none Short of breath:none Average home BPs: Other issues:     COVID 19 screen No recent travel or known exposure to [place] The patient denies respiratory symptoms of COVID 19 at this time.  The importance of social distancing was discussed today.   Review of Systems  Constitutional: Negative for chills and fever.  HENT: Negative for congestion and ear pain.   Eyes: Negative for pain and redness.  Respiratory: Negative for cough and shortness of breath.   Cardiovascular: Negative for chest pain, palpitations and leg swelling.  Gastrointestinal: Negative for abdominal pain, blood in stool, constipation, diarrhea, nausea and vomiting.  Genitourinary: Negative for dysuria.  Musculoskeletal: Negative for falls and myalgias.  Skin: Negative for rash.  Neurological: Negative for dizziness.  Psychiatric/Behavioral: Negative for depression. The patient is not nervous/anxious.       Past Medical History:  Diagnosis Date  . Allergic rhinitis, cause unspecified   . BPH without obstruction/lower urinary tract symptoms   . Congenital hypertrophic pyloric stenosis   . Esophageal reflux   . Heart burn   . History of kidney stones   . Other chest pain   . Panic disorder without agoraphobia   . Unspecified asthma(493.90)     reports that he has never smoked. He has never  used smokeless tobacco. He reports current alcohol use. He reports that he does not use drugs.   Current Outpatient Medications:  .  Acetaminophen (TYLENOL 8 HOUR PO), Take by mouth., Disp: , Rfl:  .  albuterol (VENTOLIN HFA) 108 (90 Base) MCG/ACT inhaler, Inhale 1-2 puffs into the lungs every 6 (six) hours as needed for wheezing or shortness of breath., Disp: 3 Inhaler, Rfl: 1 .  budesonide-formoterol (SYMBICORT) 80-4.5 MCG/ACT inhaler, Inhale 2 puffs into the lungs 2 (two) times daily., Disp: 1 Inhaler, Rfl: 3 .  busPIRone (BUSPAR) 10 MG tablet, TAKE 1 TABLET BY MOUTH THREE TIMES A DAY, Disp: 270 tablet, Rfl: 0 .  Famotidine (PEPCID AC MAXIMUM STRENGTH PO), , Disp: , Rfl:  .  loratadine (CLARITIN) 10 MG tablet, , Disp: , Rfl:  .  losartan (COZAAR) 50 MG tablet, Take 1 tablet (50 mg total) by mouth daily., Disp: 30 tablet, Rfl: 11 .  omeprazole (PRILOSEC OTC) 20 MG tablet, Take 40 mg by mouth daily., Disp: , Rfl:  .  pantoprazole (PROTONIX) 40 MG tablet, Take 1 tablet (40 mg total) by mouth daily., Disp: 30 tablet, Rfl: 3 .  traMADol (ULTRAM) 50 MG tablet, TAKE 1 TABLET (50 MG TOTAL) BY MOUTH EVERY 12 (TWELVE) HOURS AS NEEDED., Disp: 60 tablet, Rfl: 0   Observations/Objective: Blood pressure 138/74, pulse 60, temperature 98.1 F (36.7 C), temperature source Other (Comment), height 5\' 9"  (1.753 m), weight 155 lb 8 oz (70.5 kg).  Physical Exam Constitutional:      Appearance: He is well-developed.  HENT:     Head: Normocephalic.     Right Ear: Hearing normal.     Left Ear: Hearing normal.     Nose: Nose normal.  Neck:     Thyroid: No thyroid mass or thyromegaly.     Vascular: No carotid bruit.     Trachea: Trachea normal.  Cardiovascular:     Rate and Rhythm: Normal rate and regular rhythm.     Pulses: Normal pulses.     Heart sounds: Heart sounds not distant. No murmur. No friction rub. No gallop.      Comments: No peripheral edema Pulmonary:     Effort: Pulmonary effort is  normal. No respiratory distress.     Breath sounds: Normal breath sounds.  Skin:    General: Skin is warm and dry.     Findings: No rash.  Psychiatric:        Speech: Speech normal.        Behavior: Behavior normal.        Thought Content: Thought content normal.      Assessment and Plan Allergic rhinitis  Change flonase to night and trial of change from claritin to zyrtec/xyzal.  Situational anxiety IMproved with buspar and improvement in HTN>  Hypertension, essential, benign Well controlled. Continue current medication.   G E R D Improved control on pantoprazole.  Avoid GER triggers and wean off PPI over time as able.       [name], MD

## 2018-11-23 NOTE — Assessment & Plan Note (Signed)
Improved control on pantoprazole.  Avoid GER triggers and wean off PPI over time as able.

## 2018-11-23 NOTE — Assessment & Plan Note (Signed)
IMproved with buspar and improvement in HTN>

## 2018-11-23 NOTE — Assessment & Plan Note (Signed)
Well controlled. Continue current medication.  

## 2018-12-06 ENCOUNTER — Other Ambulatory Visit: Payer: Self-pay | Admitting: Family Medicine

## 2018-12-18 ENCOUNTER — Other Ambulatory Visit
Admission: RE | Admit: 2018-12-18 | Discharge: 2018-12-18 | Disposition: A | Discharge status: Home / Self Care | Payer: BC Managed Care – PPO | Source: Ambulatory Visit | Attending: Gastroenterology | Admitting: Gastroenterology

## 2018-12-18 ENCOUNTER — Other Ambulatory Visit: Payer: Self-pay | Admitting: Family Medicine

## 2018-12-18 NOTE — Telephone Encounter (Signed)
Last office visit 11/23/2018 for seasonal allergic rhinitis.  Last refilled 11/21/2018 for #60 with no refills.  CPE scheduled for 09/07/2019.

## 2019-01-01 ENCOUNTER — Other Ambulatory Visit: Admission: RE | Admit: 2019-01-01 | Payer: BC Managed Care – PPO | Source: Ambulatory Visit

## 2019-01-05 ENCOUNTER — Other Ambulatory Visit: Payer: Self-pay | Admitting: Family Medicine

## 2019-01-05 NOTE — Telephone Encounter (Signed)
Last fill 10/06/18  #270/0 Last OV 11/23/18 Next OV 08/31/19

## 2019-01-12 DIAGNOSIS — S61501A Unspecified open wound of right wrist, initial encounter: Secondary | ICD-10-CM | POA: Diagnosis not present

## 2019-01-15 ENCOUNTER — Other Ambulatory Visit: Payer: Self-pay

## 2019-01-15 ENCOUNTER — Encounter: Payer: Self-pay | Admitting: *Deleted

## 2019-01-15 MED ORDER — NA SULFATE-K SULFATE-MG SULF 17.5-3.13-1.6 GM/177ML PO SOLN: 1.0000 | Freq: Once | ORAL | 0 refills | Status: AC

## 2019-01-15 NOTE — Anesthesia Preprocedure Evaluation (Addendum)
Anesthesia Evaluation  Patient identified by MRN, date of birth, ID band  Reviewed: Allergy & Precautions  History of Anesthesia Complications Negative for: history of anesthetic complications  Airway Mallampati: I  TM Distance: >3 FB Neck ROM: Full    Dental no notable dental hx.    Pulmonary asthma ,    Pulmonary exam normal        Cardiovascular Exercise Tolerance: Good hypertension, Pt. on medications Normal cardiovascular exam     Neuro/Psych    GI/Hepatic Neg liver ROS, GERD  ,  Endo/Other  negative endocrine ROS  Renal/GU negative Renal ROS     Musculoskeletal   Abdominal   Peds  Hematology   Anesthesia Other Findings   Reproductive/Obstetrics                           Anesthesia Physical Anesthesia Plan  ASA: II  Anesthesia Plan: General   Post-op Pain Management:    Induction: Intravenous  PONV Risk Score and Plan: 1 and Propofol infusion  Airway Management Planned: Natural Airway and Nasal Cannula  Additional Equipment: None  Intra-op Plan:   Post-operative Plan:   Informed Consent: I have reviewed the patients History and Physical, chart, labs and discussed the procedure including the risks, benefits and alternatives for the proposed anesthesia with the patient or authorized representative who has indicated his/her understanding and acceptance.       Plan Discussed with:   Anesthesia Plan Comments:         Anesthesia Quick Evaluation

## 2019-01-18 ENCOUNTER — Other Ambulatory Visit
Admission: RE | Admit: 2019-01-18 | Discharge: 2019-01-18 | Disposition: A | Discharge status: Home / Self Care | Payer: BC Managed Care – PPO | Source: Ambulatory Visit | Attending: Gastroenterology | Admitting: Gastroenterology

## 2019-01-18 ENCOUNTER — Other Ambulatory Visit: Payer: Self-pay

## 2019-01-18 DIAGNOSIS — Z01812 Encounter for preprocedural laboratory examination: Secondary | ICD-10-CM | POA: Insufficient documentation

## 2019-01-18 DIAGNOSIS — Z20828 Contact with and (suspected) exposure to other viral communicable diseases: Secondary | ICD-10-CM | POA: Diagnosis not present

## 2019-01-18 LAB — SARS CORONAVIRUS 2 (TAT 6-24 HRS): SARS Coronavirus 2: NEGATIVE

## 2019-01-19 ENCOUNTER — Other Ambulatory Visit: Payer: Self-pay | Admitting: Family Medicine

## 2019-01-19 NOTE — Telephone Encounter (Signed)
Last office visit 11/23/2018 for follow up.  Last refilled 12/19/2018 for #60 with no refills.  CPE scheduled for 09/07/2019.

## 2019-01-19 NOTE — Discharge Instructions (Signed)

## 2019-01-20 ENCOUNTER — Other Ambulatory Visit: Payer: Self-pay | Admitting: Family Medicine

## 2019-01-22 ENCOUNTER — Ambulatory Visit: Payer: BC Managed Care – PPO | Admitting: Anesthesiology

## 2019-01-22 ENCOUNTER — Encounter
Admission: RE | Disposition: A | Discharge status: Home / Self Care | Payer: Self-pay | Source: Home / Self Care | Attending: Gastroenterology

## 2019-01-22 ENCOUNTER — Ambulatory Visit
Admission: RE | Admit: 2019-01-22 | Discharge: 2019-01-22 | Disposition: A | Discharge status: Home / Self Care | Payer: BC Managed Care – PPO | Attending: Gastroenterology | Admitting: Gastroenterology

## 2019-01-22 ENCOUNTER — Other Ambulatory Visit: Payer: Self-pay

## 2019-01-22 DIAGNOSIS — J309 Allergic rhinitis, unspecified: Secondary | ICD-10-CM | POA: Insufficient documentation

## 2019-01-22 DIAGNOSIS — J45909 Unspecified asthma, uncomplicated: Secondary | ICD-10-CM | POA: Insufficient documentation

## 2019-01-22 DIAGNOSIS — F410 Panic disorder [episodic paroxysmal anxiety] without agoraphobia: Secondary | ICD-10-CM | POA: Diagnosis not present

## 2019-01-22 DIAGNOSIS — K640 First degree hemorrhoids: Secondary | ICD-10-CM | POA: Diagnosis not present

## 2019-01-22 DIAGNOSIS — K5730 Diverticulosis of large intestine without perforation or abscess without bleeding: Secondary | ICD-10-CM | POA: Diagnosis not present

## 2019-01-22 DIAGNOSIS — K219 Gastro-esophageal reflux disease without esophagitis: Secondary | ICD-10-CM | POA: Diagnosis not present

## 2019-01-22 DIAGNOSIS — Z79899 Other long term (current) drug therapy: Secondary | ICD-10-CM | POA: Diagnosis not present

## 2019-01-22 DIAGNOSIS — I10 Essential (primary) hypertension: Secondary | ICD-10-CM | POA: Diagnosis not present

## 2019-01-22 DIAGNOSIS — Z1211 Encounter for screening for malignant neoplasm of colon: Secondary | ICD-10-CM

## 2019-01-22 DIAGNOSIS — D125 Benign neoplasm of sigmoid colon: Secondary | ICD-10-CM | POA: Diagnosis not present

## 2019-01-22 DIAGNOSIS — K635 Polyp of colon: Secondary | ICD-10-CM

## 2019-01-22 HISTORY — DX: Essential (primary) hypertension: I10

## 2019-01-22 HISTORY — PX: POLYPECTOMY: SHX5525

## 2019-01-22 HISTORY — PX: COLONOSCOPY WITH PROPOFOL: SHX5780

## 2019-01-22 SURGERY — COLONOSCOPY WITH PROPOFOL
Anesthesia: General | Site: Rectum

## 2019-01-22 MED ORDER — PROPOFOL 10 MG/ML IV BOLUS
INTRAVENOUS | Status: DC | PRN
  Administered 2019-01-22: 20 mg via INTRAVENOUS
  Administered 2019-01-22: 50 mg via INTRAVENOUS
  Administered 2019-01-22 (×3): 20 mg via INTRAVENOUS
  Administered 2019-01-22: 150 mg via INTRAVENOUS

## 2019-01-22 MED ORDER — LACTATED RINGERS IV SOLN
INTRAVENOUS | Status: DC
  Administered 2019-01-22: 09:00:00 via INTRAVENOUS

## 2019-01-22 MED ORDER — STERILE WATER FOR IRRIGATION IR SOLN
Status: DC | PRN
  Administered 2019-01-22: .05 mL

## 2019-01-22 MED ORDER — LIDOCAINE HCL (CARDIAC) PF 100 MG/5ML IV SOSY
PREFILLED_SYRINGE | INTRAVENOUS | Status: DC | PRN
  Administered 2019-01-22: 30 mg via INTRAVENOUS

## 2019-01-22 SURGICAL SUPPLY — 7 items
CANISTER SUCT 1200ML W/VALVE (MISCELLANEOUS) ×3 IMPLANT
GOWN CVR UNV OPN BCK APRN NK (MISCELLANEOUS) ×4 IMPLANT
GOWN ISOL THUMB LOOP REG UNIV (MISCELLANEOUS) ×2
KIT ENDO PROCEDURE OLY (KITS) ×3 IMPLANT
SNARE SHORT THROW 13M SML OVAL (MISCELLANEOUS) ×3 IMPLANT
TRAP ETRAP POLY (MISCELLANEOUS) ×3 IMPLANT
WATER STERILE IRR 250ML POUR (IV SOLUTION) ×3 IMPLANT

## 2019-01-22 NOTE — Anesthesia Procedure Notes (Signed)
Performed by: Melessia Kaus, CRNA Pre-anesthesia Checklist: Patient identified, Emergency Drugs available, Suction available, Timeout performed and Patient being monitored Patient Re-evaluated:Patient Re-evaluated prior to induction Oxygen Delivery Method: Nasal cannula Placement Confirmation: positive ETCO2       

## 2019-01-22 NOTE — Anesthesia Postprocedure Evaluation (Signed)
Anesthesia Post Note  Patient: Blake Molina  Procedure(s) Performed: COLONOSCOPY WITH PROPOFOL (N/A Rectum) POLYPECTOMY (Rectum)  Patient location during evaluation: PACU Anesthesia Type: General Level of consciousness: awake and alert Pain management: pain level controlled Vital Signs Assessment: post-procedure vital signs reviewed and stable Respiratory status: spontaneous breathing, nonlabored ventilation, respiratory function stable and patient connected to nasal cannula oxygen Cardiovascular status: blood pressure returned to baseline and stable Postop Assessment: no apparent nausea or vomiting Anesthetic complications: no    Adele Barthel Ola Raap

## 2019-01-22 NOTE — H&P (Signed)
Lucilla Lame, MD [address] [place], [place] [address] Phone: [phone] Fax : [phone]  Primary Care Physician:  Jinny Sanders, MD Primary Gastroenterologist:  Dr. Allen Norris  Pre-Procedure History & Physical: HPI:  Blake Molina is a 53 y.o. male is here for a screening colonoscopy.   Past Medical History:  Diagnosis Date  . Allergic rhinitis, cause unspecified   . BPH without obstruction/lower urinary tract symptoms   . Congenital hypertrophic pyloric stenosis   . Esophageal reflux   . Heart burn   . History of kidney stones   . Hypertension   . Other chest pain   . Panic disorder without agoraphobia   . Unspecified asthma(493.90)     Past Surgical History:  Procedure Laterality Date  . herniated disc-back surgery  1996  . Stenosis surgery (other)  1960s   hx of pyloric stenosis as infant    Prior to Admission medications   Medication Sig Start Date End Date Taking? Authorizing Provider  Acetaminophen (TYLENOL 8 HOUR PO) Take by mouth.   Yes [provider]  albuterol (VENTOLIN HFA) 108 (90 Base) MCG/ACT inhaler Inhale 1-2 puffs into the lungs every 6 (six) hours as needed for wheezing or shortness of breath. 03/02/17  Yes Bedsole, Amy E, MD  budesonide-formoterol (SYMBICORT) 80-4.5 MCG/ACT inhaler Inhale 2 puffs into the lungs 2 (two) times daily. 08/31/18  Yes Bedsole, Amy E, MD  busPIRone (BUSPAR) 10 MG tablet TAKE 1 TABLET BY MOUTH THREE TIMES A DAY 01/05/19  Yes Bedsole, Amy E, MD  Famotidine (PEPCID AC MAXIMUM STRENGTH PO)  09/07/18  Yes [provider]  loratadine (CLARITIN) 10 MG tablet    Yes [provider]  losartan (COZAAR) 50 MG tablet Take 1 tablet (50 mg total) by mouth daily. 08/28/18  Yes Bedsole, Amy E, MD  pantoprazole (PROTONIX) 40 MG tablet TAKE 1 TABLET BY MOUTH EVERY DAY 12/06/18  Yes Bedsole, Amy E, MD  Probiotic Product (PROBIOTIC DAILY PO) Take by mouth.   Yes [provider]  traMADol  (ULTRAM) 50 MG tablet TAKE 1 TABLET (50 MG TOTAL) BY MOUTH EVERY 12 (TWELVE) HOURS AS NEEDED. 01/19/19  Yes Diona Browner, Amy E, MD    Allergies as of 09/20/2018  . (No Known Allergies)    Family History  Problem Relation Age of Onset  . Coronary artery disease Father        stent age 56  . Hypertension Father   . Allergies Mother   . Asthma Brother   . Allergies Brother   . Hypertension Paternal Grandfather   . Kidney disease Neg Hx   . Prostate cancer Neg Hx   . Kidney cancer Neg Hx   . Bladder Cancer Neg Hx     Social History   Socioeconomic History  . Marital status: Married    Spouse name: Not on file  . Number of children: 3  . Years of education: Not on file  . Highest education level: Not on file  Occupational History  . Occupation: Museum/gallery curator  Social Needs  . Financial resource strain: Not on file  . Food insecurity    Worry: Not on file    Inability: Not on file  . Transportation needs    Medical: Not on file    Non-medical: Not on file  Tobacco Use  . Smoking status: Never Smoker  . Smokeless tobacco: Never Used  Substance and Sexual Activity  . Alcohol use: Yes    Comment:  Occasionally, maybe 2x/month   . Drug use: No  . Sexual activity: Not on file  Lifestyle  . Physical activity    Days per week: Not on file    Minutes per session: Not on file  . Stress: Not on file  Relationships  . Social Herbalist on phone: Not on file    Gets together: Not on file    Attends religious service: Not on file    Active member of club or organization: Not on file    Attends meetings of clubs or organizations: Not on file    Relationship status: Not on file  . Intimate partner violence    Fear of current or ex partner: Not on file    Emotionally abused: Not on file    Physically abused: Not on file    Forced sexual activity: Not on file  Other Topics Concern  . Not on file  Social History Narrative   Regularly exercises- walks at work,  elliptical. 3 healthy children. Occupation Museum/gallery curator. Diet: 2 meals, fruits/veggies., 1-2x/week FF          Review of Systems: See HPI, otherwise negative ROS  Physical Exam: BP 121/86   Pulse 88   Temp 97.9 F (36.6 C) (Temporal)   Resp 18   Ht 5\' 9"  (1.753 m)   Wt 65.5 kg   SpO2 100%   BMI 21.34 kg/m  General:   Alert,  pleasant and cooperative in NAD Head:  Normocephalic and atraumatic. Neck:  Supple; no masses or thyromegaly. Lungs:  Clear throughout to auscultation.    Heart:  Regular rate and rhythm. Abdomen:  Soft, nontender and nondistended. Normal bowel sounds, without guarding, and without rebound.   Neurologic:  Alert and  oriented x4;  grossly normal neurologically.  Impression/Plan: Blake Molina is now here to undergo a screening colonoscopy.  Risks, benefits, and alternatives regarding colonoscopy have been reviewed with the patient.  Questions have been answered.  All parties agreeable.

## 2019-01-22 NOTE — Op Note (Signed)
[facility] Gastroenterology Patient Name: Blake Molina Procedure Date: 01/22/2019 9:33 AM MRN: [id number] Account #: [id number] Date of Birth: [birth year] Admit Type: Outpatient Age: 53 Room: [facility] OR ROOM 01 Gender: Male Note Status: Finalized Procedure:            Colonoscopy Indications:          Screening for colorectal malignant neoplasm Providers:            Lucilla Lame MD, MD Referring MD:         Jinny Sanders MD, MD (Referring MD) Medicines:            Propofol per Anesthesia Complications:        No immediate complications. Procedure:            Pre-Anesthesia Assessment:                       - Prior to the procedure, a History and Physical was                        performed, and patient medications and allergies were                        reviewed. The patient's tolerance of previous                        anesthesia was also reviewed. The risks and benefits of                        the procedure and the sedation options and risks were                        discussed with the patient. All questions were                        answered, and informed consent was obtained. Prior                        Anticoagulants: The patient has taken no previous                        anticoagulant or antiplatelet agents. ASA Grade                        Assessment: II - A patient with mild systemic disease.                        After reviewing the risks and benefits, the patient was                        deemed in satisfactory condition to undergo the                        procedure.                       After obtaining informed consent, the colonoscope was                        passed under direct vision. Throughout the procedure,  the patient's blood pressure, pulse, and oxygen                        saturations were monitored continuously. The was                        introduced through the anus and advanced to the the                         cecum, identified by appendiceal orifice and ileocecal                        valve. The colonoscopy was performed without                        difficulty. The patient tolerated the procedure well.                        The quality of the bowel preparation was excellent. Findings:      The perianal and digital rectal examinations were normal.      A 4 mm polyp was found in the sigmoid colon. The polyp was sessile. The       polyp was removed with a cold snare. Resection and retrieval were       complete.      A few small-mouthed diverticula were found in the sigmoid colon.      Non-bleeding internal hemorrhoids were found during retroflexion. The       hemorrhoids were Grade I (internal hemorrhoids that do not prolapse). Impression:           - One 4 mm polyp in the sigmoid colon, removed with a                        cold snare. Resected and retrieved.                       - Diverticulosis in the sigmoid colon.                       - Non-bleeding internal hemorrhoids. Recommendation:       - Discharge patient to home.                       - Resume previous diet.                       - Continue present medications.                       - Await pathology results.                       - Repeat colonoscopy in 5 years if polyp adenoma and 10                        years if hyperplastic Procedure Code(s):    --- Professional ---                       [phone], Colonoscopy, flexible; with removal of tumor(s),  polyp(s), or other lesion(s) by snare technique Diagnosis Code(s):    --- Professional ---                       Z12.11, Encounter for screening for malignant neoplasm                        of colon                       K63.5, Polyp of colon CPT copyright 2019 American Medical Association. All rights reserved. The codes documented in this report are preliminary and upon coder review may  be revised to meet current compliance  requirements. Lucilla Lame MD, MD 01/22/2019 9:52:52 AM This report has been signed electronically. Number of Addenda: 0 Note Initiated On: 01/22/2019 9:33 AM Scope Withdrawal Time: 0 hours 9 minutes 28 seconds  Total Procedure Duration: 0 hours 12 minutes 35 seconds  Estimated Blood Loss: Estimated blood loss: none.      [facility]

## 2019-01-22 NOTE — Transfer of Care (Signed)
Immediate Anesthesia Transfer of Care Note  Patient: Blake Molina  Procedure(s) Performed: COLONOSCOPY WITH PROPOFOL (N/A Rectum) POLYPECTOMY (Rectum)  Patient Location: PACU  Anesthesia Type: No value filed.  Level of Consciousness: awake, alert  and patient cooperative  Airway and Oxygen Therapy: Patient Spontanous Breathing and Patient connected to supplemental oxygen  Post-op Assessment: Post-op Vital signs reviewed, Patient's Cardiovascular Status Stable, Respiratory Function Stable, Patent Airway and No signs of Nausea or vomiting  Post-op Vital Signs: Reviewed and stable  Complications: No apparent anesthesia complications

## 2019-01-23 ENCOUNTER — Encounter: Payer: Self-pay | Admitting: Gastroenterology

## 2019-02-20 ENCOUNTER — Other Ambulatory Visit: Payer: Self-pay | Admitting: Family Medicine

## 2019-02-20 NOTE — Telephone Encounter (Signed)
Last office visit 11/23/2018 for Seasonal Allergic Rhinitis, Anxiety, HTN, GERD.  Last refilled 01/19/2019 for #60 with no refills.  CPE scheduled for 09/07/2019.

## 2019-03-15 ENCOUNTER — Other Ambulatory Visit: Payer: Self-pay | Admitting: Family Medicine

## 2019-03-15 NOTE — Telephone Encounter (Signed)
Spoke with Blake Molina to verify that he did request refill on the pantoprazole.  Last office note mentioned for him to try and wean off PPI over time as able.  Patient states he does need refill.

## 2019-03-22 ENCOUNTER — Other Ambulatory Visit: Payer: Self-pay | Admitting: Family Medicine

## 2019-03-22 NOTE — Telephone Encounter (Signed)
Last office visit 11/23/2018.  Last refilled 02/20/2019 for #60 with no refills. CPE scheduled for 09/07/2019.

## 2019-04-08 ENCOUNTER — Other Ambulatory Visit: Payer: Self-pay | Admitting: Family Medicine

## 2019-04-16 ENCOUNTER — Other Ambulatory Visit: Payer: Self-pay | Admitting: Family Medicine

## 2019-04-16 NOTE — Telephone Encounter (Signed)
Last office visit 11/23/2018 for seasonal allergic rhinitis/anxiety/GERD/HTN.  Last refilled 03/23/2019 for #60 with no refills.  CPE scheduled for 09/07/2019.

## 2019-05-23 ENCOUNTER — Other Ambulatory Visit: Payer: Self-pay | Admitting: Family Medicine

## 2019-05-23 NOTE — Telephone Encounter (Signed)
Last office visit 11/23/2018 for Anxiety, GERD, HTN, Seasonal allergic rhinitis.  Last refilled 04/17/2019 for #60 with no refills.  CPE scheduled for 09/07/2019.

## 2019-05-24 ENCOUNTER — Telehealth: Payer: Self-pay | Admitting: Family Medicine

## 2019-05-24 NOTE — Telephone Encounter (Signed)
Patient called and he's been out of medication since yesterday.

## 2019-05-24 NOTE — Telephone Encounter (Signed)
In office please

## 2019-05-24 NOTE — Telephone Encounter (Signed)
Patient called.  Patient said he wakes up not feeling well in the morning and upset stomach. Patient said it's mild.  Patient said around 2:00 he has nausea.  Patient thinks it has something to do with his blood pressure medication.  Patient said it's been going on for months.  Due to the nausea, I scheduled patient on 05/29/19 for a virtual visit.  Patient said he'd like to come in to the office to get his blood pressure checked.  Patient said his blood pressure readings are always different from the results he gets at the office. Do you want patient to have a virtual appointment of in office?

## 2019-05-25 NOTE — Telephone Encounter (Signed)
Patient notified to come in to the office.

## 2019-05-29 ENCOUNTER — Telehealth: Payer: Self-pay | Admitting: Family Medicine

## 2019-05-29 ENCOUNTER — Ambulatory Visit: Payer: BC Managed Care – PPO | Admitting: Family Medicine

## 2019-05-29 ENCOUNTER — Encounter: Payer: Self-pay | Admitting: Family Medicine

## 2019-05-29 ENCOUNTER — Other Ambulatory Visit: Payer: Self-pay

## 2019-05-29 VITALS — BP 130/78 | HR 76 | Temp 97.6°F | Ht 69.0 in | Wt 158.0 lb

## 2019-05-29 DIAGNOSIS — K219 Gastro-esophageal reflux disease without esophagitis: Secondary | ICD-10-CM

## 2019-05-29 DIAGNOSIS — I10 Essential (primary) hypertension: Secondary | ICD-10-CM

## 2019-05-29 DIAGNOSIS — G8929 Other chronic pain: Secondary | ICD-10-CM

## 2019-05-29 DIAGNOSIS — R110 Nausea: Secondary | ICD-10-CM

## 2019-05-29 DIAGNOSIS — M5440 Lumbago with sciatica, unspecified side: Secondary | ICD-10-CM

## 2019-05-29 MED ORDER — SUCRALFATE 1 G PO TABS: 1.0000 g | ORAL_TABLET | Freq: Three times a day (TID) | ORAL | 1 refills | Status: DC

## 2019-05-29 MED ORDER — SUCRALFATE 1 GM/10ML PO SUSP: 1.0000 g | Freq: Three times a day (TID) | ORAL | 0 refills | Status: DC

## 2019-05-29 NOTE — Patient Instructions (Addendum)
Decrease losartan to 1/2 tablet daily. Stop tramadol. No NSAIDs like aleve or ibuprofen. We will return to meloxicam 7.5 twice daily with a meal. Start carafate and increase pantoprazole to twice daily x 1-2 week then return to once daily. Decrease caffeine and increase water.

## 2019-05-29 NOTE — Telephone Encounter (Signed)
Sent in as tablets.

## 2019-05-29 NOTE — Assessment & Plan Note (Signed)
Nausea concerning for GER, gastritis or even PUD... but not pain. No clear indicaiton for labs, but consider if not improving.   Stop tramadol, increase PPI temporarily, add carafate.  If not improving at folloew up.. will need non-NSAID treatment for chronic back pain.

## 2019-05-29 NOTE — Progress Notes (Signed)
Chief Complaint  Patient presents with  . Nausea    for months    History of Present Illness: HPI   53 year old male  with GERD presents for new onset nausea in last few months.  He is using  Tramadol twice daily for chronic low back pain. Meloxicam in worked much better without side effects.  Occ still taking meloxicam... helps pain dramaticallyHe is on protonix for GERD. Prn pecid    He reports nausea in AMs, frequently.  No emesis. No epigastric pain. Later in the day if he does not eat... he has nausea occur again. Uncomfortable in pit of stomach.  Improves after eating and taking meds.  Occ acid in throat, feeling of fullness in throat. He has been healthier and thinks that is why he has lost wait.Marland Kitchen less soda, less fries, more fruit.  BP is low today.. he is on losartan 50 mg daily. He has lost 11 lbs in last 3 months. BP Readings from Last 3 Encounters:  01/22/19 98/62  11/23/18 138/74  11/15/18 139/81   Wt Readings from Last 3 Encounters:  01/22/19 144 lb 8 oz (65.5 kg)  11/23/18 155 lb 8 oz (70.5 kg)  11/15/18 155 lb 14.4 oz (70.7 kg)  Body mass index is 21.34 kg/m.   He has been seeing Dr. Aggie Hacker neurosurgeon for back issue ( herniated disc< DDD).. Recommended fusion.  States   This visit occurred during the SARS-CoV-2 public health emergency.  Safety protocols were in place, including screening questions prior to the visit, additional usage of staff PPE, and extensive cleaning of exam room while observing appropriate contact time as indicated for disinfecting solutions.   COVID 19 screen:  No recent travel or known exposure to COVID19 The patient denies respiratory symptoms of COVID 19 at this time. The importance of social distancing was discussed today.     Review of Systems  Cardiovascular: Positive for palpitations.     notes finger tips..when cold.Marland Kitchen go numb and turn whtie.   Past Medical History:  Diagnosis Date  . Allergic rhinitis, cause  unspecified   . BPH without obstruction/lower urinary tract symptoms   . Congenital hypertrophic pyloric stenosis   . Esophageal reflux   . Heart burn   . History of kidney stones   . Hypertension   . Other chest pain   . Panic disorder without agoraphobia   . Unspecified asthma(493.90)     reports that he has never smoked. He has never used smokeless tobacco. He reports current alcohol use. He reports that he does not use drugs.   Current Outpatient Medications:  .  Acetaminophen (TYLENOL 8 HOUR PO), Take by mouth., Disp: , Rfl:  .  albuterol (VENTOLIN HFA) 108 (90 Base) MCG/ACT inhaler, Inhale 1-2 puffs into the lungs every 6 (six) hours as needed for wheezing or shortness of breath., Disp: 3 Inhaler, Rfl: 1 .  budesonide-formoterol (SYMBICORT) 80-4.5 MCG/ACT inhaler, Inhale 2 puffs into the lungs 2 (two) times daily., Disp: 1 Inhaler, Rfl: 3 .  busPIRone (BUSPAR) 10 MG tablet, TAKE 1 TABLET BY MOUTH THREE TIMES A DAY, Disp: 270 tablet, Rfl: 1 .  Famotidine (PEPCID AC MAXIMUM STRENGTH PO), daily as needed. , Disp: , Rfl:  .  loratadine (CLARITIN) 10 MG tablet, , Disp: , Rfl:  .  losartan (COZAAR) 50 MG tablet, Take 1 tablet (50 mg total) by mouth daily., Disp: 30 tablet, Rfl: 11 .  pantoprazole (PROTONIX) 40 MG tablet, TAKE 1 TABLET BY MOUTH  EVERY DAY, Disp: 90 tablet, Rfl: 0 .  Probiotic Product (PROBIOTIC DAILY PO), Take by mouth., Disp: , Rfl:  .  traMADol (ULTRAM) 50 MG tablet, TAKE 1 TABLET (50 MG TOTAL) BY MOUTH EVERY 12 (TWELVE) HOURS AS NEEDED., Disp: 60 tablet, Rfl: 0   Observations/Objective: Height 5\' 9"  (1.753 m).  Physical Exam Constitutional:      Appearance: He is well-developed.  HENT:     Head: Normocephalic.     Right Ear: Hearing normal.     Left Ear: Hearing normal.     Nose: Nose normal.  Neck:     Thyroid: No thyroid mass or thyromegaly.     Vascular: No carotid bruit.     Trachea: Trachea normal.  Cardiovascular:     Rate and Rhythm: Normal rate and  regular rhythm.     Pulses: Normal pulses.     Heart sounds: Heart sounds not distant. No murmur. No friction rub. No gallop.      Comments: No peripheral edema Pulmonary:     Effort: Pulmonary effort is normal. No respiratory distress.     Breath sounds: Normal breath sounds.  Skin:    General: Skin is warm and dry.     Findings: No rash.  Psychiatric:        Speech: Speech normal.        Behavior: Behavior normal.        Thought Content: Thought content normal.      Assessment and Plan   Hypertension, essential, benign With diet changes and weight loss.. low normal. Decrease losartan to 1/2 tab daily.  Follow up in 1-2 weeks.  Chronic low back pain Needs pain control regimen if not able to continue tramadol.  Meloxicam helped in past without stomach pain per pt. Will restart and follow closely.   In past failed steroid injection,  PT, had surgery 20 years ago. Could consider med like gabapentin, lyrica etc.  G E R D Nausea concerning for GER, gastritis or even PUD... but not pain. No clear indicaiton for labs, but consider if not improving.   Stop tramadol, increase PPI temporarily, add carafate.  If not improving at folloew up.. will need non-NSAID treatment for chronic back pain.       [name], MD

## 2019-05-29 NOTE — Assessment & Plan Note (Addendum)
Needs pain control regimen if not able to continue tramadol.  Meloxicam helped in past without stomach pain per pt. Will restart and follow closely.   In past failed steroid injection,  PT, had surgery 20 years ago. Could consider med like gabapentin, lyrica etc.

## 2019-05-29 NOTE — Assessment & Plan Note (Signed)
With diet changes and weight loss.. low normal. Decrease losartan to 1/2 tab daily.  Follow up in 1-2 weeks.

## 2019-05-29 NOTE — Telephone Encounter (Signed)
Patient was seen today and prescribed Sucralfate. The insurance won't cover liquid and [facility] wants to know if it can be changed to tablets.

## 2019-06-01 ENCOUNTER — Other Ambulatory Visit: Payer: Self-pay | Admitting: Family Medicine

## 2019-06-01 NOTE — Telephone Encounter (Signed)
Last office visit 05/29/2019 for nausea, HTN, GERD and back pain.  Last refilled ?  Listed as historical medication.  Next appt: 06/12/2019 for follow up nausea.

## 2019-06-12 ENCOUNTER — Encounter: Payer: Self-pay | Admitting: Family Medicine

## 2019-06-12 ENCOUNTER — Ambulatory Visit: Payer: BC Managed Care – PPO | Admitting: Family Medicine

## 2019-06-12 ENCOUNTER — Other Ambulatory Visit: Payer: Self-pay

## 2019-06-12 VITALS — BP 114/60 | HR 83 | Temp 98.5°F | Ht 69.0 in | Wt 160.0 lb

## 2019-06-12 DIAGNOSIS — M25561 Pain in right knee: Secondary | ICD-10-CM

## 2019-06-12 DIAGNOSIS — R110 Nausea: Secondary | ICD-10-CM

## 2019-06-12 DIAGNOSIS — M5440 Lumbago with sciatica, unspecified side: Secondary | ICD-10-CM | POA: Diagnosis not present

## 2019-06-12 DIAGNOSIS — I10 Essential (primary) hypertension: Secondary | ICD-10-CM

## 2019-06-12 DIAGNOSIS — K219 Gastro-esophageal reflux disease without esophagitis: Secondary | ICD-10-CM

## 2019-06-12 DIAGNOSIS — M25562 Pain in left knee: Secondary | ICD-10-CM

## 2019-06-12 DIAGNOSIS — G8929 Other chronic pain: Secondary | ICD-10-CM

## 2019-06-12 MED ORDER — PANTOPRAZOLE SODIUM 40 MG PO TBEC: 40.0000 mg | DELAYED_RELEASE_TABLET | Freq: Every day | ORAL | 0 refills | Status: DC

## 2019-06-12 MED ORDER — GABAPENTIN 100 MG PO CAPS: ORAL_CAPSULE | ORAL | 3 refills | Status: DC

## 2019-06-12 NOTE — Assessment & Plan Note (Signed)
Poor control. Increase PPI to BID. Stop/decrease tramadol, if not improving consider follow up with GI MD.

## 2019-06-12 NOTE — Assessment & Plan Note (Addendum)
Decrease/ stop tramadol as may be causing abd pain.  Meloxicam and other NSAIDS contraindicated.   trial of gabapentin for  Neuropathic pain.

## 2019-06-12 NOTE — Progress Notes (Signed)
Chief Complaint  Patient presents with  . Follow-up    Nausea/BP    History of Present Illness: HPI   53 year old male presents for 2 week follow up on nausea and GERD, without epigastric pain .   At last appt.. tramadol was stopped... restarted low dose meloxicam. Increased PPI to BID dosing.  Started on sucralfate TID.   Today he reports meloxicam helped his joints tremendously but his stomach was very upset.  Stopped this and restarted the tramadol.   He reports intermittant nausea and frequent GERD.  Still no abdominal pain.  no emesis, no blood, no dark stool.     He has started regaining a small amount of weight. Wt Readings from Last 3 Encounters:  06/12/19 160 lb (72.6 kg)  05/29/19 158 lb (71.7 kg)  01/22/19 144 lb 8 oz (65.5 kg)     Continued control of BP on .. he never decreased the dose of losartan ( 1/2 tab daily).  Occ lightheadedness.  BP Readings from Last 3 Encounters:  06/12/19 114/60  05/29/19 130/78  01/22/19 98/62   Past GI MD Dr. Allen Norris.    This visit occurred during the SARS-CoV-2 public health emergency.  Safety protocols were in place, including screening questions prior to the visit, additional usage of staff PPE, and extensive cleaning of exam room while observing appropriate contact time as indicated for disinfecting solutions.   COVID 19 screen:  No recent travel or known exposure to COVID19 The patient denies respiratory symptoms of COVID 19 at this time. The importance of social distancing was discussed today.     Review of Systems  Constitutional: Negative for chills and fever.  HENT: Negative for congestion and ear pain.   Eyes: Negative for pain and redness.  Respiratory: Negative for cough and shortness of breath.   Cardiovascular: Negative for chest pain, palpitations and leg swelling.  Gastrointestinal: Negative for abdominal pain, blood in stool, constipation, diarrhea, nausea and vomiting.  Genitourinary: Negative for  dysuria.  Musculoskeletal: Negative for falls and myalgias.  Skin: Negative for rash.  Neurological: Negative for dizziness.  Psychiatric/Behavioral: Negative for depression. The patient is not nervous/anxious.       Past Medical History:  Diagnosis Date  . Allergic rhinitis, cause unspecified   . BPH without obstruction/lower urinary tract symptoms   . Congenital hypertrophic pyloric stenosis   . Esophageal reflux   . Heart burn   . History of kidney stones   . Hypertension   . Other chest pain   . Panic disorder without agoraphobia   . Unspecified asthma(493.90)     reports that he has never smoked. He has never used smokeless tobacco. He reports current alcohol use. He reports that he does not use drugs.   Current Outpatient Medications:  .  Acetaminophen (TYLENOL 8 HOUR PO), Take by mouth., Disp: , Rfl:  .  albuterol (VENTOLIN HFA) 108 (90 Base) MCG/ACT inhaler, Inhale 1-2 puffs into the lungs every 6 (six) hours as needed for wheezing or shortness of breath., Disp: 3 Inhaler, Rfl: 1 .  budesonide-formoterol (SYMBICORT) 80-4.5 MCG/ACT inhaler, Inhale 2 puffs into the lungs 2 (two) times daily., Disp: 1 Inhaler, Rfl: 3 .  busPIRone (BUSPAR) 10 MG tablet, TAKE 1 TABLET BY MOUTH THREE TIMES A DAY, Disp: 270 tablet, Rfl: 1 .  Famotidine (PEPCID AC MAXIMUM STRENGTH PO), daily as needed. , Disp: , Rfl:  .  loratadine (CLARITIN) 10 MG tablet, , Disp: , Rfl:  .  losartan (COZAAR)  50 MG tablet, Take 1 tablet (50 mg total) by mouth daily., Disp: 30 tablet, Rfl: 11 .  meloxicam (MOBIC) 7.5 MG tablet, TAKE 1 TABLET (7.5 MG TOTAL) BY MOUTH 2 (TWO) TIMES DAILY. (Patient not taking: Reported on 06/12/2019), Disp: 180 tablet, Rfl: 0 .  pantoprazole (PROTONIX) 40 MG tablet, TAKE 1 TABLET BY MOUTH EVERY DAY, Disp: 90 tablet, Rfl: 0 .  Probiotic Product (PROBIOTIC DAILY PO), Take by mouth., Disp: , Rfl:  .  sucralfate (CARAFATE) 1 g tablet, Take 1 tablet (1 g total) by mouth 4 (four) times daily -   with meals and at bedtime., Disp: 30 tablet, Rfl: 1 .  traMADol (ULTRAM) 50 MG tablet, TAKE 1 TABLET (50 MG TOTAL) BY MOUTH EVERY 12 (TWELVE) HOURS AS NEEDED., Disp: 60 tablet, Rfl: 0   Observations/Objective: Blood pressure 114/60, pulse 83, temperature 98.5 F (36.9 C), temperature source Temporal, height 5\' 9"  (1.753 m), weight 160 lb (72.6 kg), SpO2 97 %.  Physical Exam Constitutional:      Appearance: He is well-developed.  HENT:     Head: Normocephalic.     Right Ear: Hearing normal.     Left Ear: Hearing normal.     Nose: Nose normal.  Neck:     Thyroid: No thyroid mass or thyromegaly.     Vascular: No carotid bruit.     Trachea: Trachea normal.  Cardiovascular:     Rate and Rhythm: Normal rate and regular rhythm.     Pulses: Normal pulses.     Heart sounds: Heart sounds not distant. No murmur. No friction rub. No gallop.      Comments: No peripheral edema Pulmonary:     Effort: Pulmonary effort is normal. No respiratory distress.     Breath sounds: Normal breath sounds.  Skin:    General: Skin is warm and dry.     Findings: No rash.  Psychiatric:        Speech: Speech normal.        Behavior: Behavior normal.        Thought Content: Thought content normal.      Assessment and Plan Hypertension, essential, benign Still low and pt now reporting symptoms. He will now go ahead and decrease down to 1/2 tab daily of losartan.  Chronic low back pain  Decrease/ stop tramadol as may be causing abd pain.  Meloxicam and other NSAIDS contraindicated.   trial of gabapentin for  Neuropathic pain.  G E R D Poor control. Increase PPI to BID. Stop/decrease tramadol, if not improving consider follow up with GI MD.  Bilateral knee pain  Stop NSAIDS.Marland Kitchen but can try topical diclofenac for knee pain prn. Has some at home.       [name], MD

## 2019-06-12 NOTE — Assessment & Plan Note (Signed)
Stop NSAIDS.Marland Kitchen but can try topical diclofenac for knee pain prn. Has some at home.

## 2019-06-12 NOTE — Assessment & Plan Note (Signed)
Still low and pt now reporting symptoms. He will now go ahead and decrease down to 1/2 tab daily of losartan.

## 2019-06-12 NOTE — Patient Instructions (Addendum)
Decrease losartan to 1/2 tab daily. Follow BP occ at home.  Increase Protonix to twice daily x 2 weeks. Decrease or stop tramadol. Can use topical diclofenac on knees as needed. Call to set up an appt with Dr. Allen Norris.. for GERD if not better.

## 2019-06-25 ENCOUNTER — Other Ambulatory Visit: Payer: Self-pay | Admitting: Family Medicine

## 2019-06-25 NOTE — Telephone Encounter (Signed)
Last office visit 06/12/2019 for Nausea & BP.  Last refilled 05/24/2019 for #60 with no refills.  CPE scheduled for 09/07/2019.

## 2019-07-25 ENCOUNTER — Other Ambulatory Visit: Payer: Self-pay | Admitting: Family Medicine

## 2019-07-26 NOTE — Telephone Encounter (Signed)
Last office visit 06/12/2019 for follow up nausea/BP.  Last refilled 06/27/2019 for #60 with no refills.  CPE scheduled for 09/07/2019.  Last AVS states to decrease or stop Tramadol.

## 2019-07-27 ENCOUNTER — Telehealth: Payer: Self-pay

## 2019-07-27 ENCOUNTER — Encounter: Payer: Self-pay | Admitting: Family Medicine

## 2019-07-27 ENCOUNTER — Ambulatory Visit (INDEPENDENT_AMBULATORY_CARE_PROVIDER_SITE_OTHER): Payer: BC Managed Care – PPO | Admitting: Family Medicine

## 2019-07-27 VITALS — Temp 98.4°F | Ht 69.0 in

## 2019-07-27 DIAGNOSIS — G8929 Other chronic pain: Secondary | ICD-10-CM

## 2019-07-27 DIAGNOSIS — M5440 Lumbago with sciatica, unspecified side: Secondary | ICD-10-CM

## 2019-07-27 DIAGNOSIS — K219 Gastro-esophageal reflux disease without esophagitis: Secondary | ICD-10-CM | POA: Diagnosis not present

## 2019-07-27 DIAGNOSIS — J309 Allergic rhinitis, unspecified: Secondary | ICD-10-CM

## 2019-07-27 MED ORDER — GABAPENTIN 100 MG PO CAPS: ORAL_CAPSULE | ORAL | 1 refills | Status: DC

## 2019-07-27 MED ORDER — PREDNISONE 10 MG PO TABS: ORAL_TABLET | ORAL | 0 refills | Status: DC

## 2019-07-27 NOTE — Telephone Encounter (Signed)
Pt said had visit earlier today and when pt went to [facility] said [facility] with new instructions of taking Gabapentin 200 mg in the morning and 100 mg midday. Pt picked up the prednisone and tramadol. Pt said he does not need gabapentin now because he has gabapentin 100 mg at home. Pt did say some days with his pain he has to take the 2nd tramadol during the day. Pt said now the rx reads take one daily as needed. Pt said he will try taking the gabapentin 200 mg in AM and 100 mg midday and if needed will take the tramadol once a day but if pt see he needs the second tramadol later in the day he will call and ask Dr Diona Browner to write new rx for tramadol. Pt wanted Dr Diona Browner to be aware and no action is needed at this time. FYI to Dr Diona Browner.

## 2019-07-27 NOTE — Telephone Encounter (Signed)
Agreed... was supposed to decrease or stop tramadol.  Has abdominal pain/reflux resolved?

## 2019-07-27 NOTE — Telephone Encounter (Signed)
Spoke with Blake Molina.  He states he is still taking the Tramadol but mainly taking one tablet in the morning.  He states his stomach issue is much better.

## 2019-07-27 NOTE — Telephone Encounter (Signed)
Gaines actually has a virtual visit with you today at 10:40 am for sinus issues.  Can discuss then.

## 2019-07-27 NOTE — Assessment & Plan Note (Signed)
Not clearly infectious... but if not improving consider COVID testing and bacterial infeciton. Restart antihistamine.  Complete pred taper.  Start nasal saline.  No current asthma exacerbation.

## 2019-07-27 NOTE — Assessment & Plan Note (Signed)
Improving with gabapentin. Able to decrease tramadol dose to once daily.  He is willing to wean down further and instead try higher dose gabapentin.

## 2019-07-27 NOTE — Patient Instructions (Addendum)
Complete course of prednisone.  Restart claritin.  Start nasal saline spray. Hold sudafed.  Can use tylenol for headache.   If not improving in 48 -72 hours.. consider COVID testing.  Isolate until improving.  If continuing to worsen, or new fever, once side face pain after 7-10 days.   Increase gabapentin 200 mg in monring and 100 mg midday.  Try to decrease tramadol further.

## 2019-07-27 NOTE — Telephone Encounter (Signed)
Noted. Goal is to wean off tramadol and replace with gabapentin.

## 2019-07-27 NOTE — Progress Notes (Signed)
VIRTUAL VISIT Due to national recommendations of social distancing due to [place] 19, a virtual visit is felt to be most appropriate for this patient at this time.   I connected with the patient on 07/27/19 at 10:40 AM EST by virtual telehealth platform and verified that I am speaking with the correct person using two identifiers.   I discussed the limitations, risks, security and privacy concerns of performing an evaluation and management service by  virtual telehealth platform and the availability of in person appointments. I also discussed with the patient that there may be a patient responsible charge related to this service. The patient expressed understanding and agreed to proceed.  Patient location: Home Provider Location: [facility] [place] Participants: Blake Molina and [name]   Chief Complaint  Patient presents with  . Nasal Congestion  . Facial Pain    History of Present Illness:  54 year old male presents with new onset pain in frontal sinus x 3 days  Few days ago her was cleaning and buffing a trailer.. started after this.  He has nasal congestion, no cough , no sneeze.  No ear pain, no ST.  no fever.  Has used tylenol and  sudafed   Hx of  Asthma... mild persistent... no current exacerbation.  Using symbicort daily. Using nasocort off and on. No recent albuterol use.  no SOB.    Restarted tramadol given more stomach upset with meloxicam.  Today he reports GERD and abdominal pain improved.   He has been able to decrease to once a day tramadol.  Gabapentin BID seems to be helping with back pain.     COVID 19 screen No recent travel or known exposure to COVID19 The patient denies respiratory symptoms of COVID 19 at this time.  The importance of social distancing was discussed today.   Review of Systems  Constitutional: Negative for chills and fever.  HENT: Negative for congestion and ear pain.   Eyes: Negative for pain and redness.  Respiratory:  Negative for cough and shortness of breath.   Cardiovascular: Negative for chest pain, palpitations and leg swelling.  Gastrointestinal: Negative for abdominal pain, blood in stool, constipation, diarrhea, nausea and vomiting.  Genitourinary: Negative for dysuria.  Musculoskeletal: Positive for back pain. Negative for falls and myalgias.  Skin: Negative for rash.  Neurological: Negative for dizziness.  Psychiatric/Behavioral: Negative for depression. The patient is not nervous/anxious.       Past Medical History:  Diagnosis Date  . Allergic rhinitis, cause unspecified   . BPH without obstruction/lower urinary tract symptoms   . Congenital hypertrophic pyloric stenosis   . Esophageal reflux   . Heart burn   . History of kidney stones   . Hypertension   . Other chest pain   . Panic disorder without agoraphobia   . Unspecified asthma(493.90)     reports that he has never smoked. He has never used smokeless tobacco. He reports current alcohol use. He reports that he does not use drugs.   Current Outpatient Medications:  .  Acetaminophen (TYLENOL 8 HOUR PO), Take by mouth., Disp: , Rfl:  .  albuterol (VENTOLIN HFA) 108 (90 Base) MCG/ACT inhaler, Inhale 1-2 puffs into the lungs every 6 (six) hours as needed for wheezing or shortness of breath., Disp: 3 Inhaler, Rfl: 1 .  budesonide-formoterol (SYMBICORT) 80-4.5 MCG/ACT inhaler, Inhale 2 puffs into the lungs 2 (two) times daily., Disp: 1 Inhaler, Rfl: 3 .  busPIRone (BUSPAR) 10 MG tablet, TAKE 1 TABLET  BY MOUTH THREE TIMES A DAY, Disp: 270 tablet, Rfl: 1 .  Famotidine (PEPCID AC MAXIMUM STRENGTH PO), daily as needed. , Disp: , Rfl:  .  gabapentin (NEURONTIN) 100 MG capsule, Start 100 mg  Daily, can increase to TID if tolerated., Disp: 90 capsule, Rfl: 3 .  loratadine (CLARITIN) 10 MG tablet, , Disp: , Rfl:  .  losartan (COZAAR) 50 MG tablet, Take 0.5 tablets (25 mg total) by mouth daily., Disp: 45 tablet, Rfl: 1 .  pantoprazole (PROTONIX)  40 MG tablet, TAKE 1 TABLET BY MOUTH EVERY DAY, Disp: 90 tablet, Rfl: 1 .  Probiotic Product (PROBIOTIC DAILY PO), Take by mouth., Disp: , Rfl:  .  sucralfate (CARAFATE) 1 g tablet, Take 1 tablet (1 g total) by mouth 4 (four) times daily -  with meals and at bedtime., Disp: 30 tablet, Rfl: 1 .  traMADol (ULTRAM) 50 MG tablet, TAKE 1 TABLET (50 MG TOTAL) BY MOUTH EVERY 12 (TWELVE) HOURS AS NEEDED., Disp: 60 tablet, Rfl: 0   Observations/Objective: Temperature 98.4 F (36.9 C), temperature source Oral, height 5\' 9"  (1.753 m).  Physical Exam  Physical Exam Constitutional:      General: The patient is not in acute distress. Pulmonary:     Effort: Pulmonary effort is normal. No respiratory distress.  Neurological:     Mental Status: The patient is alert and oriented to person, place, and time.  Psychiatric:        Mood and Affect: Mood normal.        Behavior: Behavior normal.   Assessment and Plan    I discussed the assessment and treatment plan with the patient. The patient was provided an opportunity to ask questions and all were answered. The patient agreed with the plan and demonstrated an understanding of the instructions.   The patient was advised to call back or seek an in-person evaluation if the symptoms worsen or if the condition fails to improve as anticipated.  Allergic sinusitis Not clearly infectious... but if not improving consider COVID testing and bacterial infeciton. Restart antihistamine.  Complete pred taper.  Start nasal saline.  No current asthma exacerbation.  Chronic low back pain with sciatica Improving with gabapentin. Able to decrease tramadol dose to once daily.  He is willing to wean down further and instead try higher dose gabapentin.  Gastroesophageal reflux disease Avoid NSAIDs and  Trying to wean off tramadol. Improved control.      Blake Lofts, MD

## 2019-07-27 NOTE — Assessment & Plan Note (Signed)
Avoid NSAIDs and  Trying to wean off tramadol. Improved control.

## 2019-08-04 ENCOUNTER — Other Ambulatory Visit: Payer: Self-pay | Admitting: Family Medicine

## 2019-08-07 ENCOUNTER — Encounter: Payer: Self-pay | Admitting: Family Medicine

## 2019-08-07 ENCOUNTER — Other Ambulatory Visit: Payer: Self-pay

## 2019-08-07 ENCOUNTER — Ambulatory Visit: Payer: BC Managed Care – PPO | Admitting: Family Medicine

## 2019-08-07 VITALS — BP 118/74 | HR 100 | Temp 97.9°F | Ht 69.0 in | Wt 162.5 lb

## 2019-08-07 DIAGNOSIS — G8929 Other chronic pain: Secondary | ICD-10-CM

## 2019-08-07 DIAGNOSIS — K219 Gastro-esophageal reflux disease without esophagitis: Secondary | ICD-10-CM

## 2019-08-07 DIAGNOSIS — I10 Essential (primary) hypertension: Secondary | ICD-10-CM | POA: Diagnosis not present

## 2019-08-07 DIAGNOSIS — M5440 Lumbago with sciatica, unspecified side: Secondary | ICD-10-CM

## 2019-08-07 MED ORDER — PANTOPRAZOLE SODIUM 40 MG PO TBEC: 40.0000 mg | DELAYED_RELEASE_TABLET | Freq: Two times a day (BID) | ORAL | 1 refills | Status: DC

## 2019-08-07 NOTE — Assessment & Plan Note (Signed)
Well controlled. Continue current medication. Encouraged exercise, weight loss, healthy eating habits.  

## 2019-08-07 NOTE — Progress Notes (Signed)
Chief Complaint  Patient presents with  . Follow-up    Blood Pressure    History of Present Illness: HPI   54 year old male presents for blood pressure.  Hypertension:   Well controlled on losartan 25 mg daily.   BP Readings from Last 3 Encounters:  08/07/19 118/74  06/12/19 114/60  05/29/19 130/78  Using medication without problems or lightheadedness:  none Chest pain with exertion:none Edema:none Short of breath:none Average home BPs: Other issues:   GERD improved control on pantoprazole twice daily now for last 2 weeks.  Chronic back pain Now on gabapentin 200 mg in AM and 100 mg midday. Not sure it has helped  No sedation.  Has been able to  decrease tramadol to 1 tab in AM and 1/2 tab in late in afternoon.  This visit occurred during the SARS-CoV-2 public health emergency.  Safety protocols were in place, including screening questions prior to the visit, additional usage of staff PPE, and extensive cleaning of exam room while observing appropriate contact time as indicated for disinfecting solutions.   COVID 19 screen:  No recent travel or known exposure to COVID19 The patient denies respiratory symptoms of COVID 19 at this time. The importance of social distancing was discussed today.     Review of Systems  Constitutional: Negative for chills and fever.  HENT: Negative for congestion and ear pain.   Eyes: Negative for pain and redness.  Respiratory: Negative for cough and shortness of breath.   Cardiovascular: Negative for chest pain, palpitations and leg swelling.  Gastrointestinal: Negative for abdominal pain, blood in stool, constipation, diarrhea, nausea and vomiting.  Genitourinary: Negative for dysuria.  Musculoskeletal: Positive for back pain. Negative for falls and myalgias.  Skin: Negative for rash.  Neurological: Negative for dizziness.  Psychiatric/Behavioral: Negative for depression. The patient is not nervous/anxious.       Past Medical  History:  Diagnosis Date  . Allergic rhinitis, cause unspecified   . BPH without obstruction/lower urinary tract symptoms   . Congenital hypertrophic pyloric stenosis   . Esophageal reflux   . Heart burn   . History of kidney stones   . Hypertension   . Other chest pain   . Panic disorder without agoraphobia   . Unspecified asthma(493.90)     reports that he has never smoked. He has never used smokeless tobacco. He reports current alcohol use. He reports that he does not use drugs.   Current Outpatient Medications:  .  Acetaminophen (TYLENOL 8 HOUR PO), Take by mouth., Disp: , Rfl:  .  albuterol (VENTOLIN HFA) 108 (90 Base) MCG/ACT inhaler, Inhale 1-2 puffs into the lungs every 6 (six) hours as needed for wheezing or shortness of breath., Disp: 3 Inhaler, Rfl: 1 .  budesonide-formoterol (SYMBICORT) 80-4.5 MCG/ACT inhaler, Inhale 2 puffs into the lungs 2 (two) times daily., Disp: 1 Inhaler, Rfl: 3 .  busPIRone (BUSPAR) 10 MG tablet, TAKE 1 TABLET BY MOUTH THREE TIMES A DAY, Disp: 270 tablet, Rfl: 1 .  Famotidine (PEPCID AC MAXIMUM STRENGTH PO), daily as needed. , Disp: , Rfl:  .  gabapentin (NEURONTIN) 100 MG capsule, Start 200 mg in AM and 100 mg  Mid-day, Disp: 90 capsule, Rfl: 1 .  loratadine (CLARITIN) 10 MG tablet, , Disp: , Rfl:  .  losartan (COZAAR) 50 MG tablet, Take 0.5 tablets (25 mg total) by mouth daily., Disp: 45 tablet, Rfl: 1 .  pantoprazole (PROTONIX) 40 MG tablet, Take 40 mg by mouth 2 (two)  times daily., Disp: , Rfl:  .  Probiotic Product (PROBIOTIC DAILY PO), Take by mouth., Disp: , Rfl:  .  sucralfate (CARAFATE) 1 g tablet, Take 1 tablet (1 g total) by mouth 4 (four) times daily -  with meals and at bedtime., Disp: 30 tablet, Rfl: 1 .  traMADol (ULTRAM) 50 MG tablet, Take 1 tablet (50 mg total) by mouth daily as needed for moderate pain or severe pain. (Patient taking differently: Take 50 mg by mouth every morning. And 1/2 tablet by mouth in the evening), Disp: 30  tablet, Rfl: 0   Observations/Objective: Blood pressure 118/74, pulse 100, temperature 97.9 F (36.6 C), temperature source Temporal, height 5\' 9"  (1.753 m), weight 162 lb 8 oz (73.7 kg), SpO2 98 %.  Physical Exam Constitutional:      Appearance: He is well-developed.  HENT:     Head: Normocephalic.     Right Ear: Hearing normal.     Left Ear: Hearing normal.     Nose: Nose normal.  Neck:     Thyroid: No thyroid mass or thyromegaly.     Vascular: No carotid bruit.     Trachea: Trachea normal.  Cardiovascular:     Rate and Rhythm: Normal rate and regular rhythm.     Pulses: Normal pulses.     Heart sounds: Heart sounds not distant. No murmur. No friction rub. No gallop.      Comments: No peripheral edema Pulmonary:     Effort: Pulmonary effort is normal. No respiratory distress.     Breath sounds: Normal breath sounds.  Skin:    General: Skin is warm and dry.     Findings: No rash.  Psychiatric:        Speech: Speech normal.        Behavior: Behavior normal.        Thought Content: Thought content normal.      Assessment and Plan   Gastroesophageal reflux disease  Stable control  Off NSAIDs, improved diet. No more fast food. Return to pantoprazole once daily.  Hypertension, essential, benign Well controlled. Continue current medication. Encouraged exercise, weight loss, healthy eating habits.   Chronic low back pain with sciatica  Tolerable control on tramadol. Not sure if gabapentin helping.. will continue but does not want to increase.  occ still needs additional tramadol 1/2 tab later in day.  PDMP reviewed.. no red flags.  Ok to make next refill #45.     [name], MD

## 2019-08-07 NOTE — Patient Instructions (Addendum)
Decrease back to pantoprazole once daily.

## 2019-08-07 NOTE — Assessment & Plan Note (Addendum)
Tolerable control on tramadol. Not sure if gabapentin helping.. will continue but does not want to increase.  occ still needs additional tramadol 1/2 tab later in day.  PDMP reviewed.. no red flags.  Ok to make next refill #45.

## 2019-08-07 NOTE — Assessment & Plan Note (Signed)
Stable control  Off NSAIDs, improved diet. No more fast food. Return to pantoprazole once daily.

## 2019-08-17 ENCOUNTER — Other Ambulatory Visit: Payer: Self-pay | Admitting: Family Medicine

## 2019-08-17 NOTE — Telephone Encounter (Signed)
Last office visit 08/07/2019 for HTN. Chronic low back pain & GERD.  Last refilled 07/27/2019 for #30 with no refills.  CPE scheduled for  09/07/2019.

## 2019-08-30 ENCOUNTER — Telehealth: Payer: Self-pay | Admitting: Family Medicine

## 2019-08-30 DIAGNOSIS — I10 Essential (primary) hypertension: Secondary | ICD-10-CM

## 2019-08-30 NOTE — Telephone Encounter (Signed)
-----   Message from Cloyd Stagers, RT sent at 08/17/2019  2:55 PM EST ----- Regarding: Lab Orders for Friday 3.19.2021 Please place lab orders for Friday 3.19.2021, office visit for physical on Friday 3.26.2021 Thank you, Dyke Maes RT(R)

## 2019-08-31 ENCOUNTER — Other Ambulatory Visit: Payer: BC Managed Care – PPO

## 2019-08-31 ENCOUNTER — Other Ambulatory Visit
Admission: RE | Admit: 2019-08-31 | Discharge: 2019-08-31 | Disposition: A | Discharge status: Home / Self Care | Payer: BC Managed Care – PPO | Attending: Family Medicine | Admitting: Family Medicine

## 2019-08-31 ENCOUNTER — Other Ambulatory Visit: Payer: Self-pay

## 2019-08-31 DIAGNOSIS — I10 Essential (primary) hypertension: Secondary | ICD-10-CM | POA: Diagnosis not present

## 2019-08-31 LAB — COMPREHENSIVE METABOLIC PANEL
ALT: 25 U/L (ref 0–44)
AST: 30 U/L (ref 15–41)
Albumin: 4.3 g/dL (ref 3.5–5.0)
Alkaline Phosphatase: 47 U/L (ref 38–126)
Anion gap: 9 (ref 5–15)
BUN: 20 mg/dL (ref 6–20)
CO2: 25 mmol/L (ref 22–32)
Calcium: 9 mg/dL (ref 8.9–10.3)
Chloride: 101 mmol/L (ref 98–111)
Creatinine, Ser: 1.04 mg/dL (ref 0.61–1.24)
GFR calc Af Amer: >60 mL/min (ref >60)
GFR calc non Af Amer: >60 mL/min (ref >60)
Glucose, Bld: 89 mg/dL (ref 70–99)
Potassium: 4.6 mmol/L (ref 3.5–5.1)
Sodium: 135 mmol/L (ref 135–145)
Total Bilirubin: 0.7 mg/dL (ref 0.3–1.2)
Total Protein: 7.4 g/dL (ref 6.5–8.1)

## 2019-08-31 LAB — LIPID PANEL
Cholesterol: 175 mg/dL (ref 0–200)
HDL: 53 mg/dL (ref >40)
LDL Cholesterol: 111 mg/dL — ABNORMAL HIGH (ref 0–99)
Total CHOL/HDL Ratio: 3.3 RATIO
Triglycerides: 55 mg/dL (ref <150)
VLDL: 11 mg/dL (ref 0–40)

## 2019-08-31 NOTE — Addendum Note (Signed)
Addended by: Memory Argue H on: 08/31/2019 01:10 PM   Modules accepted: Orders

## 2019-09-01 NOTE — Progress Notes (Signed)
No critical labs need to be addressed urgently. We will discuss labs in detail at upcoming office visit.   

## 2019-09-07 ENCOUNTER — Other Ambulatory Visit: Payer: Self-pay

## 2019-09-07 ENCOUNTER — Encounter: Payer: Self-pay | Admitting: Family Medicine

## 2019-09-07 ENCOUNTER — Encounter: Payer: BC Managed Care – PPO | Admitting: Family Medicine

## 2019-09-07 ENCOUNTER — Ambulatory Visit (INDEPENDENT_AMBULATORY_CARE_PROVIDER_SITE_OTHER): Payer: BC Managed Care – PPO | Admitting: Family Medicine

## 2019-09-07 VITALS — BP 120/72 | HR 72 | Temp 98.3°F | Ht 69.0 in | Wt 157.5 lb

## 2019-09-07 DIAGNOSIS — F418 Other specified anxiety disorders: Secondary | ICD-10-CM | POA: Diagnosis not present

## 2019-09-07 DIAGNOSIS — J4540 Moderate persistent asthma, uncomplicated: Secondary | ICD-10-CM

## 2019-09-07 DIAGNOSIS — I10 Essential (primary) hypertension: Secondary | ICD-10-CM

## 2019-09-07 DIAGNOSIS — Z0000 Encounter for general adult medical examination without abnormal findings: Secondary | ICD-10-CM

## 2019-09-07 DIAGNOSIS — N401 Enlarged prostate with lower urinary tract symptoms: Secondary | ICD-10-CM

## 2019-09-07 DIAGNOSIS — G8929 Other chronic pain: Secondary | ICD-10-CM

## 2019-09-07 DIAGNOSIS — N138 Other obstructive and reflux uropathy: Secondary | ICD-10-CM

## 2019-09-07 DIAGNOSIS — K219 Gastro-esophageal reflux disease without esophagitis: Secondary | ICD-10-CM

## 2019-09-07 DIAGNOSIS — M5440 Lumbago with sciatica, unspecified side: Secondary | ICD-10-CM

## 2019-09-07 MED ORDER — GABAPENTIN 100 MG PO CAPS: ORAL_CAPSULE | ORAL | 1 refills | Status: DC

## 2019-09-07 MED ORDER — TRAMADOL HCL 50 MG PO TABS: 50.0000 mg | ORAL_TABLET | ORAL | 0 refills | Status: DC

## 2019-09-07 NOTE — Progress Notes (Signed)
Chief Complaint  Patient presents with  . Annual Exam    History of Present Illness: HPI   The patient is here for annual wellness exam and preventative care.    Hypertension:   Good control on losartan 50 mg daily BP Readings from Last 3 Encounters:  09/07/19 120/72  08/07/19 118/74  06/12/19 114/60  Using medication without problems or lightheadedness: none Chest pain with exertion: none Edema:none Short of breath:none Average home BPs: Other issues:  Reviewed labs in detail with pt  Lab Results  Component Value Date   CHOL 175 08/31/2019   HDL 53 08/31/2019   LDLCALC 111 (H) 08/31/2019   TRIG 55 08/31/2019   CHOLHDL 3.3 08/31/2019  The 10-year ASCVD risk score Mikey Bussing DC Jr., et al., 2013) is: 3.9%   Values used to calculate the score:     Age: 55 years     Sex: Male     Is Non-Hispanic African American: No     Diabetic: No     Tobacco smoker: No     Systolic Blood Pressure: 123456 mmHg     Is BP treated: Yes     HDL Cholesterol: 53 mg/dL     Total Cholesterol: 175 mg/dL  Moderate persistent asthma:  Stable control on Symbicort and albuterol prn.  Chronic low back pain: Using tramadol  1 tab in Am and 1/2 at nightdaily. On gabapentin  200/100  PDMP reviewed and unremarkable.  GAD/situational anxiety:  Stable control on Buspar 10 mg  TID.  PHq2 0   GERD:  Improved control on protonix 40 mg daily.    This visit occurred during the SARS-CoV-2 public health emergency.  Safety protocols were in place, including screening questions prior to the visit, additional usage of staff PPE, and extensive cleaning of exam room while observing appropriate contact time as indicated for disinfecting solutions.   COVID 19 screen:  No recent travel or known exposure to COVID19 The patient denies respiratory symptoms of COVID 19 at this time. The importance of social distancing was discussed today.     Review of Systems  Constitutional: Negative for chills and fever.  HENT:  Negative for congestion and ear pain.   Eyes: Negative for pain and redness.  Respiratory: Negative for cough and shortness of breath.   Cardiovascular: Negative for chest pain, palpitations and leg swelling.  Gastrointestinal: Negative for abdominal pain, blood in stool, constipation, diarrhea, nausea and vomiting.  Genitourinary: Negative for dysuria.  Musculoskeletal: Negative for falls and myalgias.  Skin: Negative for rash.  Neurological: Negative for dizziness.  Psychiatric/Behavioral: Negative for depression. The patient is not nervous/anxious.       Past Medical History:  Diagnosis Date  . Allergic rhinitis, cause unspecified   . BPH without obstruction/lower urinary tract symptoms   . Congenital hypertrophic pyloric stenosis   . Esophageal reflux   . Heart burn   . History of kidney stones   . Hypertension   . Other chest pain   . Panic disorder without agoraphobia   . Unspecified asthma(493.90)     reports that he has never smoked. He has never used smokeless tobacco. He reports current alcohol use. He reports that he does not use drugs.   Current Outpatient Medications:  .  traMADol (ULTRAM) 50 MG tablet, Take 1 tablet (50 mg total) by mouth every morning. And 1/2 tablet by mouth in the evening, Disp: 45 tablet, Rfl: 0 .  Acetaminophen (TYLENOL 8 HOUR PO), Take by mouth., Disp: ,  Rfl:  .  albuterol (VENTOLIN HFA) 108 (90 Base) MCG/ACT inhaler, Inhale 1-2 puffs into the lungs every 6 (six) hours as needed for wheezing or shortness of breath., Disp: 3 Inhaler, Rfl: 1 .  budesonide-formoterol (SYMBICORT) 80-4.5 MCG/ACT inhaler, Inhale 2 puffs into the lungs 2 (two) times daily., Disp: 1 Inhaler, Rfl: 3 .  busPIRone (BUSPAR) 10 MG tablet, TAKE 1 TABLET BY MOUTH THREE TIMES A DAY, Disp: 270 tablet, Rfl: 1 .  Famotidine (PEPCID AC MAXIMUM STRENGTH PO), daily as needed. , Disp: , Rfl:  .  gabapentin (NEURONTIN) 100 MG capsule, Start 200 mg in AM and 100 mg  Mid-day, Disp: 90  capsule, Rfl: 1 .  loratadine (CLARITIN) 10 MG tablet, , Disp: , Rfl:  .  losartan (COZAAR) 50 MG tablet, Take 0.5 tablets (25 mg total) by mouth daily., Disp: 45 tablet, Rfl: 1 .  pantoprazole (PROTONIX) 40 MG tablet, Take 1 tablet (40 mg total) by mouth 2 (two) times daily., Disp: 90 tablet, Rfl: 1 .  Probiotic Product (PROBIOTIC DAILY PO), Take by mouth., Disp: , Rfl:  .  sucralfate (CARAFATE) 1 g tablet, Take 1 tablet (1 g total) by mouth 4 (four) times daily -  with meals and at bedtime., Disp: 30 tablet, Rfl: 1   Observations/Objective: Blood pressure 120/72, pulse 72, temperature 98.3 F (36.8 C), temperature source Temporal, height 5\' 9"  (1.753 m), weight 157 lb 8 oz (71.4 kg), SpO2 98 %.  Physical Exam Constitutional:      General: He is not in acute distress.    Appearance: Normal appearance. He is well-developed. He is not ill-appearing or toxic-appearing.  HENT:     Head: Normocephalic and atraumatic.     Right Ear: Hearing, tympanic membrane, ear canal and external ear normal.     Left Ear: Hearing, tympanic membrane, ear canal and external ear normal.     Nose: Nose normal.     Mouth/Throat:     Pharynx: Uvula midline.  Eyes:     General: Lids are normal. Lids are everted, no foreign bodies appreciated.     Conjunctiva/sclera: Conjunctivae normal.     Pupils: Pupils are equal, round, and reactive to light.  Neck:     Thyroid: No thyroid mass or thyromegaly.     Vascular: No carotid bruit.     Trachea: Trachea and phonation normal.  Cardiovascular:     Rate and Rhythm: Normal rate and regular rhythm.     Pulses: Normal pulses.     Heart sounds: S1 normal and S2 normal. No murmur. No gallop.   Pulmonary:     Breath sounds: Normal breath sounds. No wheezing, rhonchi or rales.  Abdominal:     General: Bowel sounds are normal.     Palpations: Abdomen is soft.     Tenderness: There is no abdominal tenderness. There is no guarding or rebound.     Hernia: No hernia is  present.  Musculoskeletal:     Cervical back: Normal range of motion and neck supple.  Lymphadenopathy:     Cervical: No cervical adenopathy.  Skin:    General: Skin is warm and dry.     Findings: No rash.  Neurological:     Mental Status: He is alert.     Cranial Nerves: No cranial nerve deficit.     Sensory: No sensory deficit.     Gait: Gait normal.     Deep Tendon Reflexes: Reflexes are normal and symmetric.  Psychiatric:  Speech: Speech normal.        Behavior: Behavior normal.        Judgment: Judgment normal.      Assessment and Plan The patient's preventative maintenance and recommended screening tests for an annual wellness exam were reviewed in full today. Brought up to date unless services declined.  Counselled on the importance of diet, exercise, and its role in overall health and mortality. The patient's FH and SH was reviewed, including their home life, tobacco status, and drug and alcohol status.   Vaccines:uptodate with  Td. Considering shingles vaccine. Prostate Cancer Screen:  stable 10/2018 Colon Cancer Screen:  01/2019 polyp repeat in 10 years Dr. Allen Norris.      Smoking Status: nonsmoker ETOH/ drug [id number] none HIV screen: Refused       [name], MD

## 2019-09-07 NOTE — Assessment & Plan Note (Signed)
Encouraged exercise, weight loss, healthy eating habits. Well controlled. Continue current medication.

## 2019-09-07 NOTE — Assessment & Plan Note (Addendum)
Stable control on tramadol and gabapentin.  PDMP reviewed and unremarkable.  Follow up in 6 months.

## 2019-09-07 NOTE — Patient Instructions (Signed)
Continue exercise, weight managment, healthy eating habits.

## 2019-09-07 NOTE — Assessment & Plan Note (Signed)
Stable control on symbicort daily and albuterol prn

## 2019-09-07 NOTE — Assessment & Plan Note (Signed)
Stable followed by urology 

## 2019-09-07 NOTE — Assessment & Plan Note (Signed)
Well controlled. Continue current medication.  

## 2019-09-07 NOTE — Assessment & Plan Note (Signed)
Good control on PPI once daily.

## 2019-10-16 ENCOUNTER — Other Ambulatory Visit: Payer: Self-pay | Admitting: Family Medicine

## 2019-10-16 NOTE — Telephone Encounter (Signed)
Last office visit 09/07/2019 for CPE.  Tramadol not on current medication list.  Refill?

## 2019-11-13 ENCOUNTER — Other Ambulatory Visit: Payer: Self-pay | Admitting: Family Medicine

## 2019-11-13 NOTE — Telephone Encounter (Signed)
Last office visit 09/07/2019 for CPE.  Last refilled 10/16/2019 for #45 with no refills.  Next Appt: 03/11/2020 for 6 month follow up on back pain.

## 2019-11-15 ENCOUNTER — Ambulatory Visit: Payer: BC Managed Care – PPO | Admitting: Urology

## 2019-11-19 ENCOUNTER — Ambulatory Visit: Payer: BC Managed Care – PPO | Admitting: Urology

## 2019-11-19 ENCOUNTER — Other Ambulatory Visit: Payer: Self-pay | Admitting: *Deleted

## 2019-11-19 DIAGNOSIS — N138 Other obstructive and reflux uropathy: Secondary | ICD-10-CM

## 2019-11-19 DIAGNOSIS — N401 Enlarged prostate with lower urinary tract symptoms: Secondary | ICD-10-CM

## 2019-12-13 ENCOUNTER — Other Ambulatory Visit: Payer: Self-pay | Admitting: Family Medicine

## 2019-12-14 NOTE — Telephone Encounter (Signed)
Tramadol refill Last seen 09/07/2019

## 2020-01-08 ENCOUNTER — Other Ambulatory Visit: Payer: Self-pay | Admitting: Family Medicine

## 2020-01-08 NOTE — Telephone Encounter (Signed)
Last office visit 09/07/2019 for CPE.  Last refilled 03/26/2021for #90 with 1 refill. Next Appt: 03/11/2020 for 6 month follow up.

## 2020-01-10 ENCOUNTER — Other Ambulatory Visit: Payer: Self-pay | Admitting: Family Medicine

## 2020-01-11 NOTE — Telephone Encounter (Signed)
Last office visit 09/07/2019 for CPE.  Last refilled 12/14/2019 for #45 with no refills.  Next Appt: 03/11/2020 for 3 month follow up on back pain.

## 2020-02-11 ENCOUNTER — Other Ambulatory Visit: Payer: Self-pay | Admitting: Family Medicine

## 2020-02-12 NOTE — Telephone Encounter (Signed)
Last office visit 09/07/2019 for CPE.  Tramadol not on current medication list.  Next Appt: 03/11/2020 for follow up low back pain.

## 2020-02-20 ENCOUNTER — Other Ambulatory Visit: Payer: Self-pay | Admitting: Family Medicine

## 2020-02-21 ENCOUNTER — Other Ambulatory Visit: Payer: Self-pay | Admitting: *Deleted

## 2020-02-21 MED ORDER — ALBUTEROL SULFATE HFA 108 (90 BASE) MCG/ACT IN AERS: 1.0000 | INHALATION_SPRAY | Freq: Four times a day (QID) | RESPIRATORY_TRACT | 1 refills | Status: DC | PRN

## 2020-02-21 NOTE — Telephone Encounter (Signed)
Last office visit 09/07/2019 for CPE.   Last refilled 01/09/2020 for #90 with 1 refill.  Next Appt: 03/11/2020 for follow up low back pain.

## 2020-03-11 ENCOUNTER — Encounter: Payer: Self-pay | Admitting: Family Medicine

## 2020-03-11 ENCOUNTER — Other Ambulatory Visit: Payer: Self-pay

## 2020-03-11 ENCOUNTER — Ambulatory Visit (INDEPENDENT_AMBULATORY_CARE_PROVIDER_SITE_OTHER): Payer: 59 | Admitting: Family Medicine

## 2020-03-11 VITALS — BP 130/62 | HR 72 | Temp 98.2°F | Ht 69.0 in | Wt 165.0 lb

## 2020-03-11 DIAGNOSIS — H539 Unspecified visual disturbance: Secondary | ICD-10-CM | POA: Diagnosis not present

## 2020-03-11 DIAGNOSIS — R519 Headache, unspecified: Secondary | ICD-10-CM | POA: Insufficient documentation

## 2020-03-11 DIAGNOSIS — M5440 Lumbago with sciatica, unspecified side: Secondary | ICD-10-CM

## 2020-03-11 DIAGNOSIS — R42 Dizziness and giddiness: Secondary | ICD-10-CM | POA: Diagnosis not present

## 2020-03-11 DIAGNOSIS — G8929 Other chronic pain: Secondary | ICD-10-CM

## 2020-03-11 MED ORDER — TRAMADOL HCL 50 MG PO TABS
50.0000 mg | ORAL_TABLET | ORAL | 0 refills | Status: DC
Start: 2020-03-11 — End: 2020-04-14

## 2020-03-11 NOTE — Assessment & Plan Note (Signed)
Likely multifactorial... ETD ( treat with antihistamine and increase to treatment dose flonase), Med SE ( wean off Buspar) and vision change ( eval by eye M.D)

## 2020-03-11 NOTE — Assessment & Plan Note (Signed)
PDMP reviewed during this encounter.  Stable control on gabapentin, using tramadol prn. These meds could be contributing to " swimmyheaded" feeling

## 2020-03-11 NOTE — Patient Instructions (Addendum)
Wean off buspar as able. Continue gabapentin and tramadol on current regimen.   Increase flonase 2 sprays per nostril daily.  Set up eye exam.

## 2020-03-11 NOTE — Progress Notes (Signed)
Chief Complaint  Patient presents with  . Follow-up    Back Pain  . Headache    across forehead/foggy dizzy feeling-worse when driving    History of Present Illness: HPI  54 year old male presents for follow up on chronic low back pain.  He has been using gabapentin 200/100 daily and tramadol 1 in AM and 1/2 tab in PM for pain.  Hypertension:   Good control on  Losartan 50 mg daily.  BP Readings from Last 3 Encounters:  03/11/20 130/62  09/07/19 120/72  08/07/19 118/74  Using medication without problems or lightheadedness:  See below Chest pain with exertion:none Edema:none Short of breath:none Average home BPs: Other issues:  He has alao been noting  Waking in AM with headache in frontal head... Noted off and on in last several on now almost daily.  Feels like sinus pressure.  Having post nasal drip.  No N/V.No numbness and no weakness. IN last few weeks.. feels swimmyheaded. No room spinning. Feels foggy in head. Thinks he needs a eye exam.   Possibly better on lower dose of buspar... anxiety improved control.   using claritin for allergies.  This visit occurred during the SARS-CoV-2 public health emergency.  Safety protocols were in place, including screening questions prior to the visit, additional usage of staff PPE, and extensive cleaning of exam room while observing appropriate contact time as indicated for disinfecting solutions.   COVID 19 screen:  No recent travel or known exposure to COVID19 The patient denies respiratory symptoms of COVID 19 at this time. The importance of social distancing was discussed today.     Review of Systems  Constitutional: Negative for chills and fever.  HENT: Negative for congestion and ear pain.   Eyes: Negative for pain and redness.  Respiratory: Negative for cough and shortness of breath.   Cardiovascular: Negative for chest pain, palpitations and leg swelling.  Gastrointestinal: Negative for abdominal pain, blood in  stool, constipation, diarrhea, nausea and vomiting.  Genitourinary: Negative for dysuria.  Musculoskeletal: Negative for falls and myalgias.  Skin: Negative for rash.  Neurological: Positive for dizziness and headaches. Negative for focal weakness and weakness.  Psychiatric/Behavioral: Negative for depression. The patient is not nervous/anxious.       Past Medical History:  Diagnosis Date  . Allergic rhinitis, cause unspecified   . BPH without obstruction/lower urinary tract symptoms   . Congenital hypertrophic pyloric stenosis   . Esophageal reflux   . Heart burn   . History of kidney stones   . Hypertension   . Other chest pain   . Panic disorder without agoraphobia   . Unspecified asthma(493.90)     reports that he has never smoked. He has never used smokeless tobacco. He reports current alcohol use. He reports that he does not use drugs.   Current Outpatient Medications:  .  Acetaminophen (TYLENOL 8 HOUR PO), Take by mouth., Disp: , Rfl:  .  albuterol (VENTOLIN HFA) 108 (90 Base) MCG/ACT inhaler, Inhale 1-2 puffs into the lungs every 6 (six) hours as needed for wheezing or shortness of breath., Disp: 3 each, Rfl: 1 .  budesonide-formoterol (SYMBICORT) 80-4.5 MCG/ACT inhaler, Inhale 2 puffs into the lungs 2 (two) times daily., Disp: 1 Inhaler, Rfl: 3 .  busPIRone (BUSPAR) 10 MG tablet, TAKE 1 TABLET BY MOUTH THREE TIMES A DAY, Disp: 270 tablet, Rfl: 1 .  Famotidine (PEPCID AC MAXIMUM STRENGTH PO), daily as needed. , Disp: , Rfl:  .  gabapentin (NEURONTIN) 100  MG capsule, TAKE 2 CAPSULES BY MOUTH IN THE MORNING AND 1 CAPSULE MID-DAY, Disp: 90 capsule, Rfl: 1 .  loratadine (CLARITIN) 10 MG tablet, , Disp: , Rfl:  .  losartan (COZAAR) 50 MG tablet, TAKE 1/2 TABLET BY MOUTH DAILY, Disp: 45 tablet, Rfl: 1 .  pantoprazole (PROTONIX) 40 MG tablet, TAKE 1 TABLET BY MOUTH TWICE A DAY, Disp: 180 tablet, Rfl: 1 .  Probiotic Product (PROBIOTIC DAILY PO), Take by mouth., Disp: , Rfl:  .   traMADol (ULTRAM) 50 MG tablet, TAKE 1 TABLET (50 MG TOTAL) BY MOUTH EVERY MORNING. AND 1/2 TABLET BY MOUTH IN THE EVENING, Disp: 45 tablet, Rfl: 0   Observations/Objective: Blood pressure 130/62, pulse 72, temperature 98.2 F (36.8 C), temperature source Temporal, height 5\' 9"  (1.753 m), weight 165 lb (74.8 kg), SpO2 98 %.  Physical Exam Constitutional:      Appearance: He is well-developed.  HENT:     Head: Normocephalic.     Right Ear: Hearing normal. A middle ear effusion is present.     Left Ear: Hearing normal. A middle ear effusion is present.     Nose: Nose normal.  Neck:     Thyroid: No thyroid mass or thyromegaly.     Vascular: No carotid bruit.     Trachea: Trachea normal.  Cardiovascular:     Rate and Rhythm: Normal rate and regular rhythm.     Pulses: Normal pulses.     Heart sounds: Heart sounds not distant. No murmur heard.  No friction rub. No gallop.      Comments: No peripheral edema Pulmonary:     Effort: Pulmonary effort is normal. No respiratory distress.     Breath sounds: Normal breath sounds.  Skin:    General: Skin is warm and dry.     Findings: No rash.  Neurological:     Mental Status: He is alert and oriented to person, place, and time.     Cranial Nerves: Cranial nerves are intact.     Sensory: Sensation is intact.     Motor: Motor function is intact.  Psychiatric:        Speech: Speech normal.        Behavior: Behavior normal.        Thought Content: Thought content normal.      Assessment and Plan   Chronic low back pain with sciatica PDMP reviewed during this encounter.  Stable control on gabapentin, using tramadol prn. These meds could be contributing to " swimmyheaded" feeling  Dizziness Likely multifactorial... ETD ( treat with antihistamine and increase to treatment dose flonase), Med SE ( wean off Buspar) and vision change ( eval by eye M.D)  Acute nonintractable headache Normal neuro exam.  Likely mulitfactorial.. due to med  SE, allergies, stress/tension and vision change with aging.     [name], MD

## 2020-03-11 NOTE — Assessment & Plan Note (Signed)
Normal neuro exam.  Likely mulitfactorial.. due to med SE, allergies, stress/tension and vision change with aging.

## 2020-03-14 ENCOUNTER — Other Ambulatory Visit: Payer: Self-pay

## 2020-04-14 ENCOUNTER — Other Ambulatory Visit: Payer: Self-pay | Admitting: Family Medicine

## 2020-04-14 NOTE — Telephone Encounter (Signed)
Last office visit 03/11/2020 follow up back pain/headache.  Tramadol not on current medication list.  CPE scheduled 08/26/2020.

## 2020-04-24 ENCOUNTER — Telehealth: Payer: Self-pay | Admitting: Family Medicine

## 2020-04-24 ENCOUNTER — Other Ambulatory Visit: Payer: Self-pay | Admitting: Family Medicine

## 2020-04-24 DIAGNOSIS — K219 Gastro-esophageal reflux disease without esophagitis: Secondary | ICD-10-CM

## 2020-04-24 NOTE — Telephone Encounter (Signed)
Pt says you and he discussed anoscopy for his acid reflux and it has gotten bad enough that he's ready to move forward w/that.  He had a colonoscopy at the location near [place] in [place] and liked that dr a lot and would like to schedule w/him if at all possible.  Please advise.  Thank you!

## 2020-04-24 NOTE — Telephone Encounter (Signed)
Referral sent to Dr. Allen Norris.

## 2020-05-06 ENCOUNTER — Other Ambulatory Visit: Payer: Self-pay | Admitting: Family Medicine

## 2020-05-07 ENCOUNTER — Telehealth: Payer: Self-pay | Admitting: *Deleted

## 2020-05-07 NOTE — Telephone Encounter (Signed)
Patient left a voicemail stating that he is not scheduled for his endoscopy until June 19, 2020. Patient wants to know if you will give him a refill on sucralfate because that seems to help his stomach. Last office visit  03/11/20 [facility]: [facility]

## 2020-05-09 ENCOUNTER — Other Ambulatory Visit: Payer: Self-pay | Admitting: Family Medicine

## 2020-05-12 MED ORDER — SUCRALFATE 1 GM/10ML PO SUSP: 1.0000 g | Freq: Three times a day (TID) | ORAL | 0 refills | Status: DC

## 2020-05-12 NOTE — Telephone Encounter (Signed)
Okay to refill until [facility] OV as requested.

## 2020-05-12 NOTE — Telephone Encounter (Signed)
Last office visit 03/11/2020 for chronic back pain.  Last refilled 02/21/2020 for #90 with 1 refills.  CPE scheduled for 08/26/20.

## 2020-05-12 NOTE — Telephone Encounter (Signed)
Spoke with Blake Molina.  He would prefer Liquid vs tablets of carafate.  Rx sent in as instructed by Dr. Diona Browner.  While on the phone patient is asking if Dr. Diona Browner will increase his Gabapentin for his back pain.  He states due to his stomach issues he is no longer taking any Advil.  He states he mainly needs his morning dose increased.  Please advise.

## 2020-05-13 MED ORDER — TRAMADOL HCL 50 MG PO TABS: 50.0000 mg | ORAL_TABLET | ORAL | 0 refills | Status: DC

## 2020-05-13 NOTE — Telephone Encounter (Signed)
Zenon notified as instructed by telephone.  Medication list updated.  He is also asking for refill on Tramadol.  He is asking if Dr. Diona Browner could send in a 60 or 90 day supply so he doesn't have to go to the pharmacy so often.  Last refilled 04/14/2020 for #45 with no refills.

## 2020-05-13 NOTE — Telephone Encounter (Signed)
I just refilled his gabapentin before seeing this note. He can try 1 tab TID.Marland Kitchen. if helps without SE, we can send in more.

## 2020-05-13 NOTE — Telephone Encounter (Signed)
That would be fine... as long as it does not make him groggy and sedated feeling.

## 2020-05-13 NOTE — Telephone Encounter (Signed)
Blake Molina is currently taking 2 tablets in the morning and 1 tablet at night.  Should he increase to 3 tablets in the morning??

## 2020-05-14 ENCOUNTER — Other Ambulatory Visit: Payer: Self-pay | Admitting: Family Medicine

## 2020-05-14 NOTE — Telephone Encounter (Signed)
[facility]  They states Blake Molina insurance will not cover the liquid.  [name] was the one who requested liquid.  Spoke to patient as advised him of this.  We did look at [facility] and patient can use coupon and pay out of pocket for $59.23.  Patient would like to do that.  [facility] will [facility] to let them know to go ahead and fill prescription and he will pay for it out of pocket using [employer] [facility] coupon.

## 2020-05-14 NOTE — Telephone Encounter (Signed)
Patient called in asking for call from Butch Penny as the pharmacy is still waiting for some information in regards to the sucralfate. Please advise.

## 2020-06-09 ENCOUNTER — Other Ambulatory Visit: Payer: Self-pay | Admitting: Family Medicine

## 2020-06-09 ENCOUNTER — Telehealth: Payer: Self-pay | Admitting: Family Medicine

## 2020-06-09 MED ORDER — GABAPENTIN 100 MG PO CAPS
ORAL_CAPSULE | ORAL | 0 refills | Status: DC
Start: 2020-06-09 — End: 2020-06-09

## 2020-06-09 MED ORDER — GABAPENTIN 100 MG PO CAPS
ORAL_CAPSULE | ORAL | 0 refills | Status: DC
Start: 2020-06-09 — End: 2020-07-30

## 2020-06-09 NOTE — Telephone Encounter (Signed)
Patient called you back. Please return his call EM

## 2020-06-09 NOTE — Telephone Encounter (Signed)
Spoke with Blake Molina.  He states he is taking 3 tablets in the morning and 2 in the afternoon.  He states it is working very well.  Cancelled Refill that was just sent in.  Will have Dr. Diona Browner send in new refill with new dosing instructions.

## 2020-06-09 NOTE — Telephone Encounter (Signed)
Last office visit 03/11/2020.  Patient dose was changed currently after last refill.  Now taking 3 capsules in the morning and 1 capsule mid-afternoon.  CPE scheduled for 08/26/2020.

## 2020-06-09 NOTE — Telephone Encounter (Signed)
Pt called in due to he was told by Lupita Leash to call when he is done with the Gabapentin so they up the dosage.

## 2020-06-09 NOTE — Addendum Note (Signed)
Addended by: Kerby Nora E on: 06/09/2020 04:24 PM   Modules accepted: Orders

## 2020-06-09 NOTE — Addendum Note (Signed)
Addended by: Damita Lack on: 06/09/2020 03:47 PM   Modules accepted: Orders

## 2020-06-10 NOTE — Telephone Encounter (Signed)
Last office visit 03/11/2020 for chronic low back pain.  Last refilled 05/13/2020 for #45 with no refills.  CPE scheduled for 08/26/2020.

## 2020-06-11 ENCOUNTER — Telehealth: Payer: Self-pay | Admitting: *Deleted

## 2020-06-11 NOTE — Telephone Encounter (Signed)
[facility] from [facility] requesting PA for Tramadol 50 mg.  PA completed on CoverMyMeds and sent for review.  Can take up to 72 hours for a decision.  

## 2020-06-12 NOTE — Telephone Encounter (Signed)
PA approved from 06/11/2020 through 0329/2022.  [facility] notified of approval via fax.

## 2020-06-19 ENCOUNTER — Other Ambulatory Visit: Payer: Self-pay

## 2020-06-19 ENCOUNTER — Ambulatory Visit (INDEPENDENT_AMBULATORY_CARE_PROVIDER_SITE_OTHER): Payer: 59 | Admitting: Gastroenterology

## 2020-06-19 ENCOUNTER — Encounter: Payer: Self-pay | Admitting: Gastroenterology

## 2020-06-19 VITALS — BP 137/84 | HR 75 | Temp 97.8°F | Ht 69.0 in | Wt 163.0 lb

## 2020-06-19 DIAGNOSIS — K219 Gastro-esophageal reflux disease without esophagitis: Secondary | ICD-10-CM | POA: Diagnosis not present

## 2020-06-19 NOTE — Progress Notes (Signed)
Primary Care Physician: Excell Seltzer, MD  Primary Gastroenterologist:  Dr. Midge Minium  Chief Complaint  Patient presents with  . New Patient (Initial Visit)    GERD    HPI: Blake Molina is a 55 y.o. male here for GERD.  The patient has see me in the past and had a colonoscopy with a hyperplastic polyp that time. He reports that he is now on Protonix twice a day and famotidine twice a day with continued acid reflux but he states it is much better.  He also reports that there is some dysphagia very infrequently with bulky foods.  There is no report of any unexplained weight loss fevers chills black stools or bloody stools.  Past Medical History:  Diagnosis Date  . Allergic rhinitis, cause unspecified   . BPH without obstruction/lower urinary tract symptoms   . Congenital hypertrophic pyloric stenosis   . Esophageal reflux   . Heart burn   . History of kidney stones   . Hypertension   . Other chest pain   . Panic disorder without agoraphobia   . Unspecified asthma(493.90)     Current Outpatient Medications  Medication Sig Dispense Refill  . Acetaminophen (TYLENOL 8 HOUR PO) Take by mouth.    Marland Kitchen albuterol (VENTOLIN HFA) 108 (90 Base) MCG/ACT inhaler Inhale 1-2 puffs into the lungs every 6 (six) hours as needed for wheezing or shortness of breath. 3 each 1  . budesonide-formoterol (SYMBICORT) 80-4.5 MCG/ACT inhaler Inhale 2 puffs into the lungs 2 (two) times daily. 1 Inhaler 3  . busPIRone (BUSPAR) 10 MG tablet TAKE 1 TABLET BY MOUTH THREE TIMES A DAY 270 tablet 1  . Famotidine (PEPCID AC MAXIMUM STRENGTH PO) 1 tablet 2 (two) times daily.    . fexofenadine (ALLEGRA) 180 MG tablet Take 180 mg by mouth daily.    Marland Kitchen gabapentin (NEURONTIN) 100 MG capsule Take 3 tablets by mouth every morning and 2 tablets by mouth every mid-afternoon. 150 capsule 0  . losartan (COZAAR) 50 MG tablet TAKE 1/2 TABLET BY MOUTH DAILY 45 tablet 1  . pantoprazole (PROTONIX) 40 MG tablet TAKE 1 TABLET BY  MOUTH TWICE A DAY 180 tablet 1  . sucralfate (CARAFATE) 1 GM/10ML suspension Take 10 mLs (1 g total) by mouth 4 (four) times daily -  with meals and at bedtime. 420 mL 0  . traMADol (ULTRAM) 50 MG tablet TAKE 1 TABLET (50 MG TOTAL) BY MOUTH EVERY MORNING. AND 1/2 TABLET BY MOUTH IN THE EVENING 45 tablet 0   No current facility-administered medications for this visit.    Allergies as of 06/19/2020  . (No Known Allergies)    ROS:  General: Negative for anorexia, weight loss, fever, chills, fatigue, weakness. ENT: Negative for hoarseness, difficulty swallowing , nasal congestion. CV: Negative for chest pain, angina, palpitations, dyspnea on exertion, peripheral edema.  Respiratory: Negative for dyspnea at rest, dyspnea on exertion, cough, sputum, wheezing.  GI: See history of present illness. GU:  Negative for dysuria, hematuria, urinary incontinence, urinary frequency, nocturnal urination.  Endo: Negative for unusual weight change.    Physical Examination:   BP 137/84   Pulse 75   Temp 97.8 F (36.6 C) (Temporal)   Ht 5\' 9"  (1.753 m)   Wt 163 lb (73.9 kg)   BMI 24.07 kg/m   General: Well-nourished, well-developed in no acute distress.  Eyes: No icterus. Conjunctivae pink. Lungs: Clear to auscultation bilaterally. Non-labored. Heart: Regular rate and rhythm, no murmurs rubs or  gallops.  Abdomen: Bowel sounds are normal, nontender, nondistended, no hepatosplenomegaly or masses, no abdominal bruits or hernia , no rebound or guarding.   Extremities: No lower extremity edema. No clubbing or deformities. Neuro: Alert and oriented x 3.  Grossly intact. Skin: Warm and dry, no jaundice.   Psych: Alert and cooperative, normal mood and affect.  Labs:    Imaging Studies: No results found.  Assessment and Plan:   Blake Molina is a 55 y.o. y/o male who comes in today with a history of reflux with what he reports his an acid taste in his mouth. Although he reports the symptoms much  better and also related to the foods he eats he states that he still has acid breakthrough.  The patient will be set up for an upper endoscopy and pending the results of that may need to be switched to a different PPI such as Dexilant.  The patient has been told that since he has had long-standing heartburn with continued acid reflux a upper endoscopy should be done to rule out Barrett's esophagus or a hiatal hernia.  He also has been told that he should consider antireflux surgery to avoid long-term acid suppression use and its side effects.  The patient has been explained the plan and agrees with it.     Midge Minium, MD. Clementeen Graham    Note: This dictation was prepared with Dragon dictation along with smaller phrase technology. Any transcriptional errors that result from this process are unintentional.

## 2020-06-20 ENCOUNTER — Other Ambulatory Visit: Payer: Self-pay

## 2020-06-20 ENCOUNTER — Encounter: Payer: Self-pay | Admitting: Gastroenterology

## 2020-06-20 DIAGNOSIS — K219 Gastro-esophageal reflux disease without esophagitis: Secondary | ICD-10-CM

## 2020-06-23 NOTE — Discharge Instructions (Signed)

## 2020-06-24 ENCOUNTER — Other Ambulatory Visit
Admission: RE | Admit: 2020-06-24 | Discharge: 2020-06-24 | Disposition: A | Discharge status: Home / Self Care | Payer: 59 | Source: Ambulatory Visit | Attending: Gastroenterology | Admitting: Gastroenterology

## 2020-06-24 ENCOUNTER — Other Ambulatory Visit: Payer: Self-pay

## 2020-06-24 DIAGNOSIS — Z01812 Encounter for preprocedural laboratory examination: Secondary | ICD-10-CM | POA: Diagnosis present

## 2020-06-24 DIAGNOSIS — Z20822 Contact with and (suspected) exposure to covid-19: Secondary | ICD-10-CM | POA: Insufficient documentation

## 2020-06-24 LAB — SARS CORONAVIRUS 2 (TAT 6-24 HRS): SARS Coronavirus 2: NEGATIVE

## 2020-06-26 ENCOUNTER — Ambulatory Visit
Admission: RE | Admit: 2020-06-26 | Discharge: 2020-06-26 | Disposition: A | Discharge status: Home / Self Care | Payer: 59 | Attending: Gastroenterology | Admitting: Gastroenterology

## 2020-06-26 ENCOUNTER — Encounter: Payer: Self-pay | Admitting: Gastroenterology

## 2020-06-26 ENCOUNTER — Ambulatory Visit: Payer: 59 | Admitting: Anesthesiology

## 2020-06-26 ENCOUNTER — Encounter
Admission: RE | Disposition: A | Discharge status: Home / Self Care | Payer: Self-pay | Source: Home / Self Care | Attending: Gastroenterology

## 2020-06-26 DIAGNOSIS — Z7951 Long term (current) use of inhaled steroids: Secondary | ICD-10-CM | POA: Diagnosis not present

## 2020-06-26 DIAGNOSIS — Z79899 Other long term (current) drug therapy: Secondary | ICD-10-CM | POA: Diagnosis not present

## 2020-06-26 DIAGNOSIS — K2289 Other specified disease of esophagus: Secondary | ICD-10-CM | POA: Insufficient documentation

## 2020-06-26 DIAGNOSIS — K219 Gastro-esophageal reflux disease without esophagitis: Secondary | ICD-10-CM | POA: Insufficient documentation

## 2020-06-26 HISTORY — DX: Headache, unspecified: R51.9

## 2020-06-26 HISTORY — PX: ESOPHAGOGASTRODUODENOSCOPY (EGD) WITH PROPOFOL: SHX5813

## 2020-06-26 HISTORY — DX: Unspecified asthma, uncomplicated: J45.909

## 2020-06-26 SURGERY — ESOPHAGOGASTRODUODENOSCOPY (EGD) WITH PROPOFOL
Anesthesia: General

## 2020-06-26 MED ORDER — PROPOFOL 10 MG/ML IV BOLUS
INTRAVENOUS | Status: DC | PRN
  Administered 2020-06-26: 40 mg via INTRAVENOUS
  Administered 2020-06-26: 150 mg via INTRAVENOUS

## 2020-06-26 MED ORDER — LIDOCAINE HCL (CARDIAC) PF 100 MG/5ML IV SOSY
PREFILLED_SYRINGE | INTRAVENOUS | Status: DC | PRN
  Administered 2020-06-26: 30 mg via INTRAVENOUS

## 2020-06-26 MED ORDER — LACTATED RINGERS IV SOLN: INTRAVENOUS | Status: DC

## 2020-06-26 MED ORDER — ACETAMINOPHEN 160 MG/5ML PO SOLN: 325.0000 mg | Freq: Once | ORAL | Status: DC

## 2020-06-26 MED ORDER — SODIUM CHLORIDE 0.9 % IV SOLN: INTRAVENOUS | Status: DC

## 2020-06-26 MED ORDER — ACETAMINOPHEN 325 MG PO TABS: 325.0000 mg | ORAL_TABLET | Freq: Once | ORAL | Status: DC

## 2020-06-26 SURGICAL SUPPLY — 34 items
BALLN DILATOR 10-12 8 (BALLOONS)
BALLN DILATOR 12-15 8 (BALLOONS)
BALLN DILATOR 15-18 8 (BALLOONS)
BALLN DILATOR CRE 0-12 8 (BALLOONS)
BALLN DILATOR ESOPH 8 10 CRE (MISCELLANEOUS) IMPLANT
BALLOON DILATOR 12-15 8 (BALLOONS) IMPLANT
BALLOON DILATOR 15-18 8 (BALLOONS) IMPLANT
BALLOON DILATOR CRE 0-12 8 (BALLOONS) IMPLANT
BLOCK BITE 60FR ADLT L/F GRN (MISCELLANEOUS) ×2 IMPLANT
CLIP HMST 235XBRD CATH ROT (MISCELLANEOUS) IMPLANT
CLIP RESOLUTION 360 11X235 (MISCELLANEOUS)
ELECT REM PT RETURN 9FT ADLT (ELECTROSURGICAL)
ELECTRODE REM PT RTRN 9FT ADLT (ELECTROSURGICAL) IMPLANT
FCP ESCP3.2XJMB 240X2.8X (MISCELLANEOUS)
FORCEPS BIOP RAD 4 LRG CAP 4 (CUTTING FORCEPS) IMPLANT
FORCEPS BIOP RJ4 240 W/NDL (MISCELLANEOUS)
FORCEPS ESCP3.2XJMB 240X2.8X (MISCELLANEOUS) IMPLANT
GOWN CVR UNV OPN BCK APRN NK (MISCELLANEOUS) ×2 IMPLANT
GOWN ISOL THUMB LOOP REG UNIV (MISCELLANEOUS) ×4
INJECTOR VARIJECT VIN23 (MISCELLANEOUS) IMPLANT
KIT DEFENDO VALVE AND CONN (KITS) IMPLANT
KIT PRC NS LF DISP ENDO (KITS) ×1 IMPLANT
KIT PROCEDURE OLYMPUS (KITS) ×2
MANIFOLD NEPTUNE II (INSTRUMENTS) ×2 IMPLANT
MARKER SPOT ENDO TATTOO 5ML (MISCELLANEOUS) IMPLANT
RETRIEVER NET PLAT FOOD (MISCELLANEOUS) IMPLANT
SNARE SHORT THROW 13M SML OVAL (MISCELLANEOUS) IMPLANT
SNARE SHORT THROW 30M LRG OVAL (MISCELLANEOUS) IMPLANT
SPOT EX ENDOSCOPIC TATTOO (MISCELLANEOUS)
SYR INFLATION 60ML (SYRINGE) IMPLANT
TRAP ETRAP POLY (MISCELLANEOUS) IMPLANT
VARIJECT INJECTOR VIN23 (MISCELLANEOUS)
WATER STERILE IRR 250ML POUR (IV SOLUTION) ×2 IMPLANT
WIRE CRE 18-20MM 8CM F G (MISCELLANEOUS) IMPLANT

## 2020-06-26 NOTE — Anesthesia Procedure Notes (Signed)
Date/Time: 06/26/2020 10:16 AM Performed by: Cameron Ali, CRNA Pre-anesthesia Checklist: Patient identified, Emergency Drugs available, Suction available, Timeout performed and Patient being monitored Patient Re-evaluated:Patient Re-evaluated prior to induction Oxygen Delivery Method: Nasal cannula Placement Confirmation: positive ETCO2

## 2020-06-26 NOTE — Anesthesia Preprocedure Evaluation (Signed)
Anesthesia Evaluation  Patient identified by MRN, date of birth, ID band Patient awake    Reviewed: Allergy & Precautions, H&P , NPO status , Patient's Chart, lab work & pertinent test results  Airway Mallampati: II  TM Distance: >3 FB Neck ROM: full    Dental no notable dental hx. (+) Chipped   Pulmonary    Pulmonary exam normal breath sounds clear to auscultation       Cardiovascular hypertension, Normal cardiovascular exam Rhythm:regular Rate:Normal     Neuro/Psych    GI/Hepatic GERD  ,  Endo/Other    Renal/GU      Musculoskeletal   Abdominal   Peds  Hematology   Anesthesia Other Findings   Reproductive/Obstetrics                             Anesthesia Physical Anesthesia Plan  ASA: II  Anesthesia Plan: General   Post-op Pain Management:    Induction: Intravenous  PONV Risk Score and Plan: 2 and Treatment may vary due to age or medical condition, TIVA and Propofol infusion  Airway Management Planned: Natural Airway  Additional Equipment:   Intra-op Plan:   Post-operative Plan:   Informed Consent: I have reviewed the patients History and Physical, chart, labs and discussed the procedure including the risks, benefits and alternatives for the proposed anesthesia with the patient or authorized representative who has indicated his/her understanding and acceptance.     Dental Advisory Given  Plan Discussed with: CRNA  Anesthesia Plan Comments:         Anesthesia Quick Evaluation

## 2020-06-26 NOTE — Transfer of Care (Signed)
Immediate Anesthesia Transfer of Care Note  Patient: Blake Molina  Procedure(s) Performed: ESOPHAGOGASTRODUODENOSCOPY (EGD) WITH PROPOFOL (N/A )  Patient Location: PACU  Anesthesia Type: General  Level of Consciousness: awake, alert  and patient cooperative  Airway and Oxygen Therapy: Patient Spontanous Breathing and Patient connected to supplemental oxygen  Post-op Assessment: Post-op Vital signs reviewed, Patient's Cardiovascular Status Stable, Respiratory Function Stable, Patent Airway and No signs of Nausea or vomiting  Post-op Vital Signs: Reviewed and stable  Complications: No complications documented.

## 2020-06-26 NOTE — Op Note (Signed)
[facility] Gastroenterology Patient Name: Blake Molina Procedure Date: 06/26/2020 10:12 AM MRN: [id number] Account #: [id number] Date of Birth: [birth year] Admit Type: Outpatient Age: 55 Room: [facility] OR ROOM 01 Gender: Male Note Status: Finalized Procedure:             Upper GI endoscopy Indications:           Heartburn Providers:             Lucilla Lame MD, MD Referring MD:          Jinny Sanders MD, MD (Referring MD) Medicines:             Propofol per Anesthesia Complications:         No immediate complications. Procedure:             Pre-Anesthesia Assessment:                        - Prior to the procedure, a History and Physical was                         performed, and patient medications and allergies were                         reviewed. The patient's tolerance of previous                         anesthesia was also reviewed. The risks and benefits                         of the procedure and the sedation options and risks                         were discussed with the patient. All questions were                         answered, and informed consent was obtained. Prior                         Anticoagulants: The patient has taken no previous                         anticoagulant or antiplatelet agents. ASA Grade                         Assessment: II - A patient with mild systemic disease.                         After reviewing the risks and benefits, the patient                         was deemed in satisfactory condition to undergo the                         procedure.                        After obtaining informed consent, the endoscope was  passed under direct vision. Throughout the procedure,                         the patient's blood pressure, pulse, and oxygen                         saturations were monitored continuously. The                         Endosonoscope was introduced through the mouth, and                          advanced to the second part of duodenum. The upper GI                         endoscopy was accomplished without difficulty. The                         patient tolerated the procedure well. Findings:      The Z-line was irregular.      The stomach was normal.      The examined duodenum was normal. Impression:            - Z-line irregular.                        - Normal stomach.                        - Normal examined duodenum.                        - No specimens collected. Recommendation:        - Discharge patient to home.                        - Resume previous diet.                        - Continue present medications. Procedure Code(s):     --- Professional ---                        [phone], Esophagogastroduodenoscopy, flexible,                         transoral; diagnostic, including collection of                         specimen(s) by brushing or washing, when performed                         (separate procedure) Diagnosis Code(s):     --- Professional ---                        R12, Heartburn CPT copyright 2019 American Medical Association. All rights reserved. The codes documented in this report are preliminary and upon coder review may  be revised to meet current compliance requirements. Lucilla Lame MD, MD 06/26/2020 10:23:56 AM This report has been signed electronically. Number of Addenda: 0 Note Initiated On: 06/26/2020 10:12 AM Total Procedure Duration: 0 hours 2 minutes 30 seconds  Estimated Blood Loss:  Estimated blood loss: none.      [facility]

## 2020-06-26 NOTE — H&P (Signed)
Lucilla Lame, MD [address] [place], [place] [address] [phone] Fax : [phone]  Primary Care Physician:  Jinny Sanders, MD Primary Gastroenterologist:  Dr. Allen Norris  Pre-Procedure History & Physical: HPI:  Blake Molina is a 55 y.o. male is here for an endoscopy.   Past Medical History:  Diagnosis Date  . Allergic rhinitis, cause unspecified   . Asthma   . BPH without obstruction/lower urinary tract symptoms   . Congenital hypertrophic pyloric stenosis   . Esophageal reflux   . Headache    sinus  . Heart burn   . History of kidney stones   . Hypertension   . Other chest pain   . Panic disorder without agoraphobia   . Unspecified asthma(493.90)     Past Surgical History:  Procedure Laterality Date  . COLONOSCOPY WITH PROPOFOL N/A 01/22/2019   Procedure: COLONOSCOPY WITH PROPOFOL;  Surgeon: Lucilla Lame, MD;  Location: [place];  Service: Endoscopy;  Laterality: N/A;  . herniated disc-back surgery  1996  . POLYPECTOMY  01/22/2019   Procedure: POLYPECTOMY;  Surgeon: Lucilla Lame, MD;  Location: [place];  Service: Endoscopy;;  . Stenosis surgery (other)  1960s   hx of pyloric stenosis as infant    Prior to Admission medications   Medication Sig Start Date End Date Taking? Authorizing Provider  Acetaminophen (TYLENOL 8 HOUR PO) Take by mouth.   Yes [provider]  albuterol (VENTOLIN HFA) 108 (90 Base) MCG/ACT inhaler Inhale 1-2 puffs into the lungs every 6 (six) hours as needed for wheezing or shortness of breath. 02/21/20  Yes Bedsole, Amy E, MD  budesonide-formoterol (SYMBICORT) 80-4.5 MCG/ACT inhaler Inhale 2 puffs into the lungs 2 (two) times daily. 08/31/18  Yes Bedsole, Amy E, MD  busPIRone (BUSPAR) 10 MG tablet TAKE 1 TABLET BY MOUTH THREE TIMES A DAY Patient taking differently: 2 (two) times daily. 08/06/19  Yes Bedsole, Amy E, MD  Famotidine (PEPCID AC MAXIMUM STRENGTH PO) 1 tablet 2 (two) times daily.  09/07/18  Yes [provider]  fexofenadine (ALLEGRA) 180 MG tablet Take 180 mg by mouth daily.   Yes [provider]  gabapentin (NEURONTIN) 100 MG capsule Take 3 tablets by mouth every morning and 2 tablets by mouth every mid-afternoon. 06/09/20  Yes Bedsole, Amy E, MD  losartan (COZAAR) 50 MG tablet TAKE 1/2 TABLET BY MOUTH DAILY 01/11/20  Yes Bedsole, Amy E, MD  pantoprazole (PROTONIX) 40 MG tablet TAKE 1 TABLET BY MOUTH TWICE A DAY 01/11/20  Yes Bedsole, Amy E, MD  traMADol (ULTRAM) 50 MG tablet TAKE 1 TABLET (50 MG TOTAL) BY MOUTH EVERY MORNING. AND 1/2 TABLET BY MOUTH IN THE EVENING 06/11/20 07/11/20 Yes Bedsole, Amy E, MD  sucralfate (CARAFATE) 1 GM/10ML suspension Take 10 mLs (1 g total) by mouth 4 (four) times daily -  with meals and at bedtime. Patient not taking: Reported on 06/20/2020 05/12/20   Jinny Sanders, MD    Allergies as of 06/20/2020  . (No Known Allergies)    Family History  Problem Relation Age of Onset  . Coronary artery disease Father        stent age 70  . Hypertension Father   . Allergies Mother   . Asthma Brother   . Allergies Brother   . Hypertension Paternal Grandfather   . Kidney disease Neg Hx   . Prostate cancer Neg Hx   . Kidney cancer Neg Hx   . Bladder Cancer Neg Hx  Social History   Socioeconomic History  . Marital status: Married    Spouse name: Not on file  . Number of children: 3  . Years of education: Not on file  . Highest education level: Not on file  Occupational History  . Occupation: Museum/gallery curator  Tobacco Use  . Smoking status: Never Smoker  . Smokeless tobacco: Never Used  Vaping Use  . Vaping Use: Never used  Substance and Sexual Activity  . Alcohol use: Yes    Comment: Occasionally, maybe 2x/month   . Drug use: No  . Sexual activity: Not on file  Other Topics Concern  . Not on file  Social History Narrative   Regularly exercises- walks at work, elliptical. 3 healthy children. Occupation  Museum/gallery curator. Diet: 2 meals, fruits/veggies., 1-2x/week FF         Social Determinants of Health   Financial Resource Strain: Not on file  Food Insecurity: Not on file  Transportation Needs: Not on file  Physical Activity: Not on file  Stress: Not on file  Social Connections: Not on file  Intimate Partner Violence: Not on file    Review of Systems: See HPI, otherwise negative ROS  Physical Exam: BP (!) 118/99   Pulse 84   Temp 98.4 F (36.9 C) (Temporal)   Resp 16   Ht 5\' 11"  (1.803 m)   Wt 73.3 kg   SpO2 100%   BMI 22.54 kg/m  General:   Alert,  pleasant and cooperative in NAD Head:  Normocephalic and atraumatic. Neck:  Supple; no masses or thyromegaly. Lungs:  Clear throughout to auscultation.    Heart:  Regular rate and rhythm. Abdomen:  Soft, nontender and nondistended. Normal bowel sounds, without guarding, and without rebound.   Neurologic:  Alert and  oriented x4;  grossly normal neurologically.  Impression/Plan: Blake Molina is here for an endoscopy to be performed for GERD  Risks, benefits, limitations, and alternatives regarding  endoscopy have been reviewed with the patient.  Questions have been answered.  All parties agreeable.   Lucilla Lame, MD  06/26/2020, 9:28 AM

## 2020-06-26 NOTE — Anesthesia Postprocedure Evaluation (Signed)
Anesthesia Post Note  Patient: Blake Molina  Procedure(s) Performed: ESOPHAGOGASTRODUODENOSCOPY (EGD) WITH PROPOFOL (N/A )     Patient location during evaluation: PACU Anesthesia Type: General Level of consciousness: awake and alert and oriented Pain management: satisfactory to patient Vital Signs Assessment: post-procedure vital signs reviewed and stable Respiratory status: spontaneous breathing, nonlabored ventilation and respiratory function stable Cardiovascular status: blood pressure returned to baseline and stable Postop Assessment: Adequate PO intake and No signs of nausea or vomiting Anesthetic complications: no   No complications documented.  Raliegh Ip

## 2020-06-27 ENCOUNTER — Encounter: Payer: Self-pay | Admitting: Gastroenterology

## 2020-06-27 NOTE — Telephone Encounter (Signed)
Error

## 2020-07-03 ENCOUNTER — Other Ambulatory Visit: Payer: Self-pay | Admitting: Family Medicine

## 2020-07-11 ENCOUNTER — Other Ambulatory Visit: Payer: Self-pay | Admitting: Family Medicine

## 2020-07-11 NOTE — Telephone Encounter (Signed)
Last office visit 03/11/2020 for chronic back pain.  Last refilled 06/11/2020 for #45 with no refills.  CPE scheduled for 08/26/2020.

## 2020-07-18 ENCOUNTER — Other Ambulatory Visit: Payer: Self-pay | Admitting: Family Medicine

## 2020-07-21 ENCOUNTER — Other Ambulatory Visit: Payer: Self-pay

## 2020-07-21 ENCOUNTER — Telehealth: Payer: Self-pay | Admitting: Gastroenterology

## 2020-07-21 MED ORDER — DEXLANSOPRAZOLE 60 MG PO CPDR: 60.0000 mg | DELAYED_RELEASE_CAPSULE | Freq: Every day | ORAL | 3 refills | Status: DC

## 2020-07-21 NOTE — Telephone Encounter (Signed)
Dexilant (samples) he feels much better  [facility] [place]  90 day refill

## 2020-07-22 NOTE — Telephone Encounter (Signed)
Prescription for Dexilant has been sent to pt's pharmacy. Pt notified.

## 2020-07-30 ENCOUNTER — Encounter: Payer: Self-pay | Admitting: Gastroenterology

## 2020-07-30 ENCOUNTER — Other Ambulatory Visit: Payer: Self-pay | Admitting: Family Medicine

## 2020-07-30 ENCOUNTER — Telehealth: Payer: Self-pay | Admitting: Gastroenterology

## 2020-07-30 NOTE — Telephone Encounter (Signed)
Patient called asking for more Dexilant samples, please advise

## 2020-07-30 NOTE — Telephone Encounter (Signed)
Last office visit 03/11/2020 for follow up back pain & headache.  Last refilled 06/09/2020 for #150 with no refills.  CPE scheduled for 08/26/2020.

## 2020-07-31 ENCOUNTER — Other Ambulatory Visit: Payer: Self-pay | Admitting: Gastroenterology

## 2020-07-31 NOTE — Telephone Encounter (Signed)
Contacted pt and advised we do not have samples of Dexilant at this time, but the rx for Dexilant had been sent to his pharmacy on 07/21/20.

## 2020-07-31 NOTE — Telephone Encounter (Signed)
error 

## 2020-08-02 ENCOUNTER — Other Ambulatory Visit: Payer: Self-pay | Admitting: Family Medicine

## 2020-08-11 ENCOUNTER — Ambulatory Visit
Admission: EM | Admit: 2020-08-11 | Discharge: 2020-08-11 | Disposition: A | Discharge status: Home / Self Care | Payer: 59 | Attending: Sports Medicine | Admitting: Sports Medicine

## 2020-08-11 ENCOUNTER — Other Ambulatory Visit: Payer: Self-pay

## 2020-08-11 ENCOUNTER — Telehealth: Payer: Self-pay

## 2020-08-11 ENCOUNTER — Encounter: Payer: Self-pay | Admitting: Emergency Medicine

## 2020-08-11 DIAGNOSIS — R42 Dizziness and giddiness: Secondary | ICD-10-CM | POA: Diagnosis not present

## 2020-08-11 DIAGNOSIS — F411 Generalized anxiety disorder: Secondary | ICD-10-CM | POA: Diagnosis not present

## 2020-08-11 DIAGNOSIS — R519 Headache, unspecified: Secondary | ICD-10-CM

## 2020-08-11 NOTE — Telephone Encounter (Signed)
[name] - Client TELEPHONE ADVICE RECORD [facility] Patient Name: Blake Molina Gender: Male DOB: Nov 16, 1965 Age: 55 Y 2 M 26 D Return Phone Number: [phone] (Primary) Address: City/State/Zip: [place] [address] Client [facility] - Client Client Site [place] - Day Physician [name] - MD Contact Type Call Who Is Calling Patient / Member / Family / Caregiver Call Type Triage / Clinical Relationship To Patient Self Return Phone Number [phone] (Primary) Chief Complaint Dizziness Reason for Call Symptomatic / Request for [place] states he is experiencing a headache with dizziness. Translation No Nurse Assessment Nurse: Zorita Pang, RN, Deborah Date/Time (Eastern Time): 08/11/2020 10:41:16 AM Confirm and document reason for call. If symptomatic, describe symptoms. ---The caller states that he has a headache that goes down into his nose. He has a foggy dizzy feeling. Not like he is going to fall. Has a runny nose. Has to keep clearing his throat. Does the patient have any new or worsening symptoms? ---Yes Will a triage be completed? ---Yes Related visit to physician within the last 2 weeks? ---No Does the PT have any chronic conditions? (i.e. diabetes, asthma, this includes High risk factors for pregnancy, etc.) ---Yes List chronic conditions. ---anxiety (mild) Is this a behavioral health or substance abuse call? ---No Guidelines Guideline Title Affirmed Question Affirmed Notes Nurse Date/Time (Eastern Time) Headache [1] MILD-MODERATE headache AND [2] present > 72 hours Womble, RN, Neoma Laming 08/11/2020 10:44:49 AM Disp. Time Eilene Ghazi Time) Disposition Final User 08/11/2020 10:49:52 AM SEE PCP WITHIN 3 DAYS Yes Womble, RN, Garrel Ridgel Disagree/Comply Comply Caller Understands Yes PLEASE NOTE: All timestamps contained within this report are represented  as [place] Standard Time. CONFIDENTIALTY NOTICE: This fax transmission is intended only for the addressee. It contains information that is legally privileged, confidential or otherwise protected from use or disclosure. If you are not the intended recipient, you are strictly prohibited from reviewing, disclosing, copying using or disseminating any of this information or taking any action in reliance on or regarding this information. If you have received this fax in error, please notify us immediately by telephone so that we can arrange for its return to [place]. Phone: [phone], Toll-Free: [phone], Fax: [phone] Page: 2 of 2 Call Id: [id number] PreDisposition Call Doctor Care Advice Given Per Guideline SEE PCP WITHIN 3 DAYS: * Apply a cold wet washcloth or cold pack to the forehead for 20 minutes.

## 2020-08-11 NOTE — Telephone Encounter (Signed)
Patient has an appointment with Dr. Diona Browner on 3/3.

## 2020-08-11 NOTE — ED Triage Notes (Addendum)
Patient in today c/o dizziness x 1-2 weeks. Patient states he feels like he is in a fog. Patient also c/o headache off & on x 2-3 months, maybe longer. Patient also c/o having to clear his throat for a few months. Patient states it is worse at night or in the morning.  Patient has appointment with PCP on Thursday, Mar 3.

## 2020-08-12 NOTE — Telephone Encounter (Signed)
Noted  

## 2020-08-13 NOTE — Discharge Instructions (Addendum)
Patient's blood pressure was elevated in the office today.  He is on losartan only according to his medication list.  His morning headaches may be due to hypertension.  He does note that he snores as well so he may have a component of sleep apnea.  I have encouraged him to discuss these conditions with his primary care physician at his appointment later this week.  He may benefit from a sleep study. He is not having symptoms suggestive of vertigo so no new medications were given. Educational handouts were provided. Supportive care, plenty of fluids and plenty of rest. Offered a work note he deferred. Follow-up here as needed.

## 2020-08-13 NOTE — ED Provider Notes (Signed)
[facility]    CSN: [id number] Arrival date & time: 08/11/20  1258      History   Chief Complaint Chief Complaint  Patient presents with  . Dizziness  . clearing throat  . Headache    HPI Blake Molina is a 55 y.o. male.   Patient pleasant 55 year old male who presents for evaluation of the above issue.  Normally sees low Exie Parody for his ongoing primary care needs.  Patient reports having a headache now for 2 to 3 months.  It is worse in the morning.  He did see his primary care physician and had his allergy meds changed around but that has not seemed to help his situation.  He reports 2 weeks now of feeling lightheaded at times.  No presyncopal or syncopal episodes noted.  Says he is clearing his throat a lot but he denies any upper respiratory infection and no cough.  No sinus drainage.  He denies chest pain shortness of breath or Covid-like symptoms.  He has had no COVID exposure.  He has no COVID history.  He has not been vaccinated and has not had the flu shot.  He reports that he is been told he snores but has not had a sleep study recently.  He says he feels like he is in a fog but is just lightheadedness but no significant dizziness.  He denies any vision changes numbness or tingling.  He does have a history of panic disorder but does not take any medication for this.  No red flag signs or symptoms elicited on history.     Past Medical History:  Diagnosis Date  . Allergic rhinitis, cause unspecified   . Asthma   . BPH without obstruction/lower urinary tract symptoms   . Congenital hypertrophic pyloric stenosis   . Esophageal reflux   . Headache    sinus  . Heart burn   . History of kidney stones   . Hypertension   . Other chest pain   . Panic disorder without agoraphobia   . Unspecified asthma(493.90)     Patient Active Problem List   Diagnosis Date Noted  . Acute nonintractable headache 03/11/2020  . Change in vision 03/11/2020  . Dizziness  03/11/2020  . Bilateral knee pain 06/12/2019  . Special screening for malignant neoplasms, colon   . Polyp of sigmoid colon   . Situational anxiety 08/31/2018  . Hypertension, essential, benign 08/15/2018  . Moderate persistent allergic asthma 03/02/2017  . Chronic low back pain with sciatica 03/02/2017  . Left ureteral stone 08/07/2015  . Hydronephrosis, left 08/07/2015  . BPH with obstruction/lower urinary tract symptoms 08/07/2015  . Allergic sinusitis 02/03/2007  . Gastroesophageal reflux disease 01/11/2007    Past Surgical History:  Procedure Laterality Date  . COLONOSCOPY WITH PROPOFOL N/A 01/22/2019   Procedure: COLONOSCOPY WITH PROPOFOL;  Surgeon: Lucilla Lame, MD;  Location: [facility];  Service: Endoscopy;  Laterality: N/A;  . ESOPHAGOGASTRODUODENOSCOPY (EGD) WITH PROPOFOL N/A 06/26/2020   Procedure: ESOPHAGOGASTRODUODENOSCOPY (EGD) WITH PROPOFOL;  Surgeon: Lucilla Lame, MD;  Location: [facility];  Service: Endoscopy;  Laterality: N/A;  . herniated disc-back surgery  1996  . POLYPECTOMY  01/22/2019   Procedure: POLYPECTOMY;  Surgeon: Lucilla Lame, MD;  Location: [facility];  Service: Endoscopy;;  . Stenosis surgery (other)  1960s   hx of pyloric stenosis as infant       Home Medications    Prior to Admission medications   Medication Sig Start Date End  Date Taking? Authorizing Provider  Acetaminophen (TYLENOL 8 HOUR PO) Take by mouth.   Yes [provider]  albuterol (VENTOLIN HFA) 108 (90 Base) MCG/ACT inhaler Inhale 1-2 puffs into the lungs every 6 (six) hours as needed for wheezing or shortness of breath. 02/21/20  Yes Bedsole, Amy E, MD  budesonide-formoterol (SYMBICORT) 80-4.5 MCG/ACT inhaler Inhale 2 puffs into the lungs 2 (two) times daily. 08/31/18  Yes Bedsole, Amy E, MD  busPIRone (BUSPAR) 10 MG tablet TAKE 1 TABLET BY MOUTH THREE TIMES A DAY 07/03/20  Yes Bedsole, Amy E, MD  dexlansoprazole (DEXILANT) 60 MG capsule Take 1 capsule  (60 mg total) by mouth daily. 07/21/20  Yes Lucilla Lame, MD  Famotidine (PEPCID AC MAXIMUM STRENGTH PO) 1 tablet 2 (two) times daily. 09/07/18  Yes [provider]  fexofenadine (ALLEGRA) 180 MG tablet Take 180 mg by mouth daily.   Yes [provider]  gabapentin (NEURONTIN) 100 MG capsule TAKE 2 CAPSULES BY MOUTH IN THE MORNING AND 1 CAPSULE MID-DAY 07/30/20  Yes Bedsole, Amy E, MD  losartan (COZAAR) 50 MG tablet TAKE 1/2 TABLET BY MOUTH DAILY 07/18/20  Yes Bedsole, Amy E, MD  pantoprazole (PROTONIX) 40 MG tablet TAKE 1 TABLET BY MOUTH TWICE A DAY 08/04/20  Yes Lucilla Lame, MD  sucralfate (CARAFATE) 1 GM/10ML suspension Take 10 mLs (1 g total) by mouth 4 (four) times daily -  with meals and at bedtime. Patient not taking: No sig reported 05/12/20   Jinny Sanders, MD    Family History Family History  Problem Relation Age of Onset  . Coronary artery disease Father        stent age 78  . Hypertension Father   . Allergies Mother   . Asthma Brother   . Allergies Brother   . Hypertension Paternal Grandfather   . Kidney disease Neg Hx   . Prostate cancer Neg Hx   . Kidney cancer Neg Hx   . Bladder Cancer Neg Hx     Social History Social History   Tobacco Use  . Smoking status: Never Smoker  . Smokeless tobacco: Never Used  Vaping Use  . Vaping Use: Never used  Substance Use Topics  . Alcohol use: Yes    Comment: Occasionally, maybe 2x/month   . Drug use: No     Allergies   Patient has no known allergies.   Review of Systems Review of Systems  Constitutional: Negative for activity change, appetite change, diaphoresis, fatigue and fever.  HENT: Negative.  Negative for congestion, ear discharge, ear pain, sinus pressure, sinus pain, sneezing, sore throat and tinnitus.   Eyes: Negative.  Negative for photophobia and visual disturbance.  Respiratory: Negative.  Negative for cough and chest tightness.        Positive for snoring but no tests to confirm any sleep  apnea.  Cardiovascular: Negative.  Negative for chest pain and palpitations.  Gastrointestinal: Negative.   Genitourinary: Negative.   Musculoskeletal: Negative.   Skin: Negative.   Neurological: Positive for light-headedness and headaches. Negative for dizziness, seizures, syncope, speech difficulty, weakness and numbness.  Psychiatric/Behavioral: Negative for confusion, hallucinations and sleep disturbance.  All other systems reviewed and are negative.    Physical Exam Triage Vital Signs ED Triage Vitals  Enc Vitals Group     BP 08/11/20 1337 (!) 151/98     Pulse Rate 08/11/20 1337 82     Resp 08/11/20 1337 18     Temp 08/11/20 1337 98.4 F (36.9 C)  Temp Source 08/11/20 1337 Oral     SpO2 08/11/20 1337 100 %     Weight 08/11/20 1338 158 lb (71.7 kg)     Height 08/11/20 1338 5\' 8"  (1.727 m)     Head Circumference --      Peak Flow --      Pain Score 08/11/20 1338 0     Pain Loc --      Pain Edu? --      Excl. in [place]? --    No data found.  Updated Vital Signs BP (!) 151/98 (BP Location: Left Arm)   Pulse 82   Temp 98.4 F (36.9 C) (Oral)   Resp 18   Ht 5\' 8"  (1.727 m)   Wt 71.7 kg   SpO2 100%   BMI 24.02 kg/m   Visual Acuity Right Eye Distance:   Left Eye Distance:   Bilateral Distance:    Right Eye Near:   Left Eye Near:    Bilateral Near:     Physical Exam Vitals and nursing note reviewed.  Constitutional:      General: He is not in acute distress.    Appearance: He is well-developed. He is not ill-appearing, toxic-appearing or diaphoretic.  HENT:     Head: Normocephalic and atraumatic.     Mouth/Throat:     Mouth: Mucous membranes are moist.     Pharynx: Oropharynx is clear.  Eyes:     Extraocular Movements: Extraocular movements intact.     Pupils: Pupils are equal, round, and reactive to light.  Neck:     Meningeal: Brudzinski's sign and Kernig's sign absent.  Cardiovascular:     Rate and Rhythm: Normal rate and regular rhythm.  No  extrasystoles are present.    Chest Wall: PMI is not displaced. No thrill.     Pulses: Normal pulses.          Carotid pulses are 2+ on the right side and 2+ on the left side.      Radial pulses are 2+ on the right side and 2+ on the left side.       Femoral pulses are 2+ on the right side and 2+ on the left side.      Popliteal pulses are 2+ on the right side and 2+ on the left side.       Dorsalis pedis pulses are 2+ on the right side and 2+ on the left side.       Posterior tibial pulses are 2+ on the right side and 2+ on the left side.     Heart sounds: Normal heart sounds. No murmur heard. No friction rub. No gallop.   Pulmonary:     Effort: Pulmonary effort is normal. No respiratory distress.     Breath sounds: Normal breath sounds. No stridor. No wheezing, rhonchi or rales.  Musculoskeletal:     Cervical back: Normal range of motion and neck supple. No rigidity.     Right lower leg: No edema.     Left lower leg: No edema.  Lymphadenopathy:     Cervical: No cervical adenopathy.  Skin:    General: Skin is warm and dry.     Capillary Refill: Capillary refill takes less than 2 seconds.  Neurological:     Mental Status: He is alert and oriented to person, place, and time.     GCS: GCS eye subscore is 4. GCS verbal subscore is 5. GCS motor subscore is 6.     Cranial  Nerves: No cranial nerve deficit.     Sensory: No sensory deficit.      UC Treatments / Results  Labs (all labs ordered are listed, but only abnormal results are displayed) Labs Reviewed - No data to display  EKG  Twelve-lead EKG performed August 11, 2020 at 1344 showed normal sinus rhythm.  Ventricular beat of 85 bpm.  Normal PR interval with normal QRS duration.  Normal QT and corrected QT interval.  There is a question of a potential right ventricular conduction delay in V1 but no acute ST or T wave changes noted.  Radiology No results found.  Procedures Procedures (including critical care  time)  Medications Ordered in UC Medications - No data to display  Initial Impression / Assessment and Plan / UC Course  I have reviewed the triage vital signs and the nursing notes.  Pertinent labs & imaging results that were available during my care of the patient were reviewed by me and considered in my medical decision making (see chart for details).  Clinical impression: 2 to 3 months of headache that is worse in the morning.  He is also complained of 2 weeks of lightheadedness at times.  Remainder of history and physical exam is very reassuring.  EKG is also reassuring.  He does say that he has been told he snores but he has had no formal testing for sleep apnea.  Treatment plan: 1.  The findings and treatment plan were discussed in detail with the patient.  Patient was in agreement. 2.  Patient's blood pressure was elevated in the office today.  He is on losartan only according to his medication list.  His morning headaches may be due to hypertension.  He does note that he snores as well so he may have a component of sleep apnea.  I have encouraged him to discuss these conditions with his primary care physician at his appointment later this week.  He may benefit from a sleep study. 3.  He is not having symptoms suggestive of vertigo so no new medications were given. 4.  Educational handouts were provided. 5.  Supportive care, plenty of fluids and plenty of rest. 6.  Offered a work note he deferred. 7.  Follow-up here as needed.    Final Clinical Impressions(s) / UC Diagnoses   Final diagnoses:  Lightheadedness  Morning headache  Generalized anxiety disorder     Discharge Instructions     Patient's blood pressure was elevated in the office today.  He is on losartan only according to his medication list.  His morning headaches may be due to hypertension.  He does note that he snores as well so he may have a component of sleep apnea.  I have encouraged him to discuss these  conditions with his primary care physician at his appointment later this week.  He may benefit from a sleep study. He is not having symptoms suggestive of vertigo so no new medications were given. Educational handouts were provided. Supportive care, plenty of fluids and plenty of rest. Offered a work note he deferred. Follow-up here as needed.    ED Prescriptions    None     PDMP not reviewed this encounter.   Verda Cumins, MD 08/13/20 1800

## 2020-08-14 ENCOUNTER — Encounter: Payer: Self-pay | Admitting: Family Medicine

## 2020-08-14 ENCOUNTER — Ambulatory Visit (INDEPENDENT_AMBULATORY_CARE_PROVIDER_SITE_OTHER): Payer: 59 | Admitting: Family Medicine

## 2020-08-14 ENCOUNTER — Other Ambulatory Visit: Payer: Self-pay

## 2020-08-14 VITALS — BP 128/70 | HR 72 | Temp 98.5°F | Ht 69.0 in | Wt 160.5 lb

## 2020-08-14 DIAGNOSIS — R42 Dizziness and giddiness: Secondary | ICD-10-CM

## 2020-08-14 DIAGNOSIS — I10 Essential (primary) hypertension: Secondary | ICD-10-CM

## 2020-08-14 DIAGNOSIS — K219 Gastro-esophageal reflux disease without esophagitis: Secondary | ICD-10-CM | POA: Diagnosis not present

## 2020-08-14 DIAGNOSIS — R519 Headache, unspecified: Secondary | ICD-10-CM

## 2020-08-14 DIAGNOSIS — R0683 Snoring: Secondary | ICD-10-CM | POA: Diagnosis not present

## 2020-08-14 DIAGNOSIS — G8929 Other chronic pain: Secondary | ICD-10-CM

## 2020-08-14 MED ORDER — DEXLANSOPRAZOLE 60 MG PO CPDR: 60.0000 mg | DELAYED_RELEASE_CAPSULE | Freq: Every day | ORAL | 3 refills | Status: DC

## 2020-08-14 NOTE — Assessment & Plan Note (Signed)
Likely multifactorial.  BP now at goal.  Needs  To get eye glasses prescription fixed. May be improving off gabapentin.  Refer for sleep apnea eval.

## 2020-08-14 NOTE — Assessment & Plan Note (Signed)
Stable, chronic.  Continue current medication.   Losartan 25 mg daily.  Follow BP at home.

## 2020-08-14 NOTE — Assessment & Plan Note (Signed)
Improving off gabapentin.

## 2020-08-14 NOTE — Patient Instructions (Addendum)
We will set up referral for sleep apnea testing. Stay off gabapentin.  Get eye glasses set up. Follow up in 4-6 weeks if symptoms not improving

## 2020-08-14 NOTE — Assessment & Plan Note (Signed)
Concerinng for sleep apnea. Refer for sleep study.

## 2020-08-14 NOTE — Progress Notes (Signed)
Patient ID: Blake Molina, male    DOB: February 14, 1966, 55 y.o.   MRN: [id number]  This visit was conducted in person.  BP 128/70    Pulse 72    Temp 98.5 F (36.9 C) (Temporal)    Ht 5\' 9"  (1.753 m)    Wt 160 lb 8 oz (72.8 kg)    SpO2 99%    BMI 23.70 kg/m    CC:  Chief Complaint  Patient presents with   Dizziness    Seen at Urgent Care 08/11/20   Headache    Over Eyes/Nose-Light Pressure Around Head    Subjective:   HPI: Blake Molina is a 55 y.o. male presenting on 08/14/2020 for Dizziness (Seen at Urgent Care 08/11/20) and Headache (Over Eyes/Nose-Light Pressure Around Head)   Reviewed urgent Care note from 08/11/2020 in detail. Seen for:  Headaches  Off and on for 2-3 months. Changing allergies meds did not help. Noted fogginess and lightheaded but not vertigo.  BP was found to be elevated  151/98 despite losartan 25 mg daily  Snoring was noted so concern for sleep apnea.  EKG:  NSR , no acute ST changes.   Today:  He reports that he feels that issues with car  Mad BP elevated due to sleep difficult, was under stress. BP Readings from Last 3 Encounters:  08/14/20 128/70  08/11/20 (!) 151/98  06/26/20 111/84   Still having nagging pressure  Improved temporarily with tylenol. No change with change to allegra.  He recently weaned down off gabapentin.. had none yesterday.  His headache has improved, is less foggy. Has not had a return of low back pain.  Has gotten set up for glasses 4 days ago.. prescription is wrong... in process of getting new ones.   He recently tried Dexilant for his GERD..  This helped a lot more.  Endoscopy was clear.. no esophagitis.  GI sent in a prescription but they are not responding to the pharmacy.. he requested that we try to get this med approved.   Failed omeprazole, famotidine, nexium, Carafate, Protonix, prevacid.Marland Kitchen took each of these > 6 weeks.  Relevant past medical, surgical, family and social history reviewed and updated as  indicated. Interim medical history since our last visit reviewed. Allergies and medications reviewed and updated. Outpatient Medications Prior to Visit  Medication Sig Dispense Refill   Acetaminophen (TYLENOL 8 HOUR PO) Take by mouth.     albuterol (VENTOLIN HFA) 108 (90 Base) MCG/ACT inhaler Inhale 1-2 puffs into the lungs every 6 (six) hours as needed for wheezing or shortness of breath. 3 each 1   budesonide-formoterol (SYMBICORT) 80-4.5 MCG/ACT inhaler Inhale 2 puffs into the lungs 2 (two) times daily. 1 Inhaler 3   busPIRone (BUSPAR) 10 MG tablet TAKE 1 TABLET BY MOUTH THREE TIMES A DAY 270 tablet 0   dexlansoprazole (DEXILANT) 60 MG capsule Take 1 capsule (60 mg total) by mouth daily. 90 capsule 3   Famotidine (PEPCID AC MAXIMUM STRENGTH PO) 1 tablet 2 (two) times daily.     fexofenadine (ALLEGRA) 180 MG tablet Take 180 mg by mouth daily.     losartan (COZAAR) 50 MG tablet TAKE 1/2 TABLET BY MOUTH DAILY 45 tablet 0   pantoprazole (PROTONIX) 40 MG tablet TAKE 1 TABLET BY MOUTH TWICE A DAY 180 tablet 1   traMADol (ULTRAM) 50 MG tablet Take 50 mg by mouth. 1 tablet in morning and 1/2 tablet in the afternoon     gabapentin (NEURONTIN)  100 MG capsule TAKE 2 CAPSULES BY MOUTH IN THE MORNING AND 1 CAPSULE MID-DAY (Patient not taking: Reported on 08/14/2020) 90 capsule 1   sucralfate (CARAFATE) 1 GM/10ML suspension Take 10 mLs (1 g total) by mouth 4 (four) times daily -  with meals and at bedtime. (Patient not taking: No sig reported) 420 mL 0   No facility-administered medications prior to visit.     Per HPI unless specifically indicated in ROS section below Review of Systems Objective:  BP 128/70    Pulse 72    Temp 98.5 F (36.9 C) (Temporal)    Ht 5\' 9"  (1.753 m)    Wt 160 lb 8 oz (72.8 kg)    SpO2 99%    BMI 23.70 kg/m   Wt Readings from Last 3 Encounters:  08/14/20 160 lb 8 oz (72.8 kg)  08/11/20 158 lb (71.7 kg)  06/26/20 161 lb 9.6 oz (73.3 kg)      Physical Exam     Results for orders placed or performed during the hospital encounter of 06/24/20  SARS CORONAVIRUS 2 (TAT 6-24 HRS) Nasopharyngeal Nasopharyngeal Swab   Specimen: Nasopharyngeal Swab  Result Value Ref Range   SARS Coronavirus 2 NEGATIVE NEGATIVE    This visit occurred during the SARS-CoV-2 public health emergency.  Safety protocols were in place, including screening questions prior to the visit, additional usage of staff PPE, and extensive cleaning of exam room while observing appropriate contact time as indicated for disinfecting solutions.   COVID 19 screen:  No recent travel or known exposure to COVID19 The patient denies respiratory symptoms of COVID 19 at this time. The importance of social distancing was discussed today.   Assessment and Plan Problem List Items Addressed This Visit    Chronic nonintractable headache - Primary    Likely multifactorial.  BP now at goal.  Needs  To get eye glasses prescription fixed. May be improving off gabapentin.  Refer for sleep apnea eval.      Relevant Medications   traMADol (ULTRAM) 50 MG tablet   Other Relevant Orders   Ambulatory referral to Sleep Studies   Dizziness    Improving off gabapentin.      Gastroesophageal reflux disease   Hypertension, essential, benign    Stable, chronic.  Continue current medication.   Losartan 25 mg daily.  Follow BP at home.      Snoring     Concerinng for sleep apnea. Refer for sleep study.        Relevant Orders   Ambulatory referral to Sleep Studies     Meds ordered this encounter  Medications   dexlansoprazole (DEXILANT) 60 MG capsule    Sig: Take 1 capsule (60 mg total) by mouth daily.    Dispense:  90 capsule    Refill:  3    Failed omeprazole, famotidine, nexium,   Carafate, Protonix, prevacid.Marland Kitchen took each of these > 6 weeks.      Orders Placed This Encounter  Procedures   Ambulatory referral to Sleep Studies    Referral Priority:   Routine    Referral Type:    Consultation    Referral Reason:   Specialty Services Required    Number of Visits Requested:   1      [name], MD

## 2020-08-21 ENCOUNTER — Telehealth: Payer: Self-pay | Admitting: Family Medicine

## 2020-08-21 NOTE — Telephone Encounter (Signed)
Spoke with patient and pharmacist at [facility] who states Dexilant needs a PA due to being a step therapy medication.  PA completed over the phone and sent for review.  Can take up to 72 hours for a decision.  Blake Molina notified of this via telephone.

## 2020-08-21 NOTE — Telephone Encounter (Signed)
Pt called in wanted to know the status on the dexilant he hasnt heard anything about it.

## 2020-08-22 NOTE — Telephone Encounter (Signed)
PA approved effective from 08/22/2020 to 08/21/2021.  Vishal notified of approval via telephone.

## 2020-08-26 ENCOUNTER — Encounter: Payer: BC Managed Care – PPO | Admitting: Family Medicine

## 2020-08-29 ENCOUNTER — Telehealth: Payer: Self-pay

## 2020-08-29 NOTE — Telephone Encounter (Signed)
Patient is calling in stating that he is wanting someone to give him a call back as he has figured out which medication (busPIRone (BUSPAR) 10 MG tablet) causes his dizziness. Wondering if Dr.Bedsole can switch the medication to something else.

## 2020-08-29 NOTE — Telephone Encounter (Signed)
Call pt ..  if buspar causing dizziness... he can first try to decrease to 1/2 tab ( 5 mg) three times daily... see if anxiety controlled but no dizziness on lower dose.

## 2020-09-01 NOTE — Telephone Encounter (Signed)
Blake Molina notified as instructed by telephone.  He will decrease dose as instructed.  He now feels the dizziness may be related to eye problems.  He got new glasses on Saturday which are progressive lens so he is trying to get use to those.  He states the issue happens with driving and that brings on the anxiety.  I recommend that he decrease his Buspar as discussed for the next two week while he gets adjusted to his new glasses and if he is still continues to have the dizziness to call us back for possible medication change.   He also wanted to let Dr. Diona Browner know that he made another appointment with the GI because he feels they are missing something.  He is still having GERD everyday and the aciphex was going to cost him $200 a month even with the PA.

## 2020-09-02 NOTE — Telephone Encounter (Signed)
Noted  

## 2020-09-08 ENCOUNTER — Ambulatory Visit (INDEPENDENT_AMBULATORY_CARE_PROVIDER_SITE_OTHER): Payer: 59 | Admitting: Gastroenterology

## 2020-09-08 ENCOUNTER — Other Ambulatory Visit: Payer: Self-pay

## 2020-09-08 ENCOUNTER — Encounter: Payer: Self-pay | Admitting: Gastroenterology

## 2020-09-08 VITALS — BP 128/83 | HR 82 | Temp 97.7°F | Ht 69.0 in | Wt 159.6 lb

## 2020-09-08 DIAGNOSIS — K219 Gastro-esophageal reflux disease without esophagitis: Secondary | ICD-10-CM | POA: Diagnosis not present

## 2020-09-08 NOTE — Progress Notes (Signed)
Primary Care Physician: Jinny Sanders, MD  Primary Gastroenterologist:  Dr. Lucilla Lame  Chief Complaint  Patient presents with  . Follow-up    GERD    HPI: Blake Molina is a 55 y.o. male here for heartburn. The patient has a history of an upper endoscopy not showing any sign of esophagitis and has had heartburn that has been helping Dexilant.  The patient had called last month asking for samples of Dexilant.  The patient was informed that Dexilant is no longer sampled has gone generic.  The patient was seen by his primary care provider who refilled his Dexilant and his Pepcid.  The patient had told his primary care provider that he wanted to follow up with GI again because he felt that something was being missed. The images from his EGD are shown below and only showed an irregular Z line.    The patient reports that he never got complete relief from any of the medications he's taken and states that Tums does not even touches symptoms.  Past Medical History:  Diagnosis Date  . Allergic rhinitis, cause unspecified   . Asthma   . BPH without obstruction/lower urinary tract symptoms   . Congenital hypertrophic pyloric stenosis   . Esophageal reflux   . Headache    sinus  . Heart burn   . History of kidney stones   . Hypertension   . Other chest pain   . Panic disorder without agoraphobia   . Unspecified asthma(493.90)     Current Outpatient Medications  Medication Sig Dispense Refill  . Acetaminophen (TYLENOL 8 HOUR PO) Take by mouth.    Marland Kitchen albuterol (VENTOLIN HFA) 108 (90 Base) MCG/ACT inhaler Inhale 1-2 puffs into the lungs every 6 (six) hours as needed for wheezing or shortness of breath. 3 each 1  . budesonide-formoterol (SYMBICORT) 80-4.5 MCG/ACT inhaler Inhale 2 puffs into the lungs 2 (two) times daily. 1 Inhaler 3  . busPIRone (BUSPAR) 10 MG tablet TAKE 1 TABLET BY MOUTH THREE TIMES A DAY 270 tablet 0  . dexlansoprazole (DEXILANT) 60 MG capsule Take 1 capsule (60  mg total) by mouth daily. 90 capsule 3  . Famotidine (PEPCID AC MAXIMUM STRENGTH PO) 1 tablet 2 (two) times daily.    . fexofenadine (ALLEGRA) 180 MG tablet Take 180 mg by mouth daily.    Marland Kitchen losartan (COZAAR) 50 MG tablet TAKE 1/2 TABLET BY MOUTH DAILY 45 tablet 0  . traMADol (ULTRAM) 50 MG tablet Take 50 mg by mouth. 1 tablet in morning and 1/2 tablet in the afternoon    . gabapentin (NEURONTIN) 100 MG capsule TAKE 2 CAPSULES BY MOUTH IN THE MORNING AND 1 CAPSULE MID-DAY (Patient not taking: No sig reported) 90 capsule 1   No current facility-administered medications for this visit.    Allergies as of 09/08/2020  . (No Known Allergies)    ROS:  General: Negative for anorexia, weight loss, fever, chills, fatigue, weakness. ENT: Negative for hoarseness, difficulty swallowing , nasal congestion. CV: Negative for chest pain, angina, palpitations, dyspnea on exertion, peripheral edema.  Respiratory: Negative for dyspnea at rest, dyspnea on exertion, cough, sputum, wheezing.  GI: See history of present illness. GU:  Negative for dysuria, hematuria, urinary incontinence, urinary frequency, nocturnal urination.  Endo: Negative for unusual weight change.    Physical Examination:   BP 128/83   Pulse 82   Temp 97.7 F (36.5 C) (Temporal)   Ht 5\' 9"  (1.753 m)   Wt  159 lb 9.6 oz (72.4 kg)   BMI 23.57 kg/m   General: Well-nourished, well-developed in no acute distress.  Eyes: No icterus. Conjunctivae pink. Neuro: Alert and oriented x 3.  Grossly intact. Skin: Warm and dry, no jaundice.   Psych: Alert and cooperative, normal mood and affect.  Labs:    Imaging Studies: No results found.  Assessment and Plan:   Blake Molina is a 55 y.o. y/o male who comes in today with a history of heartburn that has not been helped with 40 mg of Protonix twice a day.  He also states that [place] did not help his symptoms.  He reports morning nausea. The patient has been told that if his symptoms  are not even slightly improved with the antiacid medication that likely his symptoms are not due to acid and another cause may need to be entertained.  The patient will be set up for pH probe evaluation of the esophagus.  The patient will be kept on the medication since he states that he is continuing to have heartburn while on the medication.  The further recommendations will be pending the results of the 24-hour pH probe.  The patient has been explained the plan and agrees with it.     Lucilla Lame, MD. Marval Regal    Note: This dictation was prepared with Dragon dictation along with smaller phrase technology. Any transcriptional errors that result from this process are unintentional.

## 2020-09-16 ENCOUNTER — Other Ambulatory Visit: Payer: Self-pay

## 2020-09-16 ENCOUNTER — Encounter: Payer: Self-pay | Admitting: Family Medicine

## 2020-09-16 ENCOUNTER — Ambulatory Visit (INDEPENDENT_AMBULATORY_CARE_PROVIDER_SITE_OTHER): Payer: 59 | Admitting: Family Medicine

## 2020-09-16 VITALS — BP 120/70 | HR 69 | Temp 98.4°F | Ht 68.66 in | Wt 154.8 lb

## 2020-09-16 DIAGNOSIS — Z125 Encounter for screening for malignant neoplasm of prostate: Secondary | ICD-10-CM

## 2020-09-16 DIAGNOSIS — Z1159 Encounter for screening for other viral diseases: Secondary | ICD-10-CM

## 2020-09-16 DIAGNOSIS — R519 Headache, unspecified: Secondary | ICD-10-CM

## 2020-09-16 DIAGNOSIS — Z0000 Encounter for general adult medical examination without abnormal findings: Secondary | ICD-10-CM

## 2020-09-16 DIAGNOSIS — Z13220 Encounter for screening for lipoid disorders: Secondary | ICD-10-CM

## 2020-09-16 DIAGNOSIS — F409 Phobic anxiety disorder, unspecified: Secondary | ICD-10-CM | POA: Diagnosis not present

## 2020-09-16 DIAGNOSIS — G8929 Other chronic pain: Secondary | ICD-10-CM

## 2020-09-16 DIAGNOSIS — F418 Other specified anxiety disorders: Secondary | ICD-10-CM

## 2020-09-16 DIAGNOSIS — R42 Dizziness and giddiness: Secondary | ICD-10-CM | POA: Diagnosis not present

## 2020-09-16 DIAGNOSIS — I10 Essential (primary) hypertension: Secondary | ICD-10-CM

## 2020-09-16 MED ORDER — DULOXETINE HCL 30 MG PO CPEP: 30.0000 mg | ORAL_CAPSULE | Freq: Every day | ORAL | 3 refills | Status: DC

## 2020-09-16 NOTE — Progress Notes (Signed)
Patient ID: ARGYLE GUSTAFSON, male    DOB: 21-Apr-1966, 55 y.o.   MRN: [id number]  This visit was conducted in person.  BP 120/70   Pulse 69   Temp 98.4 F (36.9 C) (Temporal)   Ht 5' 8.66" (1.744 m)   Wt 154 lb 12 oz (70.2 kg)   SpO2 98%   BMI 23.08 kg/m    CC:  Chief Complaint  Patient presents with  . Annual Exam    Subjective:   HPI: JULUIS FITZSIMMONS is a 55 y.o. male presenting on 09/16/2020 for Annual Exam   Due for fasting lab.. cholesterol etc.  Hypertension:   Good control on losartan 50 mg 1/2 tab daily. BP Readings from Last 3 Encounters:  09/16/20 120/70  09/08/20 128/83  08/14/20 128/70  Using medication without problems or lightheadedness:  Chest pain with exertion: Edema: Short of breath: Average home BPs: Other issues:    Situational anxiety:  Now on half dose of Buspar... anxiety came back so restarted at previous dose.  He is still having a lot of anxiety. worst with driving on highway. During day.. only very slightly nervous.. none if busy doing something. PHQ9: 3  GAD7 9  Chronic low back pain:  Using tramadol 1 tab in AM and 1/2 tab at night.. tried lower dose tramadol but dizziness not better.  Stopped gabapentin given dizziness.  faily well controlled at the moment.     GERD: now folled by Dr. Allen Norris.. last OV from 09/08/2020 reviewed.  Plan  PH probe  Now on pantoprazole.  Headaches better with new glasses.  Relevant past medical, surgical, family and social history reviewed and updated as indicated. Interim medical history since our last visit reviewed. Allergies and medications reviewed and updated. Outpatient Medications Prior to Visit  Medication Sig Dispense Refill  . Acetaminophen (TYLENOL 8 HOUR PO) Take by mouth.    Marland Kitchen albuterol (VENTOLIN HFA) 108 (90 Base) MCG/ACT inhaler Inhale 1-2 puffs into the lungs every 6 (six) hours as needed for wheezing or shortness of breath. 3 each 1  . budesonide-formoterol (SYMBICORT) 80-4.5 MCG/ACT  inhaler Inhale 2 puffs into the lungs 2 (two) times daily. 1 Inhaler 3  . busPIRone (BUSPAR) 10 MG tablet TAKE 1 TABLET BY MOUTH THREE TIMES A DAY 270 tablet 0  . dexlansoprazole (DEXILANT) 60 MG capsule Take 1 capsule (60 mg total) by mouth daily. 90 capsule 3  . Famotidine (PEPCID AC MAXIMUM STRENGTH PO) 1 tablet 2 (two) times daily.    . fexofenadine (ALLEGRA) 180 MG tablet Take 180 mg by mouth daily.    Marland Kitchen losartan (COZAAR) 50 MG tablet TAKE 1/2 TABLET BY MOUTH DAILY 45 tablet 0  . traMADol (ULTRAM) 50 MG tablet Take 50 mg by mouth. 1 tablet in morning and 1/2 tablet in the afternoon    . gabapentin (NEURONTIN) 100 MG capsule TAKE 2 CAPSULES BY MOUTH IN THE MORNING AND 1 CAPSULE MID-DAY (Patient not taking: No sig reported) 90 capsule 1   No facility-administered medications prior to visit.     Per HPI unless specifically indicated in ROS section below Review of Systems  Constitutional: Negative for fatigue and fever.  HENT: Negative for ear pain.   Eyes: Negative for pain.  Respiratory: Negative for cough and shortness of breath.   Cardiovascular: Negative for chest pain, palpitations and leg swelling.  Gastrointestinal: Negative for abdominal pain.  Genitourinary: Negative for dysuria.  Musculoskeletal: Negative for arthralgias.  Neurological: Negative for syncope, light-headedness  and headaches.  Psychiatric/Behavioral: Negative for self-injury and suicidal ideas. The patient is nervous/anxious.    Objective:  BP 120/70   Pulse 69   Temp 98.4 F (36.9 C) (Temporal)   Ht 5' 8.66" (1.744 m)   Wt 154 lb 12 oz (70.2 kg)   SpO2 98%   BMI 23.08 kg/m   Wt Readings from Last 3 Encounters:  09/16/20 154 lb 12 oz (70.2 kg)  09/08/20 159 lb 9.6 oz (72.4 kg)  08/14/20 160 lb 8 oz (72.8 kg)      Physical Exam    Results for orders placed or performed during the hospital encounter of 06/24/20  SARS CORONAVIRUS 2 (TAT 6-24 HRS) Nasopharyngeal Nasopharyngeal Swab   Specimen:  Nasopharyngeal Swab  Result Value Ref Range   SARS Coronavirus 2 NEGATIVE NEGATIVE    This visit occurred during the SARS-CoV-2 public health emergency.  Safety protocols were in place, including screening questions prior to the visit, additional usage of staff PPE, and extensive cleaning of exam room while observing appropriate contact time as indicated for disinfecting solutions.   COVID 19 screen:  No recent travel or known exposure to COVID19 The patient denies respiratory symptoms of COVID 19 at this time. The importance of social distancing was discussed today.   Assessment and Plan The patient's preventative maintenance and recommended screening tests for an annual wellness exam were reviewed in full today. Brought up to date unless services declined.  Counselled on the importance of diet, exercise, and its role in overall health and mortality. The patient's FH and SH was reviewed, including their home life, tobacco status, and drug and alcohol status.    Vaccines:uptodate with  Td. Considering shingles vaccine. Refused COVID vaccines. Prostate Cancer Screen: Due for re-eval. Colon Cancer Screen:01/2019 polyp repeat in 10 years Dr. Allen Norris.      Smoking Status: nonsmoker ETOH/ drug WLN:LGXQ/ none HIV screen:Refused    Problem List Items Addressed This Visit    Chronic nonintractable headache    Resolved now with new glasses.      Relevant Medications   DULoxetine (CYMBALTA) 30 MG capsule   Dizziness    Normal lab eval .. pt feels due to buspar... will stop.       Relevant Orders   CBC with Differential/Platelet   Vitamin B12   Hypertension, essential, benign    Stable, chronic.  Continue current medication.   Losartan 25 mg daily      Situational anxiety    Primarily associated with driving on highway... no past events triggering.  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 Stop Buspar ( not effective now and possible dizziness SE) and change to cymbalta 30 mg.. if tolerate will increase to 60 mg daily. Follow up in 4 weeks.      Relevant Medications   DULoxetine (CYMBALTA) 30 MG capsule   Other Relevant Orders   Ambulatory referral to Psychology    Other Visit Diagnoses    Routine general medical examination at a health care facility    -  Primary   Screening cholesterol level       Relevant Orders   Lipid panel   Comprehensive metabolic panel   Screening for prostate cancer       Relevant Orders   PSA   Need for hepatitis C screening test       Relevant Orders   Hepatitis C antibody   Unrealistic fear  Relevant Orders   Ambulatory referral to Psychology          [name], MD

## 2020-09-16 NOTE — Patient Instructions (Addendum)
Call to set up counselor.   Stop Buspar and try Cymbalta 30 mg daily.

## 2020-09-17 NOTE — Assessment & Plan Note (Signed)
Primarily associated with driving on highway... no past events triggering.  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 Stop Buspar ( not effective now and possible dizziness SE) and change to cymbalta 30 mg.. if tolerate will increase to 60 mg daily. Follow up in 4 weeks.

## 2020-09-17 NOTE — Assessment & Plan Note (Signed)
Stable, chronic.  Continue current medication.  Losartan 25 mg daily 

## 2020-09-17 NOTE — Assessment & Plan Note (Signed)
Stable, chronic.  Continue current medication.   Using tramadol 1 tab in AM and 1/2 tab at night.. tried lower dose tramadol but dizziness not better.  Stopped gabapentin given dizziness.  faily well controlled at the moment.  PDMP reviewed during this encounter.

## 2020-09-17 NOTE — Assessment & Plan Note (Signed)
Resolved now with new glasses.

## 2020-09-17 NOTE — Assessment & Plan Note (Signed)
Normal lab eval .. pt feels due to buspar... will stop.

## 2020-09-18 ENCOUNTER — Other Ambulatory Visit: Payer: Self-pay | Admitting: Family Medicine

## 2020-09-18 ENCOUNTER — Telehealth: Payer: Self-pay | Admitting: Family Medicine

## 2020-09-18 DIAGNOSIS — Z1159 Encounter for screening for other viral diseases: Secondary | ICD-10-CM

## 2020-09-18 DIAGNOSIS — R42 Dizziness and giddiness: Secondary | ICD-10-CM

## 2020-09-18 DIAGNOSIS — Z13220 Encounter for screening for lipoid disorders: Secondary | ICD-10-CM

## 2020-09-18 DIAGNOSIS — Z125 Encounter for screening for malignant neoplasm of prostate: Secondary | ICD-10-CM

## 2020-09-18 NOTE — Telephone Encounter (Signed)
Patient would like a order for his labs for his cpe so that he can go to his local [facility]. Please advise patient if this can be done.  [facility] in [place], [place]

## 2020-09-18 NOTE — Telephone Encounter (Signed)
Future orders in Epic.  Will have Terri change orders for [facility].

## 2020-09-22 NOTE — Telephone Encounter (Signed)
Spoke with Jenny Reichmann.  He states since he has started the Cymbalta 5 days ago he is having indigestion, nausea, diarrhea.  He states he feels like there is a ball sitting at the top of his chest/bottom of his throat. He also states he is having palpitations that show up in the evening around 3 or 4 o'clock in the afternoon and last for 1 to 2 hours.  He states he is taking the Cymbalta around 7:00 pm but he is having 24 hrs of agony.  Please advise. I also let Gabe know that lab orders were placed so he can go to [facility] for his blood work.

## 2020-09-23 ENCOUNTER — Other Ambulatory Visit: Payer: 59

## 2020-09-24 ENCOUNTER — Telehealth: Payer: Self-pay | Admitting: Gastroenterology

## 2020-09-24 LAB — COMPREHENSIVE METABOLIC PANEL
ALT: 13 IU/L (ref 0–44)
AST: 20 IU/L (ref 0–40)
Albumin/Globulin Ratio: 2 (ref 1.2–2.2)
Albumin: 4.5 g/dL (ref 3.8–4.9)
Alkaline Phosphatase: 48 IU/L (ref 44–121)
BUN/Creatinine Ratio: 13 (ref 9–20)
BUN: 13 mg/dL (ref 6–24)
Bilirubin Total: 0.5 mg/dL (ref 0.0–1.2)
CO2: 22 mmol/L (ref 20–29)
Calcium: 9.9 mg/dL (ref 8.7–10.2)
Chloride: 102 mmol/L (ref 96–106)
Creatinine, Ser: 1.04 mg/dL (ref 0.76–1.27)
Globulin, Total: 2.3 g/dL (ref 1.5–4.5)
Glucose: 92 mg/dL (ref 65–99)
Potassium: 4.5 mmol/L (ref 3.5–5.2)
Sodium: 140 mmol/L (ref 134–144)
Total Protein: 6.8 g/dL (ref 6.0–8.5)
eGFR: 85 mL/min/{1.73_m2} (ref >59)

## 2020-09-24 LAB — CBC WITH DIFFERENTIAL/PLATELET
Basophils Absolute: 0 10*3/uL (ref 0.0–0.2)
Basos: 1 %
EOS (ABSOLUTE): 0.1 10*3/uL (ref 0.0–0.4)
Eos: 3 %
Hematocrit: 42.8 % (ref 37.5–51.0)
Hemoglobin: 14.3 g/dL (ref 13.0–17.7)
Immature Grans (Abs): 0 10*3/uL (ref 0.0–0.1)
Immature Granulocytes: 1 %
Lymphocytes Absolute: 1.7 10*3/uL (ref 0.7–3.1)
Lymphs: 33 %
MCH: 29.3 pg (ref 26.6–33.0)
MCHC: 33.4 g/dL (ref 31.5–35.7)
MCV: 88 fL (ref 79–97)
Monocytes Absolute: 0.4 10*3/uL (ref 0.1–0.9)
Monocytes: 8 %
Neutrophils Absolute: 3 10*3/uL (ref 1.4–7.0)
Neutrophils: 54 %
Platelets: 311 10*3/uL (ref 150–450)
RBC: 4.88 x10E6/uL (ref 4.14–5.80)
RDW: 12.3 % (ref 11.6–15.4)
WBC: 5.3 10*3/uL (ref 3.4–10.8)

## 2020-09-24 LAB — VITAMIN B12: Vitamin B-12: 345 pg/mL (ref 232–1245)

## 2020-09-24 LAB — LIPID PANEL
Chol/HDL Ratio: 3.3 ratio (ref 0.0–5.0)
Cholesterol, Total: 147 mg/dL (ref 100–199)
HDL: 44 mg/dL (ref >39)
LDL Chol Calc (NIH): 91 mg/dL (ref 0–99)
Triglycerides: 57 mg/dL (ref 0–149)
VLDL Cholesterol Cal: 12 mg/dL (ref 5–40)

## 2020-09-24 LAB — PSA: Prostate Specific Ag, Serum: 0.7 ng/mL (ref 0.0–4.0)

## 2020-09-24 LAB — HEPATITIS C ANTIBODY: Hep C Virus Ab: <0.1 s/co ratio (ref 0.0–0.9)

## 2020-09-24 NOTE — Telephone Encounter (Signed)
Sent pt a MyChart message regarding referral to [facility]. I have left a voicemail for the scheduler to return my call to check the status of the referral for the [facility] probe.

## 2020-09-24 NOTE — Telephone Encounter (Signed)
Patient is miserable (0-10 at 30). What can he do? Wants to know about test? Please call him.

## 2020-09-25 ENCOUNTER — Other Ambulatory Visit: Payer: Self-pay

## 2020-09-25 DIAGNOSIS — K219 Gastro-esophageal reflux disease without esophagitis: Secondary | ICD-10-CM

## 2020-09-29 ENCOUNTER — Other Ambulatory Visit: Payer: Self-pay | Admitting: *Deleted

## 2020-09-29 NOTE — Addendum Note (Signed)
Addended by: Carter Kitten on: 09/29/2020 02:52 PM   Modules accepted: Orders

## 2020-10-02 ENCOUNTER — Ambulatory Visit (INDEPENDENT_AMBULATORY_CARE_PROVIDER_SITE_OTHER): Payer: 59 | Admitting: Pulmonary Disease

## 2020-10-02 ENCOUNTER — Other Ambulatory Visit: Payer: Self-pay

## 2020-10-02 ENCOUNTER — Other Ambulatory Visit: Payer: Self-pay | Admitting: Family Medicine

## 2020-10-02 ENCOUNTER — Encounter: Payer: Self-pay | Admitting: Pulmonary Disease

## 2020-10-02 VITALS — BP 118/72 | HR 71 | Temp 97.1°F | Ht 69.57 in | Wt 156.0 lb

## 2020-10-02 DIAGNOSIS — R0683 Snoring: Secondary | ICD-10-CM | POA: Diagnosis not present

## 2020-10-02 NOTE — Patient Instructions (Signed)
Will arrange for home sleep study Will call to arrange for follow up after sleep study reviewed  

## 2020-10-02 NOTE — Progress Notes (Signed)
[facility]  Chief Complaint  Patient presents with  . Consult    Snoring.    Constitutional:  BP 118/72 (BP Location: Left Arm, Patient Position: Sitting, Cuff Size: Normal)   Pulse 71   Temp (!) 97.1 F (36.2 C) (Temporal)   Ht 5' 9.57" (1.767 m)   Wt 156 lb (70.8 kg)   SpO2 98%   BMI 22.66 kg/m   Past Medical History:  Allergies, Asthma, BPH, Pyloric stenosis, GERD, Headaches, Nephrolithiasis, HTN, Panic attacks  Past Surgical History:  He  has a past surgical history that includes Stenosis surgery (other) (1960s); herniated disc-back surgery (1996); Colonoscopy with propofol (N/A, 01/22/2019); polypectomy (01/22/2019); and Esophagogastroduodenoscopy (egd) with propofol (N/A, 06/26/2020).  Brief Summary:  Blake Molina is a 55 y.o. male with snoring.      Subjective:   His wife has been concerned about his snoring.  This has been going on for years.  He will actually stop breathing at times.  He is a restless sleeper.  He can fall asleep while watching TV.  He goes to sleep at 12 am.  He falls asleep in minutes.  He wakes up some times to use the bathroom.  He gets out of bed at 730 am.  He feels okay in the morning, but gets tired as the day goes on.  He denies morning headache.  He does not use anything to help him fall sleep or stay awake.  He denies sleep walking, sleep talking, bruxism, or nightmares.  There is no history of restless legs.  He denies sleep hallucinations, sleep paralysis, or cataplexy.  The Epworth score is 7 out of 24.  He has history of GERD.  Had terrible symptoms while on buspar.  He stopped buspar several weeks ago and reflux symptoms slowly improving.  He also has allergies and asthma.  He gets a tickle in his throat and has to clear his throat frequently.  He uses nasacort prn.   Physical Exam:   Appearance - well kempt   ENMT - no sinus tenderness, no oral exudate, no LAN, Mallampati 3 airway,  no stridor, elongated uvula, scalloped tongue  Respiratory - equal breath sounds bilaterally, no wheezing or rales  CV - s1s2 regular rate and rhythm, no murmurs  Ext - no clubbing, no edema  Skin - no rashes  Psych - normal mood and affect   Sleep Tests:    Social History:  He  reports that he has never smoked. He has never used smokeless tobacco. He reports current alcohol use. He reports that he does not use drugs.  Family History:  His family history includes Allergies in his brother and mother; Asthma in his brother; Coronary artery disease in his father; Hypertension in his father and paternal grandfather.    Discussion:  He has snoring, sleep disruption, apnea, and daytime sleepiness.  He has history of hypertension and anxiety.  I am concerned he could have obstructive sleep apnea.  Assessment/Plan:   Snoring with excessive daytime sleepiness. - will need to arrange for a home sleep study  Throat irritation. - could be combination of allergic rhinitis with post nasal drip and reflux - advised him to try nasal irrigation - he uses nasacort and allegra prn - he remains on dexilant  Mild, persistent asthma. - managed by Dr. Diona Browner - he uses symbicort at night - he uses albuterol when he is working around his horses  Obesity. - discussed how  weight can impact sleep and risk for sleep disordered breathing - discussed options to assist with weight loss: combination of diet modification, cardiovascular and strength training exercises  Cardiovascular risk. - had an extensive discussion regarding the adverse health consequences related to untreated sleep disordered breathing - specifically discussed the risks for hypertension, coronary artery disease, cardiac dysrhythmias, cerebrovascular disease, and diabetes - lifestyle modification discussed  Safe driving practices. - discussed how sleep disruption can increase risk of accidents, particularly when driving - safe  driving practices were discussed  Therapies for obstructive sleep apnea. - if the sleep study shows significant sleep apnea, then various therapies for treatment were reviewed: CPAP, oral appliance, and surgical interventions  Time Spent Involved in Patient Care on Day of Examination:  32 minutes  Follow up:  Patient Instructions  Will arrange for home sleep study Will call to arrange for follow up after sleep study reviewed    Medication List:   Allergies as of 10/02/2020   No Known Allergies     Medication List       Accurate as of October 02, 2020 10:50 AM. If you have any questions, ask your nurse or doctor.        albuterol 108 (90 Base) MCG/ACT inhaler Commonly known as: Ventolin HFA Inhale 1-2 puffs into the lungs every 6 (six) hours as needed for wheezing or shortness of breath.   budesonide-formoterol 80-4.5 MCG/ACT inhaler Commonly known as: SYMBICORT Inhale 2 puffs into the lungs 2 (two) times daily.   dexlansoprazole 60 MG capsule Commonly known as: Dexilant Take 1 capsule (60 mg total) by mouth daily.   fexofenadine 180 MG tablet Commonly known as: ALLEGRA Take 180 mg by mouth daily.   losartan 50 MG tablet Commonly known as: COZAAR TAKE 1/2 TABLET BY MOUTH DAILY   PEPCID AC MAXIMUM STRENGTH PO 1 tablet 2 (two) times daily.   traMADol 50 MG tablet Commonly known as: ULTRAM Take 25 mg by mouth 2 (two) times daily.   TYLENOL 8 HOUR PO Take by mouth.       Signature:  Chesley Mires, MD [place] Pager - [phone] 10/02/2020, 10:50 AM

## 2020-10-09 ENCOUNTER — Ambulatory Visit (INDEPENDENT_AMBULATORY_CARE_PROVIDER_SITE_OTHER): Payer: 59 | Admitting: Family Medicine

## 2020-10-09 ENCOUNTER — Encounter: Payer: Self-pay | Admitting: Family Medicine

## 2020-10-09 ENCOUNTER — Other Ambulatory Visit: Payer: Self-pay

## 2020-10-09 VITALS — BP 120/70 | HR 66 | Temp 98.4°F | Ht 68.66 in | Wt 155.2 lb

## 2020-10-09 DIAGNOSIS — M5440 Lumbago with sciatica, unspecified side: Secondary | ICD-10-CM

## 2020-10-09 DIAGNOSIS — R0683 Snoring: Secondary | ICD-10-CM | POA: Diagnosis not present

## 2020-10-09 DIAGNOSIS — F418 Other specified anxiety disorders: Secondary | ICD-10-CM

## 2020-10-09 DIAGNOSIS — K219 Gastro-esophageal reflux disease without esophagitis: Secondary | ICD-10-CM | POA: Diagnosis not present

## 2020-10-09 DIAGNOSIS — G8929 Other chronic pain: Secondary | ICD-10-CM

## 2020-10-09 DIAGNOSIS — I10 Essential (primary) hypertension: Secondary | ICD-10-CM | POA: Diagnosis not present

## 2020-10-09 MED ORDER — TRAMADOL HCL 50 MG PO TABS: 25.0000 mg | ORAL_TABLET | Freq: Two times a day (BID) | ORAL | 0 refills | Status: AC

## 2020-10-09 MED ORDER — TRAMADOL HCL 50 MG PO TABS: 25.0000 mg | ORAL_TABLET | Freq: Two times a day (BID) | ORAL | 0 refills | Status: DC

## 2020-10-09 NOTE — Assessment & Plan Note (Signed)
Only when driving on open [address].. no issue with daily anxiety in past. No issue in other driving situations. He feels it is due to binocular vision issues and has upcoming appt with eye MD to consider prism for glasses.

## 2020-10-09 NOTE — Progress Notes (Signed)
Patient ID: Blake Molina, male    DOB: 20-Jul-1965, 55 y.o.   MRN: [id number]  This visit was conducted in person.  Temp 98.4 F (36.9 C) (Temporal)   Ht 5' 8.66" (1.744 m)   Wt 155 lb 4 oz (70.4 kg)   BMI 23.15 kg/m    CC:  Chief Complaint  Patient presents with  . Follow-up    Mood    Subjective:   HPI: Blake Molina is a 55 y.o. male presenting on 10/09/2020 for Follow-up (Mood)   Situational anxiety.. possible dizziness from Buspar.. resolved since stopping this med. Headache resolved with new glasses. Reflux symptoms seem to be improving as well gradaully since off Buspar.   Trial of cymbalta.. had intolerable SE.  Reviewed recent GI  Dr. Allen Norris 09/08/2020 and Pulmonary  Dr. Halford Chessman 10/02/2020 notes. GI arranging test for 24 hour PH probe. Restarting the Dexilant has really helped his heartburn.. No issues in last week, nausea resolved. Pulmonary planning a home sleep study to eval snoring  BP Readings from Last 3 Encounters:  10/09/20 120/70  10/02/20 118/72  09/16/20 120/70     He reports daily anxiety is resolved... now only minimal anxiety when driving on hwy. He feels it is due to his binocular vision misalignment  Issues.  Has appt with eye MD week.       Relevant past medical, surgical, family and social history reviewed and updated as indicated. Interim medical history since our last visit reviewed. Allergies and medications reviewed and updated. Outpatient Medications Prior to Visit  Medication Sig Dispense Refill  . Acetaminophen (TYLENOL 8 HOUR PO) Take by mouth.    Marland Kitchen albuterol (VENTOLIN HFA) 108 (90 Base) MCG/ACT inhaler Inhale 1-2 puffs into the lungs every 6 (six) hours as needed for wheezing or shortness of breath. 3 each 1  . budesonide-formoterol (SYMBICORT) 80-4.5 MCG/ACT inhaler Inhale 2 puffs into the lungs 2 (two) times daily. 1 Inhaler 3  . dexlansoprazole (DEXILANT) 60 MG capsule Take 1 capsule (60 mg total) by mouth daily. 90 capsule 3   . Famotidine (PEPCID AC MAXIMUM STRENGTH PO) 1 tablet 2 (two) times daily.    . fexofenadine (ALLEGRA) 180 MG tablet Take 180 mg by mouth daily.    Marland Kitchen losartan (COZAAR) 50 MG tablet TAKE 1/2 TABLET BY MOUTH DAILY 45 tablet 0  . traMADol (ULTRAM) 50 MG tablet Take 25 mg by mouth 2 (two) times daily.     No facility-administered medications prior to visit.     Per HPI unless specifically indicated in ROS section below Review of Systems  Constitutional: Negative for fatigue and fever.  HENT: Negative for ear pain.   Eyes: Negative for pain.  Respiratory: Negative for cough and shortness of breath.   Cardiovascular: Negative for chest pain, palpitations and leg swelling.  Gastrointestinal: Negative for abdominal pain.  Genitourinary: Negative for dysuria.  Musculoskeletal: Negative for arthralgias.  Neurological: Negative for syncope, light-headedness and headaches.  Psychiatric/Behavioral: Negative for dysphoric mood.   Objective:  Temp 98.4 F (36.9 C) (Temporal)   Ht 5' 8.66" (1.744 m)   Wt 155 lb 4 oz (70.4 kg)   BMI 23.15 kg/m   Wt Readings from Last 3 Encounters:  10/09/20 155 lb 4 oz (70.4 kg)  10/02/20 156 lb (70.8 kg)  09/16/20 154 lb 12 oz (70.2 kg)      Physical Exam Constitutional:      Appearance: He is well-developed.  HENT:     Head:  Normocephalic.     Right Ear: Hearing normal.     Left Ear: Hearing normal.     Nose: Nose normal.  Neck:     Thyroid: No thyroid mass or thyromegaly.     Vascular: No carotid bruit.     Trachea: Trachea normal.  Cardiovascular:     Rate and Rhythm: Normal rate and regular rhythm.     Pulses: Normal pulses.     Heart sounds: Heart sounds not distant. No murmur heard. No friction rub. No gallop.      Comments: No peripheral edema Pulmonary:     Effort: Pulmonary effort is normal. No respiratory distress.     Breath sounds: Normal breath sounds.  Skin:    General: Skin is warm and dry.     Findings: No rash.   Psychiatric:        Speech: Speech normal.        Behavior: Behavior normal.        Thought Content: Thought content normal.       Results for orders placed or performed in visit on 09/18/20  Lipid panel  Result Value Ref Range   Cholesterol, Total 147 100 - 199 mg/dL   Triglycerides 57 0 - 149 mg/dL   HDL 44 >39 mg/dL   VLDL Cholesterol Cal 12 5 - 40 mg/dL   LDL Chol Calc (NIH) 91 0 - 99 mg/dL   Chol/HDL Ratio 3.3 0.0 - 5.0 ratio  CBC with Differential/Platelet  Result Value Ref Range   WBC 5.3 3.4 - 10.8 x10E3/uL   RBC 4.88 4.14 - 5.80 x10E6/uL   Hemoglobin 14.3 13.0 - 17.7 g/dL   Hematocrit 42.8 37.5 - 51.0 %   MCV 88 79 - 97 fL   MCH 29.3 26.6 - 33.0 pg   MCHC 33.4 31.5 - 35.7 g/dL   RDW 12.3 11.6 - 15.4 %   Platelets 311 150 - 450 x10E3/uL   Neutrophils 54 Not Estab. %   Lymphs 33 Not Estab. %   Monocytes 8 Not Estab. %   Eos 3 Not Estab. %   Basos 1 Not Estab. %   Neutrophils Absolute 3.0 1.4 - 7.0 x10E3/uL   Lymphocytes Absolute 1.7 0.7 - 3.1 x10E3/uL   Monocytes Absolute 0.4 0.1 - 0.9 x10E3/uL   EOS (ABSOLUTE) 0.1 0.0 - 0.4 x10E3/uL   Basophils Absolute 0.0 0.0 - 0.2 x10E3/uL   Immature Granulocytes 1 Not Estab. %   Immature Grans (Abs) 0.0 0.0 - 0.1 x10E3/uL  Comprehensive metabolic panel  Result Value Ref Range   Glucose 92 65 - 99 mg/dL   BUN 13 6 - 24 mg/dL   Creatinine, Ser 1.04 0.76 - 1.27 mg/dL   eGFR 85 >59 mL/min/1.73   BUN/Creatinine Ratio 13 9 - 20   Sodium 140 134 - 144 mmol/L   Potassium 4.5 3.5 - 5.2 mmol/L   Chloride 102 96 - 106 mmol/L   CO2 22 20 - 29 mmol/L   Calcium 9.9 8.7 - 10.2 mg/dL   Total Protein 6.8 6.0 - 8.5 g/dL   Albumin 4.5 3.8 - 4.9 g/dL   Globulin, Total 2.3 1.5 - 4.5 g/dL   Albumin/Globulin Ratio 2.0 1.2 - 2.2   Bilirubin Total 0.5 0.0 - 1.2 mg/dL   Alkaline Phosphatase 48 44 - 121 IU/L   AST 20 0 - 40 IU/L   ALT 13 0 - 44 IU/L  Hepatitis C antibody  Result Value Ref Range   Hep C Virus  Ab <0.1 0.0 - 0.9 s/co  ratio  PSA  Result Value Ref Range   Prostate Specific Ag, Serum 0.7 0.0 - 4.0 ng/mL  Vitamin B12  Result Value Ref Range   Vitamin B-12 345 232 - 1,245 pg/mL    This visit occurred during the SARS-CoV-2 public health emergency.  Safety protocols were in place, including screening questions prior to the visit, additional usage of staff PPE, and extensive cleaning of exam room while observing appropriate contact time as indicated for disinfecting solutions.   COVID 19 screen:  No recent travel or known exposure to COVID19 The patient denies respiratory symptoms of COVID 19 at this time. The importance of social distancing was discussed today.   Assessment and Plan Problem List Items Addressed This Visit    Chronic low back pain with sciatica - Primary    No red flags of pdmp, reviewed today.  3 refills sent to pharmacy, follow up in 3 months.      Gastroesophageal reflux disease    Improved now off anxiety meds and back on dexilant. Awaiting GI probe.      Hypertension, essential, benign    Stable, chronic.  Continue current medication.   Losartan 25 mg daily.      Situational anxiety     Only when driving on open hwy.. no issue with daily anxiety in past. No issue in other driving situations. He feels it is due to binocular vision issues and has upcoming appt with eye MD to consider prism for glasses.      Snoring    Upcoming  Home sleep study.            [name], MD

## 2020-10-09 NOTE — Assessment & Plan Note (Signed)
Upcoming  Home sleep study.

## 2020-10-09 NOTE — Patient Instructions (Signed)
Continue dexilant.  Stay off medicaitons for anxiety as discussed  and we will see what eye MD say.

## 2020-10-09 NOTE — Assessment & Plan Note (Signed)
No red flags of pdmp, reviewed today.  3 refills sent to pharmacy, follow up in 3 months.

## 2020-10-09 NOTE — Addendum Note (Signed)
Addended by: [name] E on: 10/09/2020 09:59 AM   Modules accepted: Level of Service

## 2020-10-09 NOTE — Assessment & Plan Note (Signed)
Stable, chronic.  Continue current medication.  Losartan 25 mg daily 

## 2020-10-09 NOTE — Assessment & Plan Note (Signed)
Improved now off anxiety meds and back on dexilant. Awaiting GI probe.

## 2020-10-21 ENCOUNTER — Other Ambulatory Visit: Payer: Self-pay | Admitting: *Deleted

## 2020-10-21 MED ORDER — LOSARTAN POTASSIUM 50 MG PO TABS: 0.5000 | ORAL_TABLET | Freq: Every day | ORAL | 3 refills | Status: DC

## 2020-10-23 ENCOUNTER — Ambulatory Visit: Payer: 59

## 2020-10-23 ENCOUNTER — Other Ambulatory Visit: Payer: Self-pay

## 2020-10-23 DIAGNOSIS — G4733 Obstructive sleep apnea (adult) (pediatric): Secondary | ICD-10-CM | POA: Diagnosis not present

## 2020-10-23 DIAGNOSIS — R0683 Snoring: Secondary | ICD-10-CM

## 2020-11-03 ENCOUNTER — Telehealth: Payer: Self-pay | Admitting: Pulmonary Disease

## 2020-11-03 DIAGNOSIS — G4733 Obstructive sleep apnea (adult) (pediatric): Secondary | ICD-10-CM | POA: Diagnosis not present

## 2020-11-03 NOTE — Telephone Encounter (Signed)
HST 10/23/20 >> AHI 13.7, SpO2 low 85%   Please inform him that his sleep study shows mild obstructive sleep apnea.  Please arrange for ROV with me or NP to discuss treatment options.

## 2020-11-04 NOTE — Telephone Encounter (Signed)
Called and went over HST results per Dr Halford Chessman with patient. All questions answered and patient expressed full understanding. Scheduled televisit due to no openings at [facility] office for Wednesday 11/12/20 at 9:30am with NP. Patient agreeable with time, date and telephone visit. Confirmed with patient what phone number to use. Nothing further needed at this time.

## 2020-11-12 ENCOUNTER — Encounter: Payer: 59 | Admitting: Adult Health

## 2020-11-12 ENCOUNTER — Telehealth: Payer: Self-pay | Admitting: *Deleted

## 2020-11-12 ENCOUNTER — Other Ambulatory Visit: Payer: Self-pay

## 2020-11-12 NOTE — Telephone Encounter (Signed)
ATC x2, left vm to call office and I will attempt again in 5-10 minutes.

## 2020-11-12 NOTE — Telephone Encounter (Signed)
ATC x3, no answer, reached vm, left vm to call to reschedule.  Nothing further needed.

## 2020-11-12 NOTE — Telephone Encounter (Signed)
ATC x1 for visit.  No answer, left vm that I would call him back in 5 minutes.

## 2021-01-05 ENCOUNTER — Telehealth: Payer: Self-pay | Admitting: Family Medicine

## 2021-01-05 NOTE — Telephone Encounter (Signed)
Blake Molina called in wanted to know about getting a referral for an ENT he stated that he is having a bout of veritago and wanted to rule it out [facility] -  [address], [place] [address] ph : [phone]

## 2021-01-08 ENCOUNTER — Other Ambulatory Visit: Payer: Self-pay | Admitting: Family Medicine

## 2021-01-08 ENCOUNTER — Encounter: Payer: Self-pay | Admitting: Family Medicine

## 2021-01-08 DIAGNOSIS — R42 Dizziness and giddiness: Secondary | ICD-10-CM

## 2021-01-08 NOTE — Telephone Encounter (Signed)
Referral sent MyChart message sent to patient

## 2021-01-08 NOTE — Telephone Encounter (Signed)
Referral sent 

## 2021-01-09 ENCOUNTER — Other Ambulatory Visit: Payer: Self-pay

## 2021-01-09 ENCOUNTER — Encounter: Payer: Self-pay | Admitting: Family Medicine

## 2021-01-09 ENCOUNTER — Ambulatory Visit (INDEPENDENT_AMBULATORY_CARE_PROVIDER_SITE_OTHER): Payer: 59 | Admitting: Family Medicine

## 2021-01-09 VITALS — BP 130/80 | HR 59 | Temp 98.2°F | Ht 68.66 in | Wt 163.2 lb

## 2021-01-09 DIAGNOSIS — K219 Gastro-esophageal reflux disease without esophagitis: Secondary | ICD-10-CM

## 2021-01-09 DIAGNOSIS — G8929 Other chronic pain: Secondary | ICD-10-CM

## 2021-01-09 DIAGNOSIS — F418 Other specified anxiety disorders: Secondary | ICD-10-CM | POA: Diagnosis not present

## 2021-01-09 DIAGNOSIS — M5440 Lumbago with sciatica, unspecified side: Secondary | ICD-10-CM | POA: Diagnosis not present

## 2021-01-09 DIAGNOSIS — R42 Dizziness and giddiness: Secondary | ICD-10-CM | POA: Diagnosis not present

## 2021-01-09 NOTE — Progress Notes (Signed)
Patient ID: Blake Molina, male    DOB: [birth year], 55 y.o.   MRN: [id number]  This visit was conducted in person.  BP 130/80   Pulse (!) 59   Temp 98.2 F (36.8 C) (Temporal)   Ht 5' 8.66" (1.744 m)   Wt 163 lb 4 oz (74 kg)   SpO2 98%   BMI 24.35 kg/m    CC: Chief Complaint  Patient presents with   Follow-up    Back Pain-Stopped Tramadol.  Now taking Meloxicam    Subjective:   HPI: Blake Molina is a 55 y.o. male presenting on 01/09/2021 for Follow-up (Back Pain-Stopped Tramadol.  Now taking Meloxicam)   Chronic back pain:    Tolerable control on meloxicam 7.5 mg BID.   He has since stopped the tramadol since it was contributing to his vision issue/ tunnel vision/ anxiety driving on road.  He is able to drive on the highway now.  GAD has been talking with counselor. This has helped. He is now driving on highway every day.   He has appt with ENT for constant clearing throat, and possible vertigo.   Dexilant controlling GERD.    Relevant past medical, surgical, family and social history reviewed and updated as indicated. Interim medical history since our last visit reviewed. Allergies and medications reviewed and updated. Outpatient Medications Prior to Visit  Medication Sig Dispense Refill   Acetaminophen (TYLENOL 8 HOUR PO) Take by mouth.     albuterol (VENTOLIN HFA) 108 (90 Base) MCG/ACT inhaler Inhale 1-2 puffs into the lungs every 6 (six) hours as needed for wheezing or shortness of breath. 3 each 1   budesonide-formoterol (SYMBICORT) 80-4.5 MCG/ACT inhaler Inhale 2 puffs into the lungs 2 (two) times daily. 1 Inhaler 3   dexlansoprazole (DEXILANT) 60 MG capsule Take 1 capsule (60 mg total) by mouth daily. 90 capsule 3   fexofenadine (ALLEGRA) 180 MG tablet Take 180 mg by mouth daily.     losartan (COZAAR) 50 MG tablet Take 0.5 tablets (25 mg total) by mouth daily. 45 tablet 3   meloxicam (MOBIC) 7.5 MG tablet Take 7.5 mg by mouth in the morning and at  bedtime.     Famotidine (PEPCID AC MAXIMUM STRENGTH PO) 1 tablet 2 (two) times daily.     traMADol (ULTRAM) 50 MG tablet Take 0.5 tablets (25 mg total) by mouth 2 (two) times daily. 30 tablet 0   No facility-administered medications prior to visit.     Per HPI unless specifically indicated in ROS section below Review of Systems  Constitutional:  Negative for fatigue and fever.  HENT:  Negative for ear pain.   Eyes:  Negative for pain.  Respiratory:  Negative for cough and shortness of breath.   Cardiovascular:  Negative for chest pain, palpitations and leg swelling.  Gastrointestinal:  Negative for abdominal pain.  Genitourinary:  Negative for dysuria.  Musculoskeletal:  Negative for arthralgias.  Neurological:  Negative for syncope, light-headedness and headaches.  Psychiatric/Behavioral:  Negative for dysphoric mood.   Objective:  BP 130/80   Pulse (!) 59   Temp 98.2 F (36.8 C) (Temporal)   Ht 5' 8.66" (1.744 m)   Wt 163 lb 4 oz (74 kg)   SpO2 98%   BMI 24.35 kg/m   Wt Readings from Last 3 Encounters:  01/09/21 163 lb 4 oz (74 kg)  10/09/20 155 lb 4 oz (70.4 kg)  10/02/20 156 lb (70.8 kg)      Physical Exam  Constitutional:      Appearance: He is well-developed.  HENT:     Head: Normocephalic.     Right Ear: Hearing normal.     Left Ear: Hearing normal.     Nose: Nose normal.  Neck:     Thyroid: No thyroid mass or thyromegaly.     Vascular: No carotid bruit.     Trachea: Trachea normal.  Cardiovascular:     Rate and Rhythm: Normal rate and regular rhythm.     Pulses: Normal pulses.     Heart sounds: Heart sounds not distant. No murmur heard.   No friction rub. No gallop.     Comments: No peripheral edema Pulmonary:     Effort: Pulmonary effort is normal. No respiratory distress.     Breath sounds: Normal breath sounds.  Musculoskeletal:     Cervical back: Normal.     Thoracic back: Normal.     Lumbar back: Normal.  Skin:    General: Skin is warm and dry.      Findings: No rash.  Psychiatric:        Speech: Speech normal.        Behavior: Behavior normal.        Thought Content: Thought content normal.      Results for orders placed or performed in visit on 09/18/20  Lipid panel  Result Value Ref Range   Cholesterol, Total 147 100 - 199 mg/dL   Triglycerides 57 0 - 149 mg/dL   HDL 44 >39 mg/dL   VLDL Cholesterol Cal 12 5 - 40 mg/dL   LDL Chol Calc (NIH) 91 0 - 99 mg/dL   Chol/HDL Ratio 3.3 0.0 - 5.0 ratio  CBC with Differential/Platelet  Result Value Ref Range   WBC 5.3 3.4 - 10.8 x10E3/uL   RBC 4.88 4.14 - 5.80 x10E6/uL   Hemoglobin 14.3 13.0 - 17.7 g/dL   Hematocrit 42.8 37.5 - 51.0 %   MCV 88 79 - 97 fL   MCH 29.3 26.6 - 33.0 pg   MCHC 33.4 31.5 - 35.7 g/dL   RDW 12.3 11.6 - 15.4 %   Platelets 311 150 - 450 x10E3/uL   Neutrophils 54 Not Estab. %   Lymphs 33 Not Estab. %   Monocytes 8 Not Estab. %   Eos 3 Not Estab. %   Basos 1 Not Estab. %   Neutrophils Absolute 3.0 1.4 - 7.0 x10E3/uL   Lymphocytes Absolute 1.7 0.7 - 3.1 x10E3/uL   Monocytes Absolute 0.4 0.1 - 0.9 x10E3/uL   EOS (ABSOLUTE) 0.1 0.0 - 0.4 x10E3/uL   Basophils Absolute 0.0 0.0 - 0.2 x10E3/uL   Immature Granulocytes 1 Not Estab. %   Immature Grans (Abs) 0.0 0.0 - 0.1 x10E3/uL  Comprehensive metabolic panel  Result Value Ref Range   Glucose 92 65 - 99 mg/dL   BUN 13 6 - 24 mg/dL   Creatinine, Ser 1.04 0.76 - 1.27 mg/dL   eGFR 85 >59 mL/min/1.73   BUN/Creatinine Ratio 13 9 - 20   Sodium 140 134 - 144 mmol/L   Potassium 4.5 3.5 - 5.2 mmol/L   Chloride 102 96 - 106 mmol/L   CO2 22 20 - 29 mmol/L   Calcium 9.9 8.7 - 10.2 mg/dL   Total Protein 6.8 6.0 - 8.5 g/dL   Albumin 4.5 3.8 - 4.9 g/dL   Globulin, Total 2.3 1.5 - 4.5 g/dL   Albumin/Globulin Ratio 2.0 1.2 - 2.2   Bilirubin Total 0.5 0.0 - 1.2  mg/dL   Alkaline Phosphatase 48 44 - 121 IU/L   AST 20 0 - 40 IU/L   ALT 13 0 - 44 IU/L  Hepatitis C antibody  Result Value Ref Range   Hep C Virus Ab  <0.1 0.0 - 0.9 s/co ratio  PSA  Result Value Ref Range   Prostate Specific Ag, Serum 0.7 0.0 - 4.0 ng/mL  Vitamin B12  Result Value Ref Range   Vitamin B-12 345 232 - 1,245 pg/mL    This visit occurred during the SARS-CoV-2 public health emergency.  Safety protocols were in place, including screening questions prior to the visit, additional usage of staff PPE, and extensive cleaning of exam room while observing appropriate contact time as indicated for disinfecting solutions.   COVID 19 screen:  No recent travel or known exposure to COVID19 The patient denies respiratory symptoms of COVID 19 at this time. The importance of social distancing was discussed today.   Assessment and Plan Problem List Items Addressed This Visit     Chronic low back pain with sciatica - Primary    Tolerable control on meloxicam low dose BID, using tylenol prn flares.       Relevant Medications   meloxicam (MOBIC) 7.5 MG tablet   Gastroesophageal reflux disease    Chronic, excellent control on Dexilant.       Situational anxiety    And associated vision cahnges.. improved but not resolved.  Continue with counseling.  Vision issues from tramadol improving over time.        Vertigo    Has upcoming OV for ENT eval.. info on referral provided.           [name], MD

## 2021-01-09 NOTE — Patient Instructions (Addendum)
I have sent your ENT referral to [facility] [place].   [address], [place], [place] [address] Phone: [phone]   You can call them to schedule your appointment.   Please let us know if there is anything further needed.   --Varney Daily, CMA Referral Coordinator    Continue meloxicam for  chronic back pain.

## 2021-01-09 NOTE — Assessment & Plan Note (Signed)
Chronic, excellent control on Dexilant.

## 2021-01-09 NOTE — Assessment & Plan Note (Signed)
Has upcoming OV for ENT eval.. info on referral provided.

## 2021-01-09 NOTE — Assessment & Plan Note (Signed)
And associated vision cahnges.. improved but not resolved.  Continue with counseling.  Vision issues from tramadol improving over time.

## 2021-01-09 NOTE — Assessment & Plan Note (Signed)
Tolerable control on meloxicam low dose BID, using tylenol prn flares.

## 2021-01-14 ENCOUNTER — Other Ambulatory Visit: Payer: Self-pay

## 2021-01-14 ENCOUNTER — Telehealth: Payer: Self-pay | Admitting: Gastroenterology

## 2021-01-14 DIAGNOSIS — K219 Gastro-esophageal reflux disease without esophagitis: Secondary | ICD-10-CM

## 2021-01-14 NOTE — Telephone Encounter (Signed)
Called the patient to offer an appointment for the Esophageal Mano with the 24 hour pH study on 01/28/21. No answer. Left him information and requested he call me back to discuss.

## 2021-01-14 NOTE — Telephone Encounter (Signed)
Hey,   We received a referral for manometry.

## 2021-01-15 NOTE — Telephone Encounter (Signed)
Patient contacted. He is no longer having any problems. Referral can be closed.

## 2021-01-28 ENCOUNTER — Ambulatory Visit (HOSPITAL_COMMUNITY): Admission: RE | Admit: 2021-01-28 | Payer: 59 | Source: Home / Self Care | Admitting: Gastroenterology

## 2021-01-28 ENCOUNTER — Encounter (HOSPITAL_COMMUNITY): Admission: RE | Payer: Self-pay | Source: Home / Self Care

## 2021-01-28 SURGERY — MANOMETRY, ESOPHAGUS
Anesthesia: Choice

## 2021-02-05 ENCOUNTER — Other Ambulatory Visit: Payer: Self-pay | Admitting: Otolaryngology

## 2021-02-05 DIAGNOSIS — R42 Dizziness and giddiness: Secondary | ICD-10-CM

## 2021-02-17 ENCOUNTER — Ambulatory Visit: Payer: 59

## 2021-03-12 ENCOUNTER — Other Ambulatory Visit: Payer: Self-pay | Admitting: Otolaryngology

## 2021-03-12 DIAGNOSIS — G501 Atypical facial pain: Secondary | ICD-10-CM

## 2021-03-19 ENCOUNTER — Other Ambulatory Visit: Payer: Self-pay

## 2021-03-19 ENCOUNTER — Ambulatory Visit
Admission: RE | Admit: 2021-03-19 | Discharge: 2021-03-19 | Disposition: A | Discharge status: Home / Self Care | Payer: 59 | Source: Ambulatory Visit | Attending: Otolaryngology | Admitting: Otolaryngology

## 2021-03-19 DIAGNOSIS — G501 Atypical facial pain: Secondary | ICD-10-CM | POA: Diagnosis present

## 2021-03-19 MED ORDER — GADOBUTROL 1 MMOL/ML IV SOLN
7.0000 mL | Freq: Once | INTRAVENOUS | Status: AC | PRN
  Administered 2021-03-19: 7 mL via INTRAVENOUS

## 2021-03-25 ENCOUNTER — Telehealth: Payer: Self-pay | Admitting: Family Medicine

## 2021-03-25 NOTE — Telephone Encounter (Signed)
Pt requesting call from CMA to discuss his blood pressure medication. He states he has some questions about it.

## 2021-03-27 NOTE — Telephone Encounter (Signed)
Spoke to pt. He has stopped all meds except the losartan. Daughter (pharmacist) suggested he change from losartan to lisinopril to see if that would help with his dizziness. His BP has been elevated at the ENT.

## 2021-03-27 NOTE — Telephone Encounter (Signed)
Have him make an appt to discuss BP meds and to check BP.

## 2021-03-27 NOTE — Telephone Encounter (Signed)
Pt notified as instructed; pt said he has stopped all his other meds except the losartan 50 mg taking 1/2 tab daily. Pt said to drive to [facility] would be over an hr for the pt and pt wants to know if he can see provider at [facility] to ck BP and possibly change BP med. Dr Silvio Pate and Dr Einar Pheasant are both out of the office today. Pt request cb after Dr Diona Browner reviews the note. Sending the note to Dr Diona Browner and Butch Penny CMA.

## 2021-03-27 NOTE — Telephone Encounter (Signed)
Spoke to pt. Made appt with Dr Einar Pheasant 03-31-21. Will forward note to her as FYI.

## 2021-03-27 NOTE — Telephone Encounter (Signed)
He can do an appt next week for BP check and med change with Dr. Einar Pheasant or Silvio Pate or we can do a virtual visit with me next week if he has a home cuff to check BP daily.

## 2021-03-30 NOTE — Telephone Encounter (Signed)
Noted will see tomorrow 

## 2021-03-31 ENCOUNTER — Encounter: Payer: Self-pay | Admitting: Family Medicine

## 2021-03-31 ENCOUNTER — Other Ambulatory Visit: Payer: Self-pay

## 2021-03-31 ENCOUNTER — Ambulatory Visit (INDEPENDENT_AMBULATORY_CARE_PROVIDER_SITE_OTHER): Payer: 59 | Admitting: Family Medicine

## 2021-03-31 VITALS — BP 138/80 | HR 85 | Temp 97.0°F | Ht 68.6 in | Wt 168.2 lb

## 2021-03-31 DIAGNOSIS — R42 Dizziness and giddiness: Secondary | ICD-10-CM

## 2021-03-31 DIAGNOSIS — F418 Other specified anxiety disorders: Secondary | ICD-10-CM

## 2021-03-31 DIAGNOSIS — I10 Essential (primary) hypertension: Secondary | ICD-10-CM

## 2021-03-31 MED ORDER — AMLODIPINE BESYLATE 5 MG PO TABS: 5.0000 mg | ORAL_TABLET | Freq: Every day | ORAL | 0 refills | Status: DC

## 2021-03-31 NOTE — Patient Instructions (Signed)
Blood pressure - Monitor home blood pressure for 2 days on Losartan - If blood pressure remaining <130/85 - then reasonable to try stopping for 2 days - monitor off medication for 2 days <155/95 then reasonable to continue off for a few days while you see if the medication is the cause of the dizzy symptom   If blood pressure >160/100 --- then start Amlodipine for the trial of being off losartan   If dizziness improves off losartan -- then start Amlodipine and update myself and Dr. Diona Browner  Your blood pressure high.   High blood pressure increases your risk for heart attack and stroke.   Please check your blood pressure 2-4 times a week.   To check your blood pressure 1) Sit in a quiet and relaxed place for 5 minutes 2) Make sure your feet are flat on the ground 3) Consider checking first thing in the morning   Normal blood pressure is less than 140/90 Ideally you blood pressure should be around 120/80

## 2021-03-31 NOTE — Assessment & Plan Note (Signed)
Working with ENT and vestibular PT. At this point presumed to be vestibular though normal MRI and vertigo testing per patient. Only when driving on the highway. Discussed trial of holding losartan to see if symptoms improve. See HTN plan.

## 2021-03-31 NOTE — Assessment & Plan Note (Signed)
Controlled on losartan 25 mg but concern for source of dizziness when driving. Discussed home monitoring and if bp well controlled on medication trial of no medicine for 1-2 days. If remaining <155/95 he will try driving and monitor for dizziness symptom. If blood pressure is >155/95 he will start amlodipine and monitor for symptoms on different medication. Overall low suspicion that the medication is the cause of his dizziness but ideally a trial off all medicine will be best. He will update in about 2 weeks with outcome.

## 2021-03-31 NOTE — Progress Notes (Signed)
Subjective:     Blake Molina is a 55 y.o. male presenting for Blood Pressure Check (Declined flu shot ) and medication discussion     HPI  Stopped all his medication except his losartan  Noted worsening anxiety - and slowly started many new medications  #"out of balance"  - will occur while driving - not room spinning issues - will get sensation that his is going to fall off a balance beam - is following with ENT - Dr. Pryor Ochoa -- had a special test, MRI which were normal - is starting vestibular PT  #Acid reflux - got worse over the last year - always had a little bit of this but worsened and lead to weight loss - had EGD - which was normal - slowly stopped medication - eventually stopped Buspar and the reflux got better - Still taking Dexilant  #Anxiety - stopped buspar due to side effects  He is wondering if stopping losartan will impact his dizziness   Dizziness happens everytime he goes on the highway  Review of Systems  03/25/2021: Phone - Stopped all meds except losartan 25 mg. Dizziness and elevated bp at ENT  Social History   Tobacco Use  Smoking Status Never  Smokeless Tobacco Never        Objective:    BP Readings from Last 3 Encounters:  03/31/21 138/80  01/09/21 130/80  10/09/20 120/70   Wt Readings from Last 3 Encounters:  03/31/21 168 lb 4 oz (76.3 kg)  01/09/21 163 lb 4 oz (74 kg)  10/09/20 155 lb 4 oz (70.4 kg)    BP 138/80   Pulse 85   Temp (!) 97 F (36.1 C) (Temporal)   Ht 5' 8.6" (1.742 m)   Wt 168 lb 4 oz (76.3 kg)   SpO2 99%   BMI 25.14 kg/m    Physical Exam Constitutional:      Appearance: Normal appearance. He is not ill-appearing or diaphoretic.  HENT:     Right Ear: External ear normal.     Left Ear: External ear normal.  Eyes:     General: No scleral icterus.    Extraocular Movements: Extraocular movements intact.     Conjunctiva/sclera: Conjunctivae normal.  Cardiovascular:     Rate and Rhythm: Normal  rate and regular rhythm.  Pulmonary:     Effort: Pulmonary effort is normal. No respiratory distress.     Breath sounds: Normal breath sounds.  Musculoskeletal:     Cervical back: Neck supple.  Skin:    General: Skin is warm and dry.  Neurological:     Mental Status: He is alert. Mental status is at baseline.  Psychiatric:        Mood and Affect: Mood normal.          Assessment & Plan:   Problem List Items Addressed This Visit       Cardiovascular and Mediastinum   Hypertension, essential, benign - Primary    Controlled on losartan 25 mg but concern for source of dizziness when driving. Discussed home monitoring and if bp well controlled on medication trial of no medicine for 1-2 days. If remaining <155/95 he will try driving and monitor for dizziness symptom. If blood pressure is >155/95 he will start amlodipine and monitor for symptoms on different medication. Overall low suspicion that the medication is the cause of his dizziness but ideally a trial off all medicine will be best. He will update in about 2 weeks with outcome.  Relevant Medications   amLODipine (NORVASC) 5 MG tablet     Other   Situational anxiety    Stopping buspar significantly reduce reflux symptoms. He reports avoiding highway driving and otherwise is fine.       Dizziness    Working with ENT and vestibular PT. At this point presumed to be vestibular though normal MRI and vertigo testing per patient. Only when driving on the highway. Discussed trial of holding losartan to see if symptoms improve. See HTN plan.         Return if symptoms worsen or fail to improve.  Lesleigh Noe, MD  This visit occurred during the SARS-CoV-2 public health emergency.  Safety protocols were in place, including screening questions prior to the visit, additional usage of staff PPE, and extensive cleaning of exam room while observing appropriate contact time as indicated for disinfecting solutions.

## 2021-03-31 NOTE — Assessment & Plan Note (Signed)
Stopping buspar significantly reduce reflux symptoms. He reports avoiding highway driving and otherwise is fine.

## 2021-04-08 ENCOUNTER — Other Ambulatory Visit: Payer: Self-pay

## 2021-04-08 ENCOUNTER — Ambulatory Visit: Payer: 59 | Attending: Otolaryngology

## 2021-04-08 DIAGNOSIS — R42 Dizziness and giddiness: Secondary | ICD-10-CM | POA: Insufficient documentation

## 2021-04-08 NOTE — Patient Instructions (Signed)
Access Code: [email or web address] URL: [email or web address] Date: 04/08/2021 Prepared by: Roxana Hires  Exercises Seated Gaze Stabilization with Head Rotation - 4 x daily - 7 x weekly - 3 reps - 60 seconds hold

## 2021-04-08 NOTE — Therapy (Signed)
[facility] [address]. [place], [place], [address] Phone: [phone]   Fax:  [phone]  Physical Therapy Evaluation  Patient Details  Name: Blake Molina MRN: [id number] Date of Birth: 1966-03-14 Referring Provider (PT): Dr. Carloyn Manner  Encounter Date: 04/08/2021   PT End of Session - 04/08/21 2139     Visit Number 1    Number of Visits 9    Date for PT Re-Evaluation 06/03/21    Authorization Type eval: 04/08/21    PT Start Time 0845    PT Stop Time 0930    PT Time Calculation (min) 45 min    Equipment Utilized During Treatment Gait belt    Activity Tolerance Patient tolerated treatment well    Behavior During Therapy WFL for tasks assessed/performed             Past Medical History:  Diagnosis Date   Allergic rhinitis, cause unspecified    Asthma    BPH without obstruction/lower urinary tract symptoms    Congenital hypertrophic pyloric stenosis    Esophageal reflux    Headache    sinus   Heart burn    History of kidney stones    Hypertension    Other chest pain    Panic disorder without agoraphobia    Unspecified asthma(493.90)     Past Surgical History:  Procedure Laterality Date   COLONOSCOPY WITH PROPOFOL N/A 01/22/2019   Procedure: COLONOSCOPY WITH PROPOFOL;  Surgeon: Lucilla Lame, MD;  Location: [place];  Service: Endoscopy;  Laterality: N/A;   ESOPHAGOGASTRODUODENOSCOPY (EGD) WITH PROPOFOL N/A 06/26/2020   Procedure: ESOPHAGOGASTRODUODENOSCOPY (EGD) WITH PROPOFOL;  Surgeon: Lucilla Lame, MD;  Location: [place];  Service: Endoscopy;  Laterality: N/A;   herniated disc-back surgery  1996   POLYPECTOMY  01/22/2019   Procedure: POLYPECTOMY;  Surgeon: Lucilla Lame, MD;  Location: [place];  Service: Endoscopy;;   Stenosis surgery (other)  1960s   hx of pyloric stenosis as infant    There were no vitals filed for this visit.    Subjective Assessment - 04/08/21  2133     Subjective Dizziness    Pertinent History Pt reports that he feels "off balance" when driving at high speeds on the highway for the last two years. "I've never had anxiety but this is starting to create anxiety." He reports that his anxiety first started two years ago when he was first diagnosed with HTN (started taking losartan). He thought that the dizziness might be related to his vision so he saw his eye doctor approximately 8 months ago but no significant findings. He does wear glasses intermittently including when driving. He was also concerned that the dizziness might be related to medication side effects so he started to wean off his medications. However, tapering off his medications did not help his symptoms. He was taking Buspar for his anxiety in the past however this was contributing to his GERD so he stopped taking it. He has tried sertraline in the past however this also caused GI upset so it was discontinued. It was suggested that maybe he try another SSRI however he would prefer not to go on any other prescription medications. He started taking hemp oil two days ago at the recommendation of a friend to see if it will help with his anxiety. No improvement yet. He has had a brain MRI recently which was WNL. He also saw ENT and his VNG study was WNL. He recently started  seeing a counselor to work on his anxiety.    Limitations Other (comment)   Driving high speeds on highway   Diagnostic tests See history    Patient Stated Goals Decrease dizziness    Currently in Pain? No/denies   Unrelated to current episode                VESTIBULAR AND BALANCE EVALUATION   HISTORY:  Subjective history of current problem: Pt reports that he feels "off balance" when driving at high speeds on the highway for the last two years. "I've never had anxiety but this is starting to create anxiety." He reports that his anxiety first started two years ago when he was first diagnosed with HTN  (started taking losartan). He thought that the dizziness might be related to his vision so he saw his eye doctor approximately 8 months ago but no significant findings. He does wear glasses intermittently including when driving. He was also concerned that the dizziness might be related to medication side effects so he started to wean off his medications. However, tapering off his medications did not help his symptoms. He was taking Buspar for his anxiety in the past however this was contributing to his GERD so he stopped taking it. He has tried sertraline in the past however this also caused GI upset so it was discontinued. It was suggested that maybe he try another SSRI however he would prefer not to go on any other prescription medications. He started taking hemp oil two days ago at the recommendation of a friend to see if it will help with his anxiety. No improvement yet. He has had a brain MRI recently which was WNL. He also saw ENT and his VNG study was WNL. He recently started seeing a counselor to work on his anxiety. Description of dizziness: "off balance" (vertigo, unsteadiness, lightheadedness, falling, general unsteadiness, whoozy, swimmy-headed sensation, aural fullness) Frequency: dependent on how often he is driving at high speeds Duration: minutes Symptom nature: motion-provoked (motion provoked, positional, spontaneous, constant, variable, intermittent)   Provocative Factors: high speed driving (mostly on the highway, doesn't always occur), driving over bridges where it opens up "to an expanse." If highway lanes expand from 2 lanes to more lanes he can have symptoms; Easing Factors: Unknown;  Progression of symptoms: (better, worse, no change since onset) unchanged History of similar episodes: None  Falls (yes/no): No  Prior Functional Level: Works as a Engineer, technical sales, symptoms have not interfered with his actual job responsibilities but have affected his travel to/from job  sites;  Auditory complaints (tinnitus, pain, drainage, hearing loss, aural fullness): mild tinnitus bilaterally;  Vision (diplopia, visual field loss, recent changes, last eye exam): None, wears driving glasses Headaches/migraines: Occasional headaches (sinus pressure, no visual disturbance, no photophobia/phonophobia/sensitivity to smells), no history of migraines  Chest pain/palpitations: Pt reports some palpitations, states he saw cardiology and was cleared Head trauma: No history of concussions Red Flags: (dysarthria, dysphagia, drop attacks, bowel and bladder changes, recent weight loss/gain, cancer) Review of systems negative for red flags.     EXAMINATION  POSTURE: WNL  NEUROLOGICAL SCREEN: (2+ unless otherwise noted.) N=normal  Ab=abnormal  Level Dermatome R L Myotome R L Reflex R L  C3 Anterior Neck N N Sidebend C2-3 N N Jaw CN V    C4 Top of Shoulder N N Shoulder Shrug C4 N N Hoffman's UMN    C5 Lateral Upper Arm N N Shoulder ABD C4-5 N N Biceps C5-6  C6 Lateral Arm/ Thumb N N Arm Flex/ Wrist Ext C5-6 N N Brachiorad. C5-6    C7 Middle Finger N N Arm Ext//Wrist Flex C6-7 N N Triceps C7    C8 4th & 5th Finger N N Flex/ Ext Carpi Ulnaris C8 N N Patellar (L3-4)    T1 Medial Arm N N Interossei T1 N N Gastrocnemius    L2 Medial thigh/groin N N Illiopsoas (L2-3) N N     L3 Lower thigh/med.knee N N Quadriceps (L3-4) N N     L4 Medial leg/lat thigh N N Tibialis Ant (L4-5) N N     L5 Lat. leg & dorsal foot N N EHL (L5) N N     S1 post/lat foot/thigh/leg N N Gastrocnemius (S1-2) N N     S2 Post./med. thigh & leg N N Hamstrings (L4-S3) N N       Cranial Nerves Visual acuity and visual fields are intact  Extraocular muscles are intact  Facial sensation is intact bilaterally  Facial strength is intact bilaterally  Hearing is normal as tested by gross conversation Palate elevates midline, normal phonation  Shoulder shrug strength is intact  Tongue protrudes midline      COORDINATION: Finger to Nose: Normal Heel to Shin: Normal Pronator Drift: Negative Rapid Alternating Movements: Normal Finger to Thumb Opposition: Normal   MUSCULOSKELETAL SCREEN: Cervical Spine ROM: WFL and painless in all planes. No gross deficits identified    ROM: WNL   MMT: WNL   Functional Mobility: WNL   Gait: WNL   POSTURAL CONTROL TESTS:   Clinical Test of Sensory Interaction for Balance    (CTSIB):  CONDITION TIME SWAY  Eyes open, firm surface 30 seconds 1+  Eyes closed, firm surface 30 seconds 2+  Eyes open, foam surface 30 seconds 1+  Eyes closed, foam surface 30 seconds 2+    OCULOMOTOR / VESTIBULAR TESTING:  Oculomotor Exam- Room Light  Findings Comments  Ocular Alignment normal   Ocular ROM normal   Spontaneous Nystagmus normal   Gaze-Holding Nystagmus normal   End-Gaze Nystagmus normal   Vergence (normal 2-3") not examined   Smooth Pursuit normal   Cross-Cover Test normal   Saccades normal   VOR Cancellation normal   Left Head Impulse normal   Right Head Impulse normal   Static Acuity not examined   Dynamic Acuity not examined     Oculomotor Exam- Fixation Suppressed: Deferred   BPPV TESTS: Deferred   FUNCTIONAL OUTCOME MEASURES   Results Comments  BERG NT   DGI NT   FGA NT   FOTO 55 Predicted improvement to 12  Tolar NT                     Objective measurements completed on examination: See above findings.                PT Education - 04/08/21 2139     Education Details Plan of care, HEP    Person(s) Educated Patient    Methods Explanation;Demonstration;Handout    Comprehension Verbalized understanding;Returned demonstration;Verbal cues required              PT Short Term Goals - 04/09/21 1607       PT SHORT TERM GOAL #1   Title Pt will be independent with HEP in order to decrease dizziness and improve symptom-free function while driving at high speeds on the highway.    Time  4    Period Weeks  Status Not Met    Target Date 05/06/21               PT Long Term Goals - 04/09/21 1609       PT LONG TERM GOAL #1   Title Pt will increase FOTO to at least 57 in order to demonstrate significant improvement in function related to dizziness    Baseline 04/08/21: 55    Time 8    Period Weeks    Status New    Target Date 06/03/21      PT LONG TERM GOAL #2   Title Pt will report at least 75% improvement in his dizziness symptoms in order to improve his ability to drive on the highway at high speeds to/from work sites    Time 8    Period Weeks    Status New    Target Date 06/03/21      PT LONG TERM GOAL #3   Title Pt will decrease DHI score by at least 18 points in order to demonstrate clinically significant reduction in disability related to dizziness    Baseline 04/08/21: To complete at next visit    Time 8    Period Weeks    Status New    Target Date 06/04/21                    Plan - 04/08/21 2137     Clinical Impression Statement Pt is a pleasant 55 year-old male referred for dizziness when driving at high speeds on the highway. No significant findings today with vestibular/oculomotor testing. Will perform additional vestibular/oculomotor testing with fixation suppression using infrared goggles at next visit. Will also perform additional balance testing. Pt does reports 5/10 dizziness during VOR x 1 horizontal gaze stabilization exercise today so provided for HEP. Pt presents with deficits in dizziness and will benefit from skilled PT services to address these deficits and improve symptom-free function at home and work.    Personal Factors and Comorbidities Comorbidity 1;Time since onset of injury/illness/exacerbation;Past/Current Experience    Comorbidities Panic disorder    Examination-Activity Limitations --   Travel   Examination-Participation Restrictions Driving;Occupation   Limits abiltity to travel to/from job sites    Stability/Clinical Decision Making Unstable/Unpredictable    Clinical Decision Making Low    Rehab Potential Good    PT Frequency 1x / week    PT Duration 8 weeks    PT Treatment/Interventions ADLs/Self Care Home Management;Aquatic Therapy;Biofeedback;Canalith Repostioning;Cryotherapy;Electrical Stimulation;Iontophoresis 28m/ml Dexamethasone;Moist Heat;Traction;Ultrasound;Gait training;Stair training;Therapeutic activities;Therapeutic exercise;Balance training;Neuromuscular re-education;Patient/family education;Manual techniques;Passive range of motion;Dry needling;Vestibular;Visual/perceptual remediation/compensation;Spinal Manipulations;Joint Manipulations    PT Next Visit Plan DHI, fixation suppression vestibular testing, BPPV testing, BERG, FGA, review HEP and progress    PT Home Exercise Plan Access Code: [email or web address]    Consulted and Agree with Plan of Care Patient             Patient will benefit from skilled therapeutic intervention in order to improve the following deficits and impairments:  Dizziness  Visit Diagnosis: Dizziness and giddiness     Problem List Patient Active Problem List   Diagnosis Date Noted   Vertigo 01/09/2021   Snoring 08/14/2020   Dizziness 03/11/2020   Bilateral knee pain 06/12/2019   Special screening for malignant neoplasms, colon    Polyp of sigmoid colon    Situational anxiety 08/31/2018   Hypertension, essential, benign 08/15/2018   Moderate persistent allergic asthma 03/02/2017   Chronic low back pain with sciatica 03/02/2017   Left  ureteral stone 08/07/2015   Hydronephrosis, left 08/07/2015   BPH with obstruction/lower urinary tract symptoms 08/07/2015   Allergic sinusitis 02/03/2007   Gastroesophageal reflux disease 01/11/2007   Phillips Grout PT, DPT, GCS  Hollyanne Schloesser, PT 04/09/2021, 4:12 PM  [facility] [address]. [place], [place], [address] Phone: [phone]   Fax:   [phone]  Name: TILDEN BROZ MRN: [id number] Date of Birth: [birth year]

## 2021-04-13 ENCOUNTER — Ambulatory Visit: Payer: 59

## 2021-04-15 ENCOUNTER — Other Ambulatory Visit: Payer: Self-pay

## 2021-04-15 ENCOUNTER — Ambulatory Visit: Payer: 59 | Attending: Otolaryngology

## 2021-04-15 ENCOUNTER — Ambulatory Visit: Payer: 59

## 2021-04-15 ENCOUNTER — Other Ambulatory Visit: Payer: Self-pay | Admitting: Family Medicine

## 2021-04-15 DIAGNOSIS — R42 Dizziness and giddiness: Secondary | ICD-10-CM | POA: Insufficient documentation

## 2021-04-15 DIAGNOSIS — I10 Essential (primary) hypertension: Secondary | ICD-10-CM

## 2021-04-15 NOTE — Therapy (Signed)
[facility] [address] [place], [place], [address] Phone: [phone]   Fax:  [phone]  Physical Therapy Treatment  Patient Details  Name: Blake Molina MRN: [id number] Date of Birth: [birth year] Referring Provider (PT): Dr. Carloyn Manner   Encounter Date: 04/15/2021   PT End of Session - 04/15/21 0800     Visit Number 2    Number of Visits 9    Date for PT Re-Evaluation 06/03/21    Authorization Type eval: 04/08/21    PT Start Time 0800    PT Stop Time 0845    PT Time Calculation (min) 45 min    Equipment Utilized During Treatment Gait belt    Activity Tolerance Patient tolerated treatment well    Behavior During Therapy WFL for tasks assessed/performed             Past Medical History:  Diagnosis Date   Allergic rhinitis, cause unspecified    Asthma    BPH without obstruction/lower urinary tract symptoms    Congenital hypertrophic pyloric stenosis    Esophageal reflux    Headache    sinus   Heart burn    History of kidney stones    Hypertension    Other chest pain    Panic disorder without agoraphobia    Unspecified asthma(493.90)     Past Surgical History:  Procedure Laterality Date   COLONOSCOPY WITH PROPOFOL N/A 01/22/2019   Procedure: COLONOSCOPY WITH PROPOFOL;  Surgeon: Lucilla Lame, MD;  Location: [facility];  Service: Endoscopy;  Laterality: N/A;   ESOPHAGOGASTRODUODENOSCOPY (EGD) WITH PROPOFOL N/A 06/26/2020   Procedure: ESOPHAGOGASTRODUODENOSCOPY (EGD) WITH PROPOFOL;  Surgeon: Lucilla Lame, MD;  Location: [facility];  Service: Endoscopy;  Laterality: N/A;   herniated disc-back surgery  1996   POLYPECTOMY  01/22/2019   Procedure: POLYPECTOMY;  Surgeon: Lucilla Lame, MD;  Location: [facility];  Service: Endoscopy;;   Stenosis surgery (other)  1960s   hx of pyloric stenosis as infant    There were no vitals filed for this visit.   Subjective Assessment - 04/15/21  0759     Subjective Pt reports that he is doing well today. No resting dizziness upon arrival. He has not been driving on the highway over the last week however even on side roads driving at relatively high speeds he has not had any symptoms. He has been performing his HEP and believes that it is helping. No specific questions upon arrival.    Pertinent History Pt reports that he feels "off balance" when driving at high speeds on the highway for the last two years. "I've never had anxiety but this is starting to create anxiety." He reports that his anxiety first started two years ago when he was first diagnosed with HTN (started taking losartan). He thought that the dizziness might be related to his vision so he saw his eye doctor approximately 8 months ago but no significant findings. He does wear glasses intermittently including when driving. He was also concerned that the dizziness might be related to medication side effects so he started to wean off his medications. However, tapering off his medications did not help his symptoms. He was taking Buspar for his anxiety in the past however this was contributing to his GERD so he stopped taking it. He has tried sertraline in the past however this also caused GI upset so it was discontinued. It was suggested that maybe he try another SSRI however he would prefer  not to go on any other prescription medications. He started taking hemp oil two days ago at the recommendation of a friend to see if it will help with his anxiety. No improvement yet. He has had a brain MRI recently which was WNL. He also saw ENT and his VNG study was WNL. He recently started seeing a counselor to work on his anxiety.    Limitations Other (comment)   Driving high speeds on highway   Diagnostic tests See history    Patient Stated Goals Decrease dizziness    Currently in Pain? No/denies                [facility] PT Assessment - 04/15/21 0833       Standardized Balance Assessment    Standardized Balance Assessment Berg Balance Test      Berg Balance Test   Sit to Stand Able to stand without using hands and stabilize independently    Standing Unsupported Able to stand safely 2 minutes    Sitting with Back Unsupported but Feet Supported on Floor or Stool Able to sit safely and securely 2 minutes    Stand to Sit Sits safely with minimal use of hands    Transfers Able to transfer safely, minor use of hands    Standing Unsupported with Eyes Closed Able to stand 10 seconds safely    Standing Unsupported with Feet Together Able to place feet together independently and stand 1 minute safely    From Standing, Reach Forward with Outstretched Arm Can reach confidently >25 cm (10")    From Standing Position, Pick up Object from Floor Able to pick up shoe safely and easily    From Standing Position, Turn to Look Behind Over each Shoulder Looks behind from both sides and weight shifts well    Turn 360 Degrees Able to turn 360 degrees safely in 4 seconds or less    Standing Unsupported, Alternately Place Feet on Step/Stool Able to stand independently and safely and complete 8 steps in 20 seconds    Standing Unsupported, One Foot in Front Able to place foot tandem independently and hold 30 seconds    Standing on One Leg Able to lift leg independently and hold > 10 seconds    Total Score 56      Functional Gait  Assessment   Gait assessed  Yes    Gait Level Surface Walks 20 ft in less than 5.5 sec, no assistive devices, good speed, no evidence for imbalance, normal gait pattern, deviates no more than 6 in outside of the 12 in walkway width.    Change in Gait Speed Able to smoothly change walking speed without loss of balance or gait deviation. Deviate no more than 6 in outside of the 12 in walkway width.    Gait with Horizontal Head Turns Performs head turns smoothly with no change in gait. Deviates no more than 6 in outside 12 in walkway width    Gait with Vertical Head Turns Performs  head turns with no change in gait. Deviates no more than 6 in outside 12 in walkway width.    Gait and Pivot Turn Pivot turns safely within 3 sec and stops quickly with no loss of balance.    Step Over Obstacle Is able to step over 2 stacked shoe boxes taped together (9 in total height) without changing gait speed. No evidence of imbalance.    Gait with Narrow Base of Support Is able to ambulate for 10 steps heel  to toe with no staggering.    Gait with Eyes Closed Walks 20 ft, uses assistive device, slower speed, mild gait deviations, deviates 6-10 in outside 12 in walkway width. Ambulates 20 ft in less than 9 sec but greater than 7 sec.    Ambulating Backwards Walks 20 ft, no assistive devices, good speed, no evidence for imbalance, normal gait    Steps Alternating feet, no rail.    Total Score 29              TREATMENT   Neuromuscular Re-education    Oculomotor Exam- Fixation Suppressed  Findings Comments  Ocular Alignment normal   Spontaneous Nystagmus normal   Gaze-Holding Nystagmus normal   End-Gaze Nystagmus normal   Head Shaking Nystagmus abnormal Slow R horizontal beating nystagmus which slowly fatigues  Pressure-Induced Nystagmus normal   Hyperventilation Induced Nystagmus normal   Skull Vibration Induced Nystagmus abnormal Slow R horizontal beating nystagmus with vibration on R mastoid and L horizontal beating nystagmus with vibration on L mastoid    BPPV TESTS:  Symptoms Duration Intensity Nystagmus  L Dix-Hallpike None   None  R Dix-Hallpike None   None  L Head Roll None   None  R Head Roll None   None  L Sidelying Test      R Sidelying Test        FUNCTIONAL OUTCOME MEASURES   04/08/21 04/15/21 Comments  BERG NT 56/56  WNL  DGI NT 24/24  WNL  FGA NT 29/30  WNL  FOTO 55 NT Predicted improvement to 57  DHI NT 14/100  Mild self-reported handicap related to dizziness       VOR x 1 horizontal in sitting, plain background target at slightly longer than arms  length, x 60s (1/10 dizziness); VOR x 1 horizontal in standing with feet together, plain background target at slightly longer than arms length, x 60s (2/10 dizziness); VOR x 1 horizontal in standing with feet together, busy background target at slightly longer than arms length, x 60s (2/10 dizziness); Standing with feet together, busy background target at slightly longer than arms length, horizontal and then vertical smooth pursuits x 60s each, no dizziness reported during either exercise;   Updated additional outcome measures with patient. His BERG, DGI, and FGA are all WNL. Negative BPPV testing. [place] is 14/100 indicating mild self-reported disability related to dizziness. Fixation suppression vestibular/oculomotor testing with infrared goggles reveals slow pure horizontal right beating nystagmus after headshake indicating possible L UVH however pt already had normal caloric testing. Progressed VOR x 1 horizontal to narrow stance on busy background. Also added smooth pursuits to HEP as pt reports that he believes that his vision and depth perception may be related to his symptoms. Pt encouraged to follow-up as scheduled. Pt will benefit from PT services to address deficits in dizziness in order to return to full function at home and work without symptoms.                       PT Short Term Goals - 04/09/21 1607       PT SHORT TERM GOAL #1   Title Pt will be independent with HEP in order to decrease dizziness and improve symptom-free function while driving at high speeds on the highway.    Time 4    Period Weeks    Status Not Met    Target Date 05/06/21  PT Long Term Goals - 04/15/21 1143       PT LONG TERM GOAL #1   Title Pt will increase FOTO to at least 57 in order to demonstrate significant improvement in function related to dizziness    Baseline 04/08/21: 55    Time 8    Period Weeks    Status New    Target Date 06/03/21      PT LONG TERM GOAL  #2   Title Pt will report at least 75% improvement in his dizziness symptoms in order to improve his ability to drive on the highway at high speeds to/from work sites    Time 8    Period Weeks    Status New    Target Date 06/03/21      PT LONG TERM GOAL #3   Title Pt will decrease DHI score to 0/100 points in order to demonstrate clinically significant reduction in disability related to dizziness    Baseline 04/08/21: To complete at next visit; 04/15/21: 14/100    Time 8    Period Weeks    Status New    Target Date 06/03/21                   Plan - 04/15/21 0800     Clinical Impression Statement Updated additional outcome measures with patient. His BERG, DGI, and FGA are all WNL. Negative BPPV testing. [place] is 14/100 indicating mild self-reported disability related to dizziness. Fixation suppression vestibular/oculomotor testing with infrared goggles reveals slow pure horizontal right beating nystagmus after headshake indicating possible L UVH however pt already had normal caloric testing. Progressed VOR x 1 horizontal to narrow stance on busy background. Also added smooth pursuits to HEP as pt reports that he believes that his vision and depth perception may be related to his symptoms. Pt encouraged to follow-up as scheduled. Pt will benefit from PT services to address deficits in dizziness in order to return to full function at home and work without symptoms.    Personal Factors and Comorbidities Comorbidity 1;Time since onset of injury/illness/exacerbation;Past/Current Experience    Comorbidities Panic disorder    Examination-Activity Limitations --   Travel   Examination-Participation Restrictions Driving;Occupation   Limits abiltity to travel to/from job sites   Stability/Clinical Decision Making Unstable/Unpredictable    Rehab Potential Good    PT Frequency 1x / week    PT Duration 8 weeks    PT Treatment/Interventions ADLs/Self Care Home Management;Aquatic  Therapy;Biofeedback;Canalith Repostioning;Cryotherapy;Electrical Stimulation;Iontophoresis 89m/ml Dexamethasone;Moist Heat;Traction;Ultrasound;Gait training;Stair training;Therapeutic activities;Therapeutic exercise;Balance training;Neuromuscular re-education;Patient/family education;Manual techniques;Passive range of motion;Dry needling;Vestibular;Visual/perceptual remediation/compensation;Spinal Manipulations;Joint Manipulations    PT Next Visit Plan Progress adaptation (try VOR x 1 vertical, VOR x 2) and habituation exercises    PT Home Exercise Plan Access Code: [email or web address]    Consulted and Agree with Plan of Care Patient             Patient will benefit from skilled therapeutic intervention in order to improve the following deficits and impairments:  Dizziness  Visit Diagnosis: Dizziness and giddiness     Problem List Patient Active Problem List   Diagnosis Date Noted   Vertigo 01/09/2021   Snoring 08/14/2020   Dizziness 03/11/2020   Bilateral knee pain 06/12/2019   Special screening for malignant neoplasms, colon    Polyp of sigmoid colon    Situational anxiety 08/31/2018   Hypertension, essential, benign 08/15/2018   Moderate persistent allergic asthma 03/02/2017   Chronic low back pain with sciatica 03/02/2017  Left ureteral stone 08/07/2015   Hydronephrosis, left 08/07/2015   BPH with obstruction/lower urinary tract symptoms 08/07/2015   Allergic sinusitis 02/03/2007   Gastroesophageal reflux disease 01/11/2007   Phillips Grout PT, DPT, GCS  Jonisha Kindig, PT 04/15/2021, 11:45 AM  [facility] [address] [place], [place], [address] Phone: [phone]   Fax:  [phone]  Name: LINDBERGH WINKLES MRN: [id number] Date of Birth: 12/10/1965

## 2021-04-16 MED ORDER — LOSARTAN POTASSIUM 50 MG PO TABS: 25.0000 mg | ORAL_TABLET | Freq: Every day | ORAL | 1 refills | Status: DC

## 2021-04-16 NOTE — Telephone Encounter (Signed)
Spoke with Mr. Zinn.  He states he has not even started the Amlodipine yet.  He does not need a refill at this time but does need a refill on his Losartan.  Refill sent as requested.

## 2021-04-20 ENCOUNTER — Ambulatory Visit: Payer: 59

## 2021-04-22 ENCOUNTER — Ambulatory Visit: Payer: 59

## 2021-04-22 ENCOUNTER — Other Ambulatory Visit: Payer: Self-pay

## 2021-04-22 DIAGNOSIS — R42 Dizziness and giddiness: Secondary | ICD-10-CM | POA: Diagnosis not present

## 2021-04-22 NOTE — Patient Instructions (Signed)
Access Code: [email or web address] URL: [email or web address] Date: 04/22/2021 Prepared by: Roxana Hires  Exercises Standing Gaze Stabilization with Head Nod - 4 x daily - 7 x weekly - 2 reps - 60s hold Seated Gaze Stabilization with Head Rotation and Horizontal Arm Movement - 4 x daily - 7 x weekly - 2 reps - 60s hold Romberg Stance with Head Nods on Foam Pad - 1 x daily - 7 x weekly - 3 reps - 60s hold

## 2021-04-22 NOTE — Therapy (Signed)
[facility] [address] [place], [place], [address] Phone: [phone]   Fax:  [phone]  Physical Therapy Treatment  Patient Details  Name: Blake Molina MRN: [id number] Date of Birth: December 11, 1965 Referring Provider (PT): Dr. Carloyn Manner   Encounter Date: 04/22/2021   PT End of Session - 04/22/21 0808     Visit Number 3    Number of Visits 9    Date for PT Re-Evaluation 06/03/21    Authorization Type eval: 04/08/21    PT Start Time 0804    PT Stop Time 0845    PT Time Calculation (min) 41 min    Equipment Utilized During Treatment Gait belt    Activity Tolerance Patient tolerated treatment well    Behavior During Therapy WFL for tasks assessed/performed             Past Medical History:  Diagnosis Date   Allergic rhinitis, cause unspecified    Asthma    BPH without obstruction/lower urinary tract symptoms    Congenital hypertrophic pyloric stenosis    Esophageal reflux    Headache    sinus   Heart burn    History of kidney stones    Hypertension    Other chest pain    Panic disorder without agoraphobia    Unspecified asthma(493.90)     Past Surgical History:  Procedure Laterality Date   COLONOSCOPY WITH PROPOFOL N/A 01/22/2019   Procedure: COLONOSCOPY WITH PROPOFOL;  Surgeon: Lucilla Lame, MD;  Location: [place];  Service: Endoscopy;  Laterality: N/A;   ESOPHAGOGASTRODUODENOSCOPY (EGD) WITH PROPOFOL N/A 06/26/2020   Procedure: ESOPHAGOGASTRODUODENOSCOPY (EGD) WITH PROPOFOL;  Surgeon: Lucilla Lame, MD;  Location: [place];  Service: Endoscopy;  Laterality: N/A;   herniated disc-back surgery  1996   POLYPECTOMY  01/22/2019   Procedure: POLYPECTOMY;  Surgeon: Lucilla Lame, MD;  Location: [place];  Service: Endoscopy;;   Stenosis surgery (other)  1960s   hx of pyloric stenosis as infant    There were no vitals filed for this visit.   Subjective Assessment - 04/22/21  0805     Subjective Pt reports that he is doing well today. No resting dizziness upon arrival. He has not had any further episodes however also has not been driving on the highway recently. He is starting a job today [place] He has been performing his HEP. No specific questions upon arrival.    Pertinent History Pt reports that he feels "off balance" when driving at high speeds on the highway for the last two years. "I've never had anxiety but this is starting to create anxiety." He reports that his anxiety first started two years ago when he was first diagnosed with HTN (started taking losartan). He thought that the dizziness might be related to his vision so he saw his eye doctor approximately 8 months ago but no significant findings. He does wear glasses intermittently including when driving. He was also concerned that the dizziness might be related to medication side effects so he started to wean off his medications. However, tapering off his medications did not help his symptoms. He was taking Buspar for his anxiety in the past however this was contributing to his GERD so he stopped taking it. He has tried sertraline in the past however this also caused GI upset so it was discontinued. It was suggested that maybe he try another SSRI however he would  prefer not to go on any other prescription medications. He started taking hemp oil two days ago at the recommendation of a friend to see if it will help with his anxiety. No improvement yet. He has had a brain MRI recently which was WNL. He also saw ENT and his VNG study was WNL. He recently started seeing a counselor to work on his anxiety.    Limitations Other (comment)   Driving high speeds on highway   Diagnostic tests See history    Patient Stated Goals Decrease dizziness    Currently in Pain? No/denies                   TREATMENT   Neuromuscular Re-education   NuStep L2 x 5 minutes for  warm-up during interval history (2 minutes unbilled); VOR x 1 horizontal in standing on [place] with feet together, busy background, target at slightly longer than arms length, x 60s (3-4/10 dizziness); VOR x 1 vertical in standing on [place] with feet together, busy background, target at slightly longer than arms length, 2 x 60s (4/10 dizziness); VOR x 1 horizontal during forward and retro ambulation 2 x 70' each direction (4/10 dizziness); Forward/retro ambulation with lateral ball tosses to therapist with head/eye follow x 70' each both directions; Forward/retro ambulation with vertical ball toss to self with head/eye follow 2 x 70' each direction; Single body rolls on wall with eyes open x 2 bouts each direction; Single body rolls on wall with eyes closed x 2 bouts each direction; Double body rolls on wall with eyes open x 2 bouts each direction; Double body rolls on wall with eyes closed x 2 bouts each direction; Airex feet together vertical ball tosses to self with head/eye follow 2 x 60s (4/10 dizziness); Forward ambulation on treadmill (1.5-1.8 mph) watching vertical as well as horizontal optokinetic stripes (up, down, right, and left) at varying speeds, no report of dizziness; Forward ambulation on treadmill (1.5-1.8 mph) watching a first person video of highway driving, no report of dizziness; HEP updated;   Patient demonstrates excellent motivation during session today.  Progress gaze stabilization exercises and updated on HEP.  Introduced additional habituation exercises with dynamic head turning movements and eye tracking.  Performed ambulation on treadmill with optokinetic stripes at varying speeds in different directions without any provocation of dizziness.  Also performed ambulation on treadmill while watching a first person video of fast highway driving without any incidence of dizziness.  Pt encouraged to follow-up as scheduled and perform updated HEP. He will benefit from PT services  to address deficits in dizziness in order to return to full function at home and work without symptoms.                       PT Short Term Goals - 04/09/21 1607       PT SHORT TERM GOAL #1   Title Pt will be independent with HEP in order to decrease dizziness and improve symptom-free function while driving at high speeds on the highway.    Time 4    Period Weeks    Status Not Met    Target Date 05/06/21               PT Long Term Goals - 04/15/21 1143       PT LONG TERM GOAL #1   Title Pt will increase FOTO to at least 57 in order to demonstrate significant improvement in function related to dizziness  Baseline 04/08/21: 55    Time 8    Period Weeks    Status New    Target Date 06/03/21      PT LONG TERM GOAL #2   Title Pt will report at least 75% improvement in his dizziness symptoms in order to improve his ability to drive on the highway at high speeds to/from work sites    Time 8    Period Weeks    Status New    Target Date 06/03/21      PT LONG TERM GOAL #3   Title Pt will decrease DHI score to 0/100 points in order to demonstrate clinically significant reduction in disability related to dizziness    Baseline 04/08/21: To complete at next visit; 04/15/21: 14/100    Time 8    Period Weeks    Status New    Target Date 06/03/21                   Plan - 04/22/21 0808     Clinical Impression Statement Patient demonstrates excellent motivation during session today.  Progress gaze stabilization exercises and updated on HEP.  Introduced additional habituation exercises with dynamic head turning movements and eye tracking.  Performed ambulation on treadmill with optokinetic stripes at varying speeds in different directions without any provocation of dizziness.  Also performed ambulation on treadmill while watching a first person video of fast highway driving without any incidence of dizziness.  Pt encouraged to follow-up as scheduled and  perform updated HEP. He will benefit from PT services to address deficits in dizziness in order to return to full function at home and work without symptoms.    Personal Factors and Comorbidities Comorbidity 1;Time since onset of injury/illness/exacerbation;Past/Current Experience    Comorbidities Panic disorder    Examination-Activity Limitations --   Travel   Examination-Participation Restrictions Driving;Occupation   Limits abiltity to travel to/from job sites   Stability/Clinical Decision Making Unstable/Unpredictable    Rehab Potential Good    PT Frequency 1x / week    PT Duration 8 weeks    PT Treatment/Interventions ADLs/Self Care Home Management;Aquatic Therapy;Biofeedback;Canalith Repostioning;Cryotherapy;Electrical Stimulation;Iontophoresis 16m/ml Dexamethasone;Moist Heat;Traction;Ultrasound;Gait training;Stair training;Therapeutic activities;Therapeutic exercise;Balance training;Neuromuscular re-education;Patient/family education;Manual techniques;Passive range of motion;Dry needling;Vestibular;Visual/perceptual remediation/compensation;Spinal Manipulations;Joint Manipulations    PT Next Visit Plan Progress adaptation (try VOR x 1 vertical, VOR x 2) and habituation exercises    PT Home Exercise Plan Access Code: [email or web address]   Consulted and Agree with Plan of Care Patient             Patient will benefit from skilled therapeutic intervention in order to improve the following deficits and impairments:  Dizziness  Visit Diagnosis: Dizziness and giddiness     Problem List Patient Active Problem List   Diagnosis Date Noted   Vertigo 01/09/2021   Snoring 08/14/2020   Dizziness 03/11/2020   Bilateral knee pain 06/12/2019   Special screening for malignant neoplasms, colon    Polyp of sigmoid colon    Situational anxiety 08/31/2018   Hypertension, essential, benign 08/15/2018   Moderate persistent allergic asthma 03/02/2017   Chronic low back pain with sciatica 03/02/2017    Left ureteral stone 08/07/2015   Hydronephrosis, left 08/07/2015   BPH with obstruction/lower urinary tract symptoms 08/07/2015   Allergic sinusitis 02/03/2007   Gastroesophageal reflux disease 01/11/2007   JPhillips GroutPT, DPT, GCS  Tramya Schoenfelder, PT 04/22/2021, 12:09 PM  [facility] [address] [place],  [address] Phone: [phone]   Fax:  [phone]  Name: Blake Molina MRN: [id number] Date of Birth: Mar 26, 1966

## 2021-04-27 ENCOUNTER — Ambulatory Visit: Payer: 59

## 2021-04-29 ENCOUNTER — Ambulatory Visit: Payer: 59

## 2021-04-29 ENCOUNTER — Other Ambulatory Visit: Payer: Self-pay

## 2021-04-29 DIAGNOSIS — R42 Dizziness and giddiness: Secondary | ICD-10-CM

## 2021-04-29 NOTE — Patient Instructions (Signed)
Access Code: [email or web address] URL: [email or web address] Date: 04/29/2021 Prepared by: Roxana Hires  Exercises Standing Gaze Stabilization with Head Nod - 4 x daily - 7 x weekly - 2 reps - 60s hold Seated Gaze Stabilization with Head Rotation and Horizontal Arm Movement - 4 x daily - 7 x weekly - 2 reps - 60s hold Romberg Stance with Head Nods on Foam Pad - 1 x daily - 7 x weekly - 3 reps - 60s hold Pencil Pushups - 2 x daily - 7 x weekly - 3 reps - 60s hold

## 2021-04-29 NOTE — Therapy (Signed)
[facility] [address] [place], [place], [address] Phone: [phone]   Fax:  [phone]  Physical Therapy Treatment  Patient Details  Name: Blake Molina MRN: [id number] Date of Birth: 1965-07-01 Referring Provider (PT): Dr. Carloyn Manner   Encounter Date: 04/29/2021   PT End of Session - 04/29/21 1122     Visit Number 4    Number of Visits 9    Date for PT Re-Evaluation 06/03/21    Authorization Type eval: 04/08/21    PT Start Time 0804    PT Stop Time 0845    PT Time Calculation (min) 41 min    Equipment Utilized During Treatment Gait belt    Activity Tolerance Patient tolerated treatment well    Behavior During Therapy WFL for tasks assessed/performed              Past Medical History:  Diagnosis Date   Allergic rhinitis, cause unspecified    Asthma    BPH without obstruction/lower urinary tract symptoms    Congenital hypertrophic pyloric stenosis    Esophageal reflux    Headache    sinus   Heart burn    History of kidney stones    Hypertension    Other chest pain    Panic disorder without agoraphobia    Unspecified asthma(493.90)     Past Surgical History:  Procedure Laterality Date   COLONOSCOPY WITH PROPOFOL N/A 01/22/2019   Procedure: COLONOSCOPY WITH PROPOFOL;  Surgeon: Lucilla Lame, MD;  Location: [place];  Service: Endoscopy;  Laterality: N/A;   ESOPHAGOGASTRODUODENOSCOPY (EGD) WITH PROPOFOL N/A 06/26/2020   Procedure: ESOPHAGOGASTRODUODENOSCOPY (EGD) WITH PROPOFOL;  Surgeon: Lucilla Lame, MD;  Location: [place];  Service: Endoscopy;  Laterality: N/A;   herniated disc-back surgery  1996   POLYPECTOMY  01/22/2019   Procedure: POLYPECTOMY;  Surgeon: Lucilla Lame, MD;  Location: [place];  Service: Endoscopy;;   Stenosis surgery (other)  1960s   hx of pyloric stenosis as infant    There were no vitals filed for this visit.   Subjective Assessment -  04/29/21 0811     Subjective Pt reports that he is doing well today. No resting dizziness upon arrival. [place] times without symptoms. However he did have some episodes across the bridge where he had dizziness. He has had multiple days with significant sinus pressure. He made an appointment with ENT to discuss his sinus pain. He has been performing his HEP. No specific questions upon arrival.    Pertinent History Pt reports that he feels "off balance" when driving at high speeds on the highway for the last two years. "I've never had anxiety but this is starting to create anxiety." He reports that his anxiety first started two years ago when he was first diagnosed with HTN (started taking losartan). He thought that the dizziness might be related to his vision so he saw his eye doctor approximately 8 months ago but no significant findings. He does wear glasses intermittently including when driving. He was also concerned that the dizziness might be related to medication side effects so he started to wean off his medications. However, tapering off his medications did not help his symptoms. He was taking Buspar for his anxiety in the past however this was contributing to his GERD so he stopped taking it. He has tried sertraline in the past  however this also caused GI upset so it was discontinued. It was suggested that maybe he try another SSRI however he would prefer not to go on any other prescription medications. He started taking hemp oil two days ago at the recommendation of a friend to see if it will help with his anxiety. No improvement yet. He has had a brain MRI recently which was WNL. He also saw ENT and his VNG study was WNL. He recently started seeing a counselor to work on his anxiety.    Limitations Other (comment)   Driving high speeds on highway   Diagnostic tests See history    Patient Stated Goals Decrease dizziness                TREATMENT   Neuromuscular Re-education   NuStep L2 x 5 minutes for warm-up during interval history (3 minutes unbilled); VOR x 1 horizontal during forward and retro ambulation 2 x 70' each direction (2/10 dizziness); VOR x 1 vertical during forward and retro ambulation 2 x 70' each direction (5/10 dizziness); Forward/retro ambulation with lateral ball tosses to therapist with head/eye follow x 70' each both directions; Forward/retro ambulation with vertical ball toss to self with head/eye follow 2 x 70' each direction; Retro ambulation with ball pass around body to therapist with return pass on opposite side and pt following with head/eye x 70' toward each direction; Double body rolls on wall with eyes closed x 1 bouts each direction; Double body rolls off wall with eyes closed x 2 bouts each direction; Brock string convergence/divergence training with three beads (15", 4", 9") with 2-3s focus on each bead 3 x 60s; Forward ambulation on treadmill (1.5-1.8 mph) watching a dashboard (first person view) video of highway driving x 4 minutes; HEP updated to include pencil push-ups;   Patient demonstrates excellent motivation during session today.  Progressed gaze stabilization exercises to VOR x 1 vertical during ambulation which causes more dizziness than horizontal head turns. Continued additional habituation exercises with dynamic head turning movements and eye tracking. Pt reports difficulty with convergence/divergence so performed Brock string training during session and pt reports slight reproduction of his anxiety. Issued pencil push-ups for HEP.  Pt encouraged to follow-up as scheduled and perform updated HEP. He will benefit from PT services to address deficits in dizziness in order to return to full function at home and work without symptoms.                       PT Short Term Goals - 04/09/21 1607       PT SHORT TERM GOAL #1   Title Pt will be  independent with HEP in order to decrease dizziness and improve symptom-free function while driving at high speeds on the highway.    Time 4    Period Weeks    Status Not Met    Target Date 05/06/21               PT Long Term Goals - 04/15/21 1143       PT LONG TERM GOAL #1   Title Pt will increase FOTO to at least 57 in order to demonstrate significant improvement in function related to dizziness    Baseline 04/08/21: 55    Time 8    Period Weeks    Status New    Target Date 06/03/21      PT LONG TERM GOAL #2   Title Pt will report at least 75% improvement  in his dizziness symptoms in order to improve his ability to drive on the highway at high speeds to/from work sites    Time [place]    Target Date 06/03/21      PT LONG TERM GOAL #3   Title Pt will decrease DHI score to 0/100 points in order to demonstrate clinically significant reduction in disability related to dizziness    Baseline 04/08/21: To complete at next visit; 04/15/21: 14/100    Time 8    Period Weeks    Status New    Target Date 06/03/21                   Plan - 04/29/21 0843     Clinical Impression Statement Patient demonstrates excellent motivation during session today.  Progressed gaze stabilization exercises to VOR x 1 vertical during ambulation which causes more dizziness than horizontal head turns. Continued additional habituation exercises with dynamic head turning movements and eye tracking. Pt reports difficulty with convergence/divergence so performed Brock string training during session and pt reports slight reproduction of his anxiety. Issued pencil push-ups for HEP.  Pt encouraged to follow-up as scheduled and perform updated HEP. He will benefit from PT services to address deficits in dizziness in order to return to full function at home and work without symptoms.    Personal Factors and Comorbidities Comorbidity 1;Time since onset of  injury/illness/exacerbation;Past/Current Experience    Comorbidities Panic disorder    Examination-Activity Limitations --   Travel   Examination-Participation Restrictions Driving;Occupation   Limits abiltity to travel to/from job sites   Stability/Clinical Decision Making Unstable/Unpredictable    Rehab Potential Good    PT Frequency 1x / week    PT Duration 8 weeks    PT Treatment/Interventions ADLs/Self Care Home Management;Aquatic Therapy;Biofeedback;Canalith Repostioning;Cryotherapy;Electrical Stimulation;Iontophoresis 41m/ml Dexamethasone;Moist Heat;Traction;Ultrasound;Gait training;Stair training;Therapeutic activities;Therapeutic exercise;Balance training;Neuromuscular re-education;Patient/family education;Manual techniques;Passive range of motion;Dry needling;Vestibular;Visual/perceptual remediation/compensation;Spinal Manipulations;Joint Manipulations    PT Next Visit Plan Progress adaptation (try VOR x 1 vertical, VOR x 2) and habituation exercises    PT Home Exercise Plan Access Code: [email or web address]   Consulted and Agree with Plan of Care Patient              Patient will benefit from skilled therapeutic intervention in order to improve the following deficits and impairments:  Dizziness  Visit Diagnosis: Dizziness and giddiness     Problem List Patient Active Problem List   Diagnosis Date Noted   Vertigo 01/09/2021   Snoring 08/14/2020   Dizziness 03/11/2020   Bilateral knee pain 06/12/2019   Special screening for malignant neoplasms, colon    Polyp of sigmoid colon    Situational anxiety 08/31/2018   Hypertension, essential, benign 08/15/2018   Moderate persistent allergic asthma 03/02/2017   Chronic low back pain with sciatica 03/02/2017   Left ureteral stone 08/07/2015   Hydronephrosis, left 08/07/2015   BPH with obstruction/lower urinary tract symptoms 08/07/2015   Allergic sinusitis 02/03/2007   Gastroesophageal reflux disease 01/11/2007   JPhillips Grout PT, DPT, GCS  Oddie Kuhlmann, PT 04/29/2021, 11:28 AM  [place] [facility] [place] [address]Phone: [phone]  Fax:  [phone] Name: JKENTAVIUS DETTOREMRN: [id number] of Birth: [birth year]

## 2021-05-04 ENCOUNTER — Ambulatory Visit: Payer: 59

## 2021-05-05 ENCOUNTER — Ambulatory Visit: Payer: 59

## 2021-05-11 ENCOUNTER — Ambulatory Visit: Payer: 59

## 2021-05-13 ENCOUNTER — Ambulatory Visit: Payer: 59

## 2021-05-15 ENCOUNTER — Other Ambulatory Visit: Payer: Self-pay | Admitting: Family Medicine

## 2021-05-15 DIAGNOSIS — I10 Essential (primary) hypertension: Secondary | ICD-10-CM

## 2021-05-20 ENCOUNTER — Ambulatory Visit: Payer: 59

## 2021-05-27 ENCOUNTER — Ambulatory Visit: Payer: 59 | Attending: Otolaryngology

## 2021-05-27 ENCOUNTER — Other Ambulatory Visit: Payer: Self-pay

## 2021-05-27 DIAGNOSIS — R42 Dizziness and giddiness: Secondary | ICD-10-CM | POA: Diagnosis not present

## 2021-05-27 NOTE — Therapy (Signed)
[facility] [address] [place], [place], [address] Phone: [phone]   Fax:  [phone]  Physical Therapy Treatment/Discharge  Patient Details  Name: Blake Molina MRN: [id number] Date of Birth: 23-Dec-1965 Referring Provider (PT): Dr. Carloyn Manner   Encounter Date: 05/27/2021   PT End of Session - 05/27/21 0759     Visit Number 5    Number of Visits 9    Date for PT Re-Evaluation 06/03/21    Authorization Type eval: 04/08/21    PT Start Time 0758    PT Stop Time 0845    PT Time Calculation (min) 47 min    Equipment Utilized During Treatment Gait belt    Activity Tolerance Patient tolerated treatment well    Behavior During Therapy [facility] for tasks assessed/performed              Past Medical History:  Diagnosis Date   Allergic rhinitis, cause unspecified    Asthma    BPH without obstruction/lower urinary tract symptoms    Congenital hypertrophic pyloric stenosis    Esophageal reflux    Headache    sinus   Heart burn    History of kidney stones    Hypertension    Other chest pain    Panic disorder without agoraphobia    Unspecified asthma(493.90)     Past Surgical History:  Procedure Laterality Date   COLONOSCOPY WITH PROPOFOL N/A 01/22/2019   Procedure: COLONOSCOPY WITH PROPOFOL;  Surgeon: Lucilla Lame, MD;  Location: [place];  Service: Endoscopy;  Laterality: N/A;   ESOPHAGOGASTRODUODENOSCOPY (EGD) WITH PROPOFOL N/A 06/26/2020   Procedure: ESOPHAGOGASTRODUODENOSCOPY (EGD) WITH PROPOFOL;  Surgeon: Lucilla Lame, MD;  Location: [place];  Service: Endoscopy;  Laterality: N/A;   herniated disc-back surgery  1996   POLYPECTOMY  01/22/2019   Procedure: POLYPECTOMY;  Surgeon: Lucilla Lame, MD;  Location: [place];  Service: Endoscopy;;   Stenosis surgery (other)  1960s   hx of pyloric stenosis as infant    There were no vitals filed for this visit.   Subjective Assessment  - 05/27/21 0758     Subjective Pt reports that he is doing well today. No resting dizziness upon arrival. He has been performing his HEP intermittently however has been sick over the last couple weeks. He saw ENT who prescribed him a scopalamine patch but he hasn't tried it yet. He has had a few episodes of dizziness since his last therapy session. No specific questions upon arrival.    Pertinent History Pt reports that he feels "off balance" when driving at high speeds on the highway for the last two years. "I've never had anxiety but this is starting to create anxiety." He reports that his anxiety first started two years ago when he was first diagnosed with HTN (started taking losartan). He thought that the dizziness might be related to his vision so he saw his eye doctor approximately 8 months ago but no significant findings. He does wear glasses intermittently including when driving. He was also concerned that the dizziness might be related to medication side effects so he started to wean off his medications. However, tapering off his medications did not help his symptoms. He was taking Buspar for his anxiety in the past however this was contributing to his GERD so he stopped taking it. He has tried sertraline in the past however this also caused GI upset so it was discontinued. It was suggested that maybe he try another  SSRI however he would prefer not to go on any other prescription medications. He started taking hemp oil two days ago at the recommendation of a friend to see if it will help with his anxiety. No improvement yet. He has had a brain MRI recently which was WNL. He also saw ENT and his VNG study was WNL. He recently started seeing a counselor to work on his anxiety.    Limitations Other (comment)   Driving high speeds on highway   Diagnostic tests See history    Patient Stated Goals Decrease dizziness    Currently in Pain? No/denies               TREATMENT   Neuromuscular  Re-education  Interim history obtained; Updated outcome measures and goals with patient: DHI: 10/100; FOTO: 55;  Utilized VR headset with patient sitting to simulate highway driving, crossing a bridge, optokinetic stripes, and a roller coaster to see if it induces symptoms of dizziness. Pt experienced an equivocal amount of dizziness while viewing the roller coaster simulation. He started to get dizzy and his palms began to sweat. Talked with patient about possibly using a virtual reality system for gradual habituation training in a safe environment.   Forward/retro ambulation with lateral ball tosses to therapist with head/eye follow x 70' each both directions; Forward/retro ambulation with vertical ball toss to self with head/eye follow 2 x 70' each direction; Discharge instructions provided;   Patient demonstrates excellent motivation during session today. Updated outcome measures with patient. His FOTO remained unchanged at 82. Her [place] decreased slightly from 14/100 to 10/100. He reports no change in his symptoms since starting with therapy however prior to the last 2 weeks off he was reporting some improvement in his symptoms. Utilized Designer, multimedia with patient in sitting to simulate highway driving, crossing a bridge, optokinetic stripes, and a rollercoaster to see if it induces symptoms of dizziness. Pt experienced an equivocal amount of dizziness with diaphoresis while viewing the rollercoaster simulation. Talked with patient about possibly using a virtual reality system at home for gradual habituation training. This was the first time that therapy was able to truly reproduce his symptoms. Pt agreed to discharge on this date as he wants to try the scopolamine patch and the CBD oil. He was encouraged to contact therapy if he would like to return.                       PT Short Term Goals - 05/27/21 1155       PT SHORT TERM GOAL #1   Title Pt will be independent  with HEP in order to decrease dizziness and improve symptom-free function while driving at high speeds on the highway.    Time 4    Period Weeks    Status Achieved    Target Date 05/06/21               PT Long Term Goals - 05/27/21 0810       PT LONG TERM GOAL #1   Title Pt will increase FOTO to at least 57 in order to demonstrate significant improvement in function related to dizziness    Baseline 04/08/21: 55; 05/27/21: 55    Time 8    Period Weeks    Status Not Met    Target Date --      PT LONG TERM GOAL #2   Title Pt will report at least 75% improvement in his dizziness symptoms  in order to improve his ability to drive on the highway at high speeds to/from work sites    Baseline 05/27/21: No change    Time 8    Period Weeks    Status Not Met    Target Date --      PT LONG TERM GOAL #3   Title Pt will decrease DHI score to 0/100 points in order to demonstrate clinically significant reduction in disability related to dizziness    Baseline 04/08/21: To complete at next visit; 04/15/21: 14/100; 05/27/21: 10/100    Time 8    Period Weeks    Status Partially Met    Target Date --                   Plan - 05/27/21 0800     Clinical Impression Statement Patient demonstrates excellent motivation during session today. Updated outcome measures with patient. His FOTO remained unchanged at 71. Her [place] decreased slightly from 14/100 to 10/100. He reports no change in his symptoms since starting with therapy however prior to the last 2 weeks off he was reporting some improvement in his symptoms. Utilized Designer, multimedia with patient in sitting to simulate highway driving, crossing a bridge, optokinetic stripes, and a rollercoaster to see if it induces symptoms of dizziness. Pt experienced an equivocal amount of dizziness with diaphoresis while viewing the rollercoaster simulation. Talked with patient about possibly using a virtual reality system at home for gradual  habituation training. This was the first time that therapy was able to truly reproduce his symptoms. Pt agreed to discharge on this date as he wants to try the scopolamine patch and the CBD oil. He was encouraged to contact therapy if he would like to return.    Personal Factors and Comorbidities Comorbidity 1;Time since onset of injury/illness/exacerbation;Past/Current Experience    Comorbidities Panic disorder    Examination-Activity Limitations --   Travel   Examination-Participation Restrictions Driving;Occupation   Limits abiltity to travel to/from job sites   Stability/Clinical Decision Making Unstable/Unpredictable    Rehab Potential Good    PT Frequency 1x / week    PT Duration 8 weeks    PT Treatment/Interventions ADLs/Self Care Home Management;Aquatic Therapy;Biofeedback;Canalith Repostioning;Cryotherapy;Electrical Stimulation;Iontophoresis 59m/ml Dexamethasone;Moist Heat;Traction;Ultrasound;Gait training;Stair training;Therapeutic activities;Therapeutic exercise;Balance training;Neuromuscular re-education;Patient/family education;Manual techniques;Passive range of motion;Dry needling;Vestibular;Visual/perceptual remediation/compensation;Spinal Manipulations;Joint Manipulations    PT Next Visit Plan Progress adaptation (try VOR x 1 vertical, VOR x 2) and habituation exercises    PT Home Exercise Plan Access Code: [email or web address]    Consulted and Agree with Plan of Care Patient              Patient will benefit from skilled therapeutic intervention in order to improve the following deficits and impairments:  Dizziness  Visit Diagnosis: Dizziness and giddiness     Problem List Patient Active Problem List   Diagnosis Date Noted   Vertigo 01/09/2021   Snoring 08/14/2020   Dizziness 03/11/2020   Bilateral knee pain 06/12/2019   Special screening for malignant neoplasms, colon    Polyp of sigmoid colon    Situational anxiety 08/31/2018   Hypertension, essential, benign 08/15/2018    Moderate persistent allergic asthma 03/02/2017   Chronic low back pain with sciatica 03/02/2017   Left ureteral stone 08/07/2015   Hydronephrosis, left 08/07/2015   BPH with obstruction/lower urinary tract symptoms 08/07/2015   Allergic sinusitis 02/03/2007   Gastroesophageal reflux disease 01/11/2007   JLyndel SafeHuprich PT, DPT, GCS  Sylvie Mifsud, PT 05/27/2021, 11:59  AM  [facility] [address] [place], [place], [address] Phone: [phone]   Fax:  [phone]  Name: Blake Molina MRN: [id number] Date of Birth: 05-Nov-1965

## 2021-06-03 ENCOUNTER — Ambulatory Visit: Payer: 59

## 2021-06-09 ENCOUNTER — Telehealth: Payer: Self-pay | Admitting: Family Medicine

## 2021-06-09 MED ORDER — ESCITALOPRAM OXALATE 5 MG PO TABS: 5.0000 mg | ORAL_TABLET | Freq: Every day | ORAL | 0 refills | Status: DC

## 2021-06-09 NOTE — Telephone Encounter (Signed)
Spoke with Blake Molina.  He states he has done the vestibular rehab through ENT and he states it didn't help a whole lot.  He states he also talked with therapist as recommended and since Dr. Diona Browner thinks the anxiety causes the dizziness and not vise versa, at this point he is willing to try the Lexapro. Pharmacy [facility] [place].  Please advise.  Patient is aware that Dr. Diona Browner is out of the office this week.

## 2021-06-09 NOTE — Telephone Encounter (Signed)
Pt called in requesting a call back regarding medication concern [phone]

## 2021-06-23 ENCOUNTER — Ambulatory Visit (INDEPENDENT_AMBULATORY_CARE_PROVIDER_SITE_OTHER): Payer: 59 | Admitting: Family

## 2021-06-23 ENCOUNTER — Telehealth: Payer: Self-pay | Admitting: Family

## 2021-06-23 ENCOUNTER — Other Ambulatory Visit: Payer: Self-pay

## 2021-06-23 VITALS — BP 140/90 | HR 84 | Temp 97.0°F | Ht 68.6 in | Wt 174.2 lb

## 2021-06-23 DIAGNOSIS — I10 Essential (primary) hypertension: Secondary | ICD-10-CM

## 2021-06-23 DIAGNOSIS — J3489 Other specified disorders of nose and nasal sinuses: Secondary | ICD-10-CM

## 2021-06-23 DIAGNOSIS — J309 Allergic rhinitis, unspecified: Secondary | ICD-10-CM | POA: Diagnosis not present

## 2021-06-23 DIAGNOSIS — R69 Illness, unspecified: Secondary | ICD-10-CM | POA: Diagnosis not present

## 2021-06-23 LAB — POC COVID19 BINAXNOW: SARS Coronavirus 2 Ag: NEGATIVE

## 2021-06-23 MED ORDER — LOSARTAN POTASSIUM 50 MG PO TABS: 50.0000 mg | ORAL_TABLET | Freq: Every day | ORAL | 1 refills | Status: DC

## 2021-06-23 MED ORDER — MONTELUKAST SODIUM 10 MG PO TABS: 10.0000 mg | ORAL_TABLET | Freq: Every day | ORAL | 1 refills | Status: DC

## 2021-06-23 MED ORDER — FLUTICASONE PROPIONATE 50 MCG/ACT NA SUSP
2.0000 | Freq: Every day | NASAL | 6 refills | Status: AC
Start: 2021-06-23 — End: ?

## 2021-06-23 NOTE — Patient Instructions (Addendum)
Increase losartan to 50 mg, which will be one full tablet of your current dosing instead of 1/2 tablet.  Start monitoring your blood pressure daily, around the same time of day, for the next 1 week and maintain a log. Send me a my chart message with your one week average blood pressure and I will let you know if we need to increase your dosage.  Ensure that you have rested for 30 minutes prior to checking your blood pressure.   Start with both flonase nose spray as well as azelastine on a daily basis, I have also sent a prescription of Singulair to pharmacy to which you can start taking once nightly. Continue symbicort.   Follow up with ENT as scheduled as well.   It was a pleasure seeing you today! Please do not hesitate to reach out with any questions and or concerns.  Regards,   Eugenia Pancoast FNP-C

## 2021-06-23 NOTE — Telephone Encounter (Signed)
Please call pt and ask if he has covid tested? It didn't say in the intake for the visit

## 2021-06-23 NOTE — Progress Notes (Signed)
Established Patient Office Visit  Subjective:  Patient ID: Blake Molina, male    DOB: Oct 12, 1965  Age: 56 y.o. MRN: [id number]  CC:  Chief Complaint  Patient presents with   Nasal Congestion    And constantly clearing throat, with thick mucous x months    Headache    Above eyes     HPI Blake Molina is here today with concerns.  He is really struggling with increased green thick sputum that causes a lot of pnd. Worse in the am and has to cough up. Once he gets moving, starts to subside however always there throughout the day. Does have a cough when he is trying to get stuff out of chest, however no chest congestion. No fever.   Has switched therapies trying claritin, allegra, and or zyrtec no real relief.  He has tried flonase as well as azelastine. Azelastine did help him some, continuing with this currently. He has seen ENT, Dr. Pryor Ochoa, and MRI brain with no acute findings and or sinus indications. He was given ten days of Augmentin BID and completed over tow weeks ago. Did have some improvement with sinus pressure, but came back again. He also was given a dose of steroids, but didn't take quiet yet he is considering taking this.   Today, with maxillary sinus pressure/ worse in the am and pain behind the eyes and on top of the eyes intermittently. Also with some ethmoid sinus pressure.    He is not consistently taking flonase daily, does try to take azelastine nightly. Not currently taking allegra anymore bc it doesn't seem to help that much.   HTN: currently taking losartan 50 mg (1/2 tablet once daily), does state most of the time noted increased blood pressure usually around 140/90. Denies cp palp and or sob. Self d/c his amlodipine as he thought it may be causing dizziness, however didn't choose to restart it either.  Past Medical History:  Diagnosis Date   Allergic rhinitis, cause unspecified    Asthma    BPH without obstruction/lower urinary tract symptoms    Congenital  hypertrophic pyloric stenosis    Esophageal reflux    Headache    sinus   Heart burn    History of kidney stones    Hypertension    Other chest pain    Panic disorder without agoraphobia    Unspecified asthma(493.90)     Past Surgical History:  Procedure Laterality Date   COLONOSCOPY WITH PROPOFOL N/A 01/22/2019   Procedure: COLONOSCOPY WITH PROPOFOL;  Surgeon: Lucilla Lame, MD;  Location: [place];  Service: Endoscopy;  Laterality: N/A;   ESOPHAGOGASTRODUODENOSCOPY (EGD) WITH PROPOFOL N/A 06/26/2020   Procedure: ESOPHAGOGASTRODUODENOSCOPY (EGD) WITH PROPOFOL;  Surgeon: Lucilla Lame, MD;  Location: [place];  Service: Endoscopy;  Laterality: N/A;   herniated disc-back surgery  1996   POLYPECTOMY  01/22/2019   Procedure: POLYPECTOMY;  Surgeon: Lucilla Lame, MD;  Location: [place];  Service: Endoscopy;;   Stenosis surgery (other)  1960s   hx of pyloric stenosis as infant    Family History  Problem Relation Age of Onset   Coronary artery disease Father        stent age 17   Hypertension Father    Allergies Mother    Asthma Brother    Allergies Brother    Hypertension Paternal Grandfather    Kidney disease Neg Hx    Prostate cancer Neg Hx    Kidney cancer Neg Hx  Bladder Cancer Neg Hx     Social History   Socioeconomic History   Marital status: Married    Spouse name: Not on file   Number of children: 3   Years of education: Not on file   Highest education level: Not on file  Occupational History   Occupation: Museum/gallery curator  Tobacco Use   Smoking status: Never   Smokeless tobacco: Never  Vaping Use   Vaping Use: Never used  Substance and Sexual Activity   Alcohol use: Yes    Comment: Occasionally, maybe 2x/month    Drug use: No   Sexual activity: Not on file  Other Topics Concern   Not on file  Social History Narrative   Regularly exercises- walks at work, elliptical. 3 healthy children. Occupation Museum/gallery curator.  Diet: 2 meals, fruits/veggies., 1-2x/week FF         Social Determinants of Health   Financial Resource Strain: Not on file  Food Insecurity: Not on file  Transportation Needs: Not on file  Physical Activity: Not on file  Stress: Not on file  Social Connections: Not on file  Intimate Partner Violence: Not on file    Outpatient Medications Prior to Visit  Medication Sig Dispense Refill   albuterol (VENTOLIN HFA) 108 (90 Base) MCG/ACT inhaler Inhale 1-2 puffs into the lungs every 6 (six) hours as needed for wheezing or shortness of breath. 3 each 1   Azelastine HCl 137 MCG/SPRAY SOLN Place 1-2 sprays into the nose as needed.     dexlansoprazole (DEXILANT) 60 MG capsule Take 1 capsule (60 mg total) by mouth daily. 90 capsule 3   escitalopram (LEXAPRO) 5 MG tablet Take 1 tablet (5 mg total) by mouth daily. 30 tablet 0   fexofenadine (ALLEGRA) 180 MG tablet Take 180 mg by mouth daily.     guaiFENesin (MUCINEX) 600 MG 12 hr tablet Take 600 mg by mouth 2 (two) times daily as needed.     scopolamine (TRANSDERM-SCOP) 1 MG/3DAYS Place 1 patch onto the skin every three (3) days as needed.     Acetaminophen (TYLENOL 8 HOUR PO) Take by mouth.     amLODipine (NORVASC) 5 MG tablet TAKE 1 TABLET (5 MG TOTAL) BY MOUTH DAILY. 30 tablet 2   loratadine-pseudoephedrine (CLARITIN-D 24-HOUR) 10-240 MG 24 hr tablet Take 1 tablet by mouth daily.     losartan (COZAAR) 50 MG tablet Take 0.5 tablets (25 mg total) by mouth daily. 45 tablet 1   No facility-administered medications prior to visit.    Allergies  Allergen Reactions   Buspar [Buspirone] Nausea Only    Reflux    ROS Review of Systems  Constitutional:  Negative for chills and fever.  HENT:  Positive for congestion, ear pain, sinus pressure, sinus pain and sore throat.   Respiratory:  Positive for cough (denies chest congestion, wet productive cough with green sputum). Negative for choking, shortness of breath (with prodcutive green sputum) and  wheezing.   Cardiovascular:  Negative for chest pain and palpitations.     Objective:    Physical Exam Constitutional:      General: He is awake. He is not in acute distress.    Appearance: Normal appearance. He is not ill-appearing.  HENT:     Right Ear: No decreased hearing noted. No drainage, swelling or tenderness. A middle ear effusion (clear effusion) is present. There is no impacted cerumen. Tympanic membrane is not erythematous, retracted or bulging.     Left Ear: No decreased hearing  noted. No drainage, swelling or tenderness. A middle ear effusion (clear effusion) is present. There is no impacted cerumen. Tympanic membrane is not erythematous, retracted or bulging.     Ears:     Comments: Bil ear canals with redness     Nose: Nose normal.     Right Turbinates: Enlarged. Not swollen.     Left Turbinates: Enlarged. Not swollen.     Right Sinus: No maxillary sinus tenderness or frontal sinus tenderness.     Left Sinus: No maxillary sinus tenderness or frontal sinus tenderness.     Comments: Frontal/ethmoid tenderness on palpation     Mouth/Throat:     Mouth: Mucous membranes are moist.     Pharynx: No pharyngeal swelling, oropharyngeal exudate or posterior oropharyngeal erythema.     Comments: [place] throat Eyes:     Extraocular Movements: Extraocular movements intact.     Pupils: Pupils are equal, round, and reactive to light.  Cardiovascular:     Rate and Rhythm: Normal rate and regular rhythm.  Pulmonary:     Effort: Pulmonary effort is normal.     Breath sounds: Normal breath sounds. No wheezing.  Neurological:     Mental Status: He is alert.    BP 140/90    Pulse 84    Temp (!) 97 F (36.1 C) (Temporal)    Ht 5' 8.6" (1.742 m)    Wt 174 lb 4 oz (79 kg)    SpO2 97%    BMI 26.03 kg/m  Wt Readings from Last 3 Encounters:  06/23/21 174 lb 4 oz (79 kg)  03/31/21 168 lb 4 oz (76.3 kg)  01/09/21 163 lb 4 oz (74 kg)     Health Maintenance Due  Topic Date Due    Pneumococcal Vaccine 52-38 Years old (1 - PCV) Never done   Zoster Vaccines- Shingrix (1 of 2) Never done   INFLUENZA VACCINE  Never done    There are no preventive care reminders to display for this patient.  Lab Results  Component Value Date   TSH 0.82 08/15/2018   Lab Results  Component Value Date   WBC 5.3 09/23/2020   HGB 14.3 09/23/2020   HCT 42.8 09/23/2020   MCV 88 09/23/2020   PLT 311 09/23/2020   Lab Results  Component Value Date   NA 140 09/23/2020   K 4.5 09/23/2020   CO2 22 09/23/2020   GLUCOSE 92 09/23/2020   BUN 13 09/23/2020   CREATININE 1.04 09/23/2020   BILITOT 0.5 09/23/2020   ALKPHOS 48 09/23/2020   AST 20 09/23/2020   ALT 13 09/23/2020   PROT 6.8 09/23/2020   ALBUMIN 4.5 09/23/2020   CALCIUM 9.9 09/23/2020   ANIONGAP 9 08/31/2019   EGFR 85 09/23/2020   GFR 81.20 08/15/2018   No results found for: HGBA1C    Assessment & Plan:   Problem List Items Addressed This Visit       Cardiovascular and Mediastinum   Hypertension, essential, benign    Due to elevated blood pressure of visit as well at home I have advised patient to increase losartan to 50 mg and to take once daily.  Advised patient to monitor blood pressure at home daily with feet flat on the floor prior to taking and resting for at least 5 to 10 minutes prior.  Have patient record blood pressure and return to follow-up visit with log in hand to review.  Watch a low-sodium diet as well      Relevant  Medications   losartan (COZAAR) 50 MG tablet     Respiratory   Allergic sinusitis    At this time I will add Singulair to the plan of care as well as recommend the patient continue to take Flonase once daily consistently.  Continue with azelastine as well.  If no noted improvement advised patient to follow back up with ENT.      Relevant Medications   guaiFENesin (MUCINEX) 600 MG 12 hr tablet   fluticasone (FLONASE) 50 MCG/ACT nasal spray     Other   Nasal and sinus discharge -  Primary   Relevant Medications   fluticasone (FLONASE) 50 MCG/ACT nasal spray   montelukast (SINGULAIR) 10 MG tablet   Other Relevant Orders   POC COVID-19 (Completed)    Meds ordered this encounter  Medications   fluticasone (FLONASE) 50 MCG/ACT nasal spray    Sig: Place 2 sprays into both nostrils daily.    Dispense:  16 g    Refill:  6    Order Specific Question:   Supervising Provider    Answer:   BEDSOLE, AMY E [[id number]]   montelukast (SINGULAIR) 10 MG tablet    Sig: Take 1 tablet (10 mg total) by mouth at bedtime.    Dispense:  90 tablet    Refill:  1    Order Specific Question:   Supervising Provider    Answer:   BEDSOLE, AMY E [[id number]]   losartan (COZAAR) 50 MG tablet    Sig: Take 1 tablet (50 mg total) by mouth daily.    Dispense:  30 tablet    Refill:  1    Order Specific Question:   Supervising Provider    Answer:   Diona Browner, AMY E [[id number]]    Follow-up: Return in about 1 month (around 07/24/2021) for with blood pressure log.    Eugenia Pancoast, FNP

## 2021-06-24 ENCOUNTER — Telehealth: Payer: Self-pay | Admitting: *Deleted

## 2021-06-24 DIAGNOSIS — J3489 Other specified disorders of nose and nasal sinuses: Secondary | ICD-10-CM | POA: Insufficient documentation

## 2021-06-24 NOTE — Telephone Encounter (Signed)
Received notification that Mr. Blake Molina had not read his MyChart message sent by Drf. Bedsole on 06/09/2021.  ----- Message -----  From: Jinny Sanders, MD  Sent: 06/09/2021  To: [name]  Subject: Unread Message Notification                     I have sent in a prescription for very low dose Lexapro. It can take up to 4 weeks to start working,so give it time. Also if any minor side effects.. they may resolve after 1 week.  Please make an appt to follow up in 4 weeks... if needed we can increase the dose then.   [name], MD  [place] [facility]   Call patient to relay message.  Patient states he did get the Lexapro but has not started it yet.  Dr. Pryor Ochoa wanted to try him on the Scololamine patch to see if that helps with the dizziness while driving, so he wanted to try that first to see if that helps.  He did see Tabitha Dugal yesterday Nasal Congestion/HTN & Allergic Sinusitis.  He was advised to follow up in 4 weeks.  I did go ahead and schedule him an office visit with Dr. Diona Browner on 07/28/2021 at 8:20 am.   FYI to Dr. Diona Browner.

## 2021-06-24 NOTE — Assessment & Plan Note (Signed)
At this time I will add Singulair to the plan of care as well as recommend the patient continue to take Flonase once daily consistently.  Continue with azelastine as well.  If no noted improvement advised patient to follow back up with ENT.

## 2021-06-24 NOTE — Assessment & Plan Note (Signed)
Due to elevated blood pressure of visit as well at home I have advised patient to increase losartan to 50 mg and to take once daily.  Advised patient to monitor blood pressure at home daily with feet flat on the floor prior to taking and resting for at least 5 to 10 minutes prior.  Have patient record blood pressure and return to follow-up visit with log in hand to review.  Watch a low-sodium diet as well

## 2021-06-25 ENCOUNTER — Telehealth: Payer: Self-pay | Admitting: Family Medicine

## 2021-06-25 NOTE — Telephone Encounter (Signed)
As we discussed in the office we increased losartan to 50 mg once daily.  The after visit summary also mentioned this in detail, however I want pt to check blod pressure once daily over the next few weeks and follow up within the month with blood pressure in hand.   How have his blood pressures looked in the past few days?

## 2021-06-25 NOTE — Telephone Encounter (Signed)
Pt called wanting to discuss his losartan (COZAAR) 50 MG tablet, if he needed to continue to monitor his BP and the dosage

## 2021-06-26 NOTE — Telephone Encounter (Signed)
Looks as though pt has appt with Dr. Diona Browner in 07/28/21.

## 2021-06-26 NOTE — Telephone Encounter (Signed)
Please read bottom on message where I called patient.

## 2021-06-26 NOTE — Telephone Encounter (Signed)
Please call pt to see how mood is going. See if he did view message, start med etc.

## 2021-06-26 NOTE — Telephone Encounter (Signed)
Patient called and informed of Tabitha's recommendations, patient replied with stating his anxiety raises every time he tries to take his bp. Patient stated that he stopped checking it but stated he feels really good.

## 2021-06-30 ENCOUNTER — Encounter: Payer: Self-pay | Admitting: Family

## 2021-07-05 ENCOUNTER — Other Ambulatory Visit: Payer: Self-pay | Admitting: Family Medicine

## 2021-07-09 DIAGNOSIS — R69 Illness, unspecified: Secondary | ICD-10-CM | POA: Diagnosis not present

## 2021-07-15 ENCOUNTER — Other Ambulatory Visit: Payer: Self-pay | Admitting: Family

## 2021-07-15 DIAGNOSIS — I10 Essential (primary) hypertension: Secondary | ICD-10-CM

## 2021-07-21 DIAGNOSIS — R69 Illness, unspecified: Secondary | ICD-10-CM | POA: Diagnosis not present

## 2021-07-28 ENCOUNTER — Ambulatory Visit (INDEPENDENT_AMBULATORY_CARE_PROVIDER_SITE_OTHER): Payer: 59 | Admitting: Family Medicine

## 2021-07-28 ENCOUNTER — Encounter: Payer: Self-pay | Admitting: Family Medicine

## 2021-07-28 ENCOUNTER — Other Ambulatory Visit: Payer: Self-pay

## 2021-07-28 DIAGNOSIS — I10 Essential (primary) hypertension: Secondary | ICD-10-CM

## 2021-07-28 DIAGNOSIS — R69 Illness, unspecified: Secondary | ICD-10-CM | POA: Diagnosis not present

## 2021-07-28 DIAGNOSIS — F418 Other specified anxiety disorders: Secondary | ICD-10-CM

## 2021-07-28 DIAGNOSIS — R42 Dizziness and giddiness: Secondary | ICD-10-CM | POA: Diagnosis not present

## 2021-07-28 NOTE — Assessment & Plan Note (Signed)
Inadequate control likely due to poorly controlled anxiety.   Element of white coat HTN as well as anxiety around checking his blood pressure even at home.  Follow up in 4 weeks for re-eval.   Losartan 50 mg daily

## 2021-07-28 NOTE — Assessment & Plan Note (Signed)
Situational,  Has stopped vestibular rehab as it was not helpful. Symptoms did not resolve off of medications.  Most likely due to anxiety given associated with driving on highway.

## 2021-07-28 NOTE — Assessment & Plan Note (Signed)
Chronic, inadequate control   Given lexapro low dose 5 mg daily longer to start working.  He is sensitive to meds ( many cause GERD) so is appropriately hesitant to elevated dose.

## 2021-07-28 NOTE — Progress Notes (Signed)
Patient ID: Blake Molina, male    DOB: 03/11/1966, 56 y.o.   MRN: [id number]  This visit was conducted in person.  BP (!) 142/98 (BP Location: Right Arm, Cuff Size: Normal)    Pulse 88    Temp 98 F (36.7 C) (Temporal)    Ht 5' 8.6" (1.742 m)    Wt 170 lb 7 oz (77.3 kg)    SpO2 98%    BMI 25.46 kg/m    CC:  Chief Complaint  Patient presents with   Hypertension    Here for f/u.    Subjective:   HPI: Blake Molina is a 56 y.o. male presenting on 07/28/2021 for Hypertension (Here for f/u.) Reviewed last 2 OV notes from my partners.. 03/2021, 06/2021  Hypertension:   He had noted elevated blood pressures in last month despite losartan 1/2 tab of 50 mg losartan. Saw Eugenia Pancoast NP for sinus infection.. BP was elevated 140/90.Marland Kitchen recommended  increased losartan t 50 mg daily, following BP and low salt diet.   Since then he reports he is feeling better except is very anxious today. Using medication without problems or lightheadedness:  Chest pain with exertion: none Edema:none Short of breath: none Average home BPs: not checking.. felt better no headache Other issues:  He states he is BP Readings from Last 3 Encounters:  07/28/21 (!) 142/98  06/23/21 140/90  03/31/21 138/80   Situational anxiety:  he has been increased anxiety in last several  months.. started lexapro  5 mg  3 weeks ago.Has improved symptoms   some.  Some mild stomach upset but not much.  Had SE to buspar... reflux SE. He is seeing a psychologist. [facility]  Dizziness noted only when driving on highway: Has been seeing ENT and doing vestibular rehab.  No issue recently.  GERD controlled with dexilant.   Normal MRI  Relevant past medical, surgical, family and social history reviewed and updated as indicated. Interim medical history since our last visit reviewed. Allergies and medications reviewed and updated. Outpatient Medications Prior to Visit  Medication Sig Dispense Refill   albuterol  (VENTOLIN HFA) 108 (90 Base) MCG/ACT inhaler Inhale 1-2 puffs into the lungs every 6 (six) hours as needed for wheezing or shortness of breath. 3 each 1   Azelastine HCl 137 MCG/SPRAY SOLN Place 1-2 sprays into the nose as needed.     dexlansoprazole (DEXILANT) 60 MG capsule Take 1 capsule (60 mg total) by mouth daily. 90 capsule 3   escitalopram (LEXAPRO) 5 MG tablet Take 1 tablet (5 mg total) by mouth daily. 30 tablet 0   fluticasone (FLONASE) 50 MCG/ACT nasal spray Place 2 sprays into both nostrils daily. 16 g 6   guaiFENesin (MUCINEX) 600 MG 12 hr tablet Take 600 mg by mouth 2 (two) times daily as needed.     losartan (COZAAR) 50 MG tablet TAKE 1 TABLET BY MOUTH EVERY DAY 30 tablet 1   scopolamine (TRANSDERM-SCOP) 1 MG/3DAYS Place 1 patch onto the skin every three (3) days as needed.     fexofenadine (ALLEGRA) 180 MG tablet Take 180 mg by mouth daily.     montelukast (SINGULAIR) 10 MG tablet Take 1 tablet (10 mg total) by mouth at bedtime. 90 tablet 1   No facility-administered medications prior to visit.     Per HPI unless specifically indicated in ROS section below Review of Systems  Constitutional:  Negative for fatigue and fever.  HENT:  Negative for ear pain.  Eyes:  Negative for pain.  Respiratory:  Negative for cough and shortness of breath.   Cardiovascular:  Negative for chest pain, palpitations and leg swelling.  Gastrointestinal:  Negative for abdominal pain.  Genitourinary:  Negative for dysuria.  Musculoskeletal:  Negative for arthralgias.  Neurological:  Negative for syncope, light-headedness and headaches.  Psychiatric/Behavioral:  Negative for dysphoric mood, sleep disturbance and suicidal ideas. The patient is nervous/anxious.   Objective:  BP (!) 142/98 (BP Location: Right Arm, Cuff Size: Normal)    Pulse 88    Temp 98 F (36.7 C) (Temporal)    Ht 5' 8.6" (1.742 m)    Wt 170 lb 7 oz (77.3 kg)    SpO2 98%    BMI 25.46 kg/m   Wt Readings from Last 3 Encounters:   07/28/21 170 lb 7 oz (77.3 kg)  06/23/21 174 lb 4 oz (79 kg)  03/31/21 168 lb 4 oz (76.3 kg)      Physical Exam Constitutional:      Appearance: He is well-developed.  HENT:     Head: Normocephalic.     Right Ear: Hearing normal.     Left Ear: Hearing normal.     Nose: Nose normal.  Neck:     Thyroid: No thyroid mass or thyromegaly.     Vascular: No carotid bruit.     Trachea: Trachea normal.  Cardiovascular:     Rate and Rhythm: Normal rate and regular rhythm.     Pulses: Normal pulses.     Heart sounds: Heart sounds not distant. No murmur heard.   No friction rub. No gallop.     Comments: No peripheral edema Pulmonary:     Effort: Pulmonary effort is normal. No respiratory distress.     Breath sounds: Normal breath sounds.  Skin:    General: Skin is warm and dry.     Findings: No rash.  Psychiatric:        Speech: Speech normal.        Behavior: Behavior normal.        Thought Content: Thought content normal.      Results for orders placed or performed in visit on 06/23/21  POC COVID-19  Result Value Ref Range   SARS Coronavirus 2 Ag Negative Negative    This visit occurred during the SARS-CoV-2 public health emergency.  Safety protocols were in place, including screening questions prior to the visit, additional usage of staff PPE, and extensive cleaning of exam room while observing appropriate contact time as indicated for disinfecting solutions.   COVID 19 screen:  No recent travel or known exposure to COVID19 The patient denies respiratory symptoms of COVID 19 at this time. The importance of social distancing was discussed today.   Assessment and Plan Problem List Items Addressed This Visit     Dizziness    Situational,  Has stopped vestibular rehab as it was not helpful. Symptoms did not resolve off of medications.  Most likely due to anxiety given associated with driving on highway.      Hypertension, essential, benign    Inadequate control likely  due to poorly controlled anxiety.   Element of white coat HTN as well as anxiety around checking his blood pressure even at home.  Follow up in 4 weeks for re-eval.   Losartan 50 mg daily      Situational anxiety     Chronic, inadequate control   Given lexapro low dose 5 mg daily longer to start working.  He  is sensitive to meds ( many cause GERD) so is appropriately hesitant to elevated dose.          [name], MD

## 2021-07-28 NOTE — Patient Instructions (Addendum)
Follow blood pressure at home every  every other day when relaxed/ not anxious.Marland Kitchen goal BP < 140/90 Continue full tablet of losartan 50 mg daily. Continue low lexapro 5 mg daily.  Work on stress reduction, regular exercise.

## 2021-08-02 ENCOUNTER — Other Ambulatory Visit: Payer: Self-pay | Admitting: Family Medicine

## 2021-08-03 ENCOUNTER — Other Ambulatory Visit: Payer: Self-pay | Admitting: Family

## 2021-08-03 DIAGNOSIS — I10 Essential (primary) hypertension: Secondary | ICD-10-CM

## 2021-08-03 NOTE — Telephone Encounter (Signed)
Lexapro Last filled:  06/09/21, #30 Last OV:  07/28/21, HTN f/u; start Lexapro Next OV:  08/27/21, 4 wk HTN f/u

## 2021-08-06 DIAGNOSIS — R69 Illness, unspecified: Secondary | ICD-10-CM | POA: Diagnosis not present

## 2021-08-18 DIAGNOSIS — R69 Illness, unspecified: Secondary | ICD-10-CM | POA: Diagnosis not present

## 2021-08-26 ENCOUNTER — Other Ambulatory Visit: Payer: Self-pay | Admitting: Family Medicine

## 2021-08-26 NOTE — Telephone Encounter (Signed)
Dexilent not covered by patient's insurance.  Asking for alternative. ?

## 2021-08-27 ENCOUNTER — Encounter: Payer: Self-pay | Admitting: Family Medicine

## 2021-08-27 ENCOUNTER — Ambulatory Visit (INDEPENDENT_AMBULATORY_CARE_PROVIDER_SITE_OTHER): Payer: 59 | Admitting: Family Medicine

## 2021-08-27 ENCOUNTER — Other Ambulatory Visit: Payer: Self-pay

## 2021-08-27 VITALS — BP 136/88 | HR 74 | Ht 68.0 in | Wt 162.6 lb

## 2021-08-27 DIAGNOSIS — F418 Other specified anxiety disorders: Secondary | ICD-10-CM

## 2021-08-27 DIAGNOSIS — I10 Essential (primary) hypertension: Secondary | ICD-10-CM

## 2021-08-27 DIAGNOSIS — K219 Gastro-esophageal reflux disease without esophagitis: Secondary | ICD-10-CM

## 2021-08-27 DIAGNOSIS — R69 Illness, unspecified: Secondary | ICD-10-CM | POA: Diagnosis not present

## 2021-08-27 MED ORDER — ESCITALOPRAM OXALATE 10 MG PO TABS: 10.0000 mg | ORAL_TABLET | Freq: Every day | ORAL | 1 refills | Status: DC

## 2021-08-27 MED ORDER — LANSOPRAZOLE 30 MG PO CPDR: 30.0000 mg | DELAYED_RELEASE_CAPSULE | Freq: Every day | ORAL | 3 refills | Status: DC

## 2021-08-27 NOTE — Assessment & Plan Note (Signed)
Moderate control, but well controlled if sticking to low acid diet. ? ?Insurance does not cover ?Any longer.  We will change to Prevacid 30 mg daily which per patient has worked well in the past. ?

## 2021-08-27 NOTE — Progress Notes (Signed)
? ? Patient ID: Blake Molina, male    DOB: 10-08-1965, 56 y.o.   MRN: [id number] ? ?This visit was conducted in person. ?Vitals:  ? 08/27/21 0830  ?BP: 136/88  ?Pulse: 74  ?SpO2: 99%  ? ? ? ?CC: ?Chief Complaint  ?Patient presents with  ? Follow-up  ?  Follow up blood pressure and anxiety  , anxiety has not improved  , medication hasn't help much.   ? ? ?Subjective:  ? ?HPI: ?Blake Molina is a 56 y.o. male presenting on 08/27/2021 for Follow-up (Follow up blood pressure and anxiety  , anxiety has not improved  , medication hasn't help much. ) ? ? ? ?At last OV 4 weeks ago... ? ?Hypertension:    Well controlled on losartan 50 mg daily. ?BP Readings from Last 3 Encounters:  ?08/27/21 136/88  ?07/28/21 (!) 142/98  ?06/23/21 140/90  ?Using medication without problems or lightheadedness:  none ?Chest pain with exertion: none ?Edema:none ?Short of breath:none ?Average home BPs: ?Other issues: ? ? Situational anxiety: Minimal improvement on low dose lexapro 5 mg daily  ? He has been on it for 8 weeks. He has had more stress at work. ? He notes  in early AM.. feels  tired, nervous... most days.... start feeling better later in the day. ? He is seeing Opal Sidles a Social worker every other week. ?  ?GAD 7 : Generalized Anxiety Score 08/27/2021 10/09/2020 09/16/2020 11/23/2018  ?Nervous, Anxious, on Edge '3 1 3 1  '$ ?Control/stop worrying '3 1 3 1  '$ ?Worry too much - different things '3 1 3 1  '$ ?Trouble relaxing 0 0 0 1  ?Restless 0 0 0 0  ?Easily annoyed or irritable 0 0 0 1  ?Afraid - awful might happen 0 0 0 0  ?Total GAD 7 Score '9 3 9 5  '$ ?Anxiety Difficulty - Not difficult at all Very difficult Somewhat difficult  ? ? ? GERD previously controlled with Dexilant but this is now not covered by insurance. ? Has had some increase of reflux and stomach upset with lexapro as SE ( had similar issue with Buspar) ? ? ? ?   ? ?Relevant past medical, surgical, family and social history reviewed and updated as indicated. Interim medical history since  our last visit reviewed. ?Allergies and medications reviewed and updated. ?Outpatient Medications Prior to Visit  ?Medication Sig Dispense Refill  ? albuterol (VENTOLIN HFA) 108 (90 Base) MCG/ACT inhaler Inhale 1-2 puffs into the lungs every 6 (six) hours as needed for wheezing or shortness of breath. 3 each 1  ? Azelastine HCl 137 MCG/SPRAY SOLN Place 1-2 sprays into the nose as needed.    ? dexlansoprazole (DEXILANT) 60 MG capsule TAKE 1 CAPSULE BY MOUTH EVERY DAY 90 capsule 1  ? escitalopram (LEXAPRO) 5 MG tablet TAKE 1 TABLET (5 MG TOTAL) BY MOUTH DAILY. 90 tablet 1  ? fluticasone (FLONASE) 50 MCG/ACT nasal spray Place 2 sprays into both nostrils daily. 16 g 6  ? guaiFENesin (MUCINEX) 600 MG 12 hr tablet Take 600 mg by mouth 2 (two) times daily as needed.    ? losartan (COZAAR) 50 MG tablet TAKE 1 TABLET BY MOUTH EVERY DAY 30 tablet 1  ? scopolamine (TRANSDERM-SCOP) 1 MG/3DAYS Place 1 patch onto the skin every three (3) days as needed.    ? ?No facility-administered medications prior to visit.  ?  ? ?Per HPI unless specifically indicated in ROS section below ?Review of Systems  ?Constitutional:  Negative for fatigue  and fever.  ?HENT:  Negative for ear pain.   ?Eyes:  Negative for pain.  ?Respiratory:  Negative for cough and shortness of breath.   ?Cardiovascular:  Negative for chest pain, palpitations and leg swelling.  ?Gastrointestinal:  Negative for abdominal pain.  ?Genitourinary:  Negative for dysuria.  ?Musculoskeletal:  Negative for arthralgias.  ?Neurological:  Negative for syncope, light-headedness and headaches.  ?Psychiatric/Behavioral:  Negative for dysphoric mood.   ?Objective:  ?There were no vitals taken for this visit.  ?Wt Readings from Last 3 Encounters:  ?07/28/21 170 lb 7 oz (77.3 kg)  ?06/23/21 174 lb 4 oz (79 kg)  ?03/31/21 168 lb 4 oz (76.3 kg)  ?  ?  ?Physical Exam ?Constitutional:   ?   Appearance: He is well-developed.  ?HENT:  ?   Head: Normocephalic.  ?   Right Ear: Hearing normal.   ?   Left Ear: Hearing normal.  ?   Nose: Nose normal.  ?Neck:  ?   Thyroid: No thyroid mass or thyromegaly.  ?   Vascular: No carotid bruit.  ?   Trachea: Trachea normal.  ?Cardiovascular:  ?   Rate and Rhythm: Normal rate and regular rhythm.  ?   Pulses: Normal pulses.  ?   Heart sounds: Heart sounds not distant. No murmur heard. ?  No friction rub. No gallop.  ?   Comments: No peripheral edema ?Pulmonary:  ?   Effort: Pulmonary effort is normal. No respiratory distress.  ?   Breath sounds: Normal breath sounds.  ?Skin: ?   General: Skin is warm and dry.  ?   Findings: No rash.  ?Psychiatric:     ?   Speech: Speech normal.     ?   Behavior: Behavior normal.     ?   Thought Content: Thought content normal.  ? ?   ?Results for orders placed or performed in visit on 06/23/21  ?[place] COVID-19  ?Result Value Ref Range  ? SARS Coronavirus 2 Ag Negative Negative  ? ? ?This visit occurred during the SARS-CoV-2 public health emergency.  Safety protocols were in place, including screening questions prior to the visit, additional usage of staff PPE, and extensive cleaning of exam room while observing appropriate contact time as indicated for disinfecting solutions.  ? ?COVID 19 screen:  No recent travel or known exposure to [place] ?The patient denies respiratory symptoms of COVID 19 at this time. ?The importance of social distancing was discussed today.  ? ?Assessment and Plan ? ?  ?Problem List Items Addressed This Visit   ? ? Gastroesophageal reflux disease  ?  Moderate control, but well controlled if sticking to low acid diet. ? ?Insurance does not cover ?Any longer.  We will change to Prevacid 30 mg daily which per patient has worked well in the past. ?  ?  ? Relevant Medications  ? lansoprazole (PREVACID) 30 MG capsule  ? Hypertension, essential, benign - Primary  ?  Stable, chronic.  Continue current medication. ? ? ?Losartan 50 mg daily ?  ?  ? Situational anxiety  ?  Moderate control, chronic: ? ?We will increase  Lexapro to 10 mg daily.  He is taking with food to avoid possible upset stomach side effect.  Counseling every other week. ?  ?  ? Relevant Medications  ? escitalopram (LEXAPRO) 10 MG tablet  ? ?Meds ordered this encounter  ?Medications  ? lansoprazole (PREVACID) 30 MG capsule  ?  Sig: Take 1 capsule (30 mg  total) by mouth daily at 12 noon.  ?  Dispense:  90 capsule  ?  Refill:  3  ? escitalopram (LEXAPRO) 10 MG tablet  ?  Sig: Take 1 tablet (10 mg total) by mouth daily.  ?  Dispense:  90 tablet  ?  Refill:  1  ? ? ? ?[name], MD  ? ?

## 2021-08-27 NOTE — Assessment & Plan Note (Signed)
Moderate control, chronic: ? ?We will increase Lexapro to 10 mg daily.  He is taking with food to avoid possible upset stomach side effect.  Counseling every other week. ?

## 2021-08-27 NOTE — Assessment & Plan Note (Signed)
Stable, chronic.  Continue current medication.  Losartan 50 mg daily 

## 2021-08-27 NOTE — Telephone Encounter (Signed)
Call ? ? Does this patient have a preference of which PPI to send in... see the list of choices on rx request change. ?

## 2021-08-27 NOTE — Patient Instructions (Addendum)
Change Dexilant to prevacid for insurance coverage reasons. ? Follow blood pressure daily ? Increase Lexapro to 10 mg daily. ? Start exercise  3-5 days a week. ? ?

## 2021-09-01 DIAGNOSIS — R69 Illness, unspecified: Secondary | ICD-10-CM | POA: Diagnosis not present

## 2021-09-15 DIAGNOSIS — R69 Illness, unspecified: Secondary | ICD-10-CM | POA: Diagnosis not present

## 2021-09-29 DIAGNOSIS — R69 Illness, unspecified: Secondary | ICD-10-CM | POA: Diagnosis not present

## 2021-10-01 ENCOUNTER — Telehealth: Payer: Self-pay | Admitting: Family Medicine

## 2021-10-01 MED ORDER — ESCITALOPRAM OXALATE 10 MG PO TABS: 15.0000 mg | ORAL_TABLET | Freq: Every day | ORAL | 1 refills | Status: DC

## 2021-10-01 NOTE — Telephone Encounter (Signed)
It is okay to try a trial of increased lexapro at 15 mg daily.. please change med list or refill at 1.5 mg tabs daily if needed. ?

## 2021-10-01 NOTE — Telephone Encounter (Signed)
See below

## 2021-10-01 NOTE — Telephone Encounter (Signed)
Spoke with Blake Molina.  He states he is currently taking Lexapro 10 mg daily which he feels he is doing better on but doesn't feel like he is where he would like to be.  He is also seeing therapist which Dr. Diona Browner recommended.  His therapist is suggesting that he increase his Lexapro to 15 mg daily and he wanted to make sure that was okay with Dr. Diona Browner.  Patient had to cancel his follow up with Dr. Diona Browner that was scheduled tomorrow due to his wife is sick.  He is rescheduled for May.  Please advise.  ?

## 2021-10-01 NOTE — Telephone Encounter (Signed)
Adeel notified by telephone, okay to increase lexapro to 15 mg per Dr. Diona Browner.  Medication list updated.  Refill with new dosing instructions sent to [facility] in [place] for next refill.  ?

## 2021-10-01 NOTE — Telephone Encounter (Signed)
Pt stated his therapist recommended that he can be bumped up 5 mg on the Escitalopram (Lexapro 10 MG). He wants to speak with the nurse about this.  ?

## 2021-10-02 ENCOUNTER — Ambulatory Visit: Payer: 59 | Admitting: Family Medicine

## 2021-10-13 DIAGNOSIS — R69 Illness, unspecified: Secondary | ICD-10-CM | POA: Diagnosis not present

## 2021-10-20 ENCOUNTER — Other Ambulatory Visit: Payer: Self-pay | Admitting: Family

## 2021-10-20 DIAGNOSIS — I10 Essential (primary) hypertension: Secondary | ICD-10-CM

## 2021-10-27 ENCOUNTER — Ambulatory Visit: Payer: 59 | Admitting: Family Medicine

## 2021-11-03 ENCOUNTER — Encounter: Payer: Self-pay | Admitting: Family Medicine

## 2021-11-03 ENCOUNTER — Ambulatory Visit (INDEPENDENT_AMBULATORY_CARE_PROVIDER_SITE_OTHER): Payer: 59 | Admitting: Family Medicine

## 2021-11-03 VITALS — BP 130/80 | HR 80 | Temp 98.6°F | Ht 68.0 in | Wt 161.1 lb

## 2021-11-03 DIAGNOSIS — K219 Gastro-esophageal reflux disease without esophagitis: Secondary | ICD-10-CM

## 2021-11-03 DIAGNOSIS — I10 Essential (primary) hypertension: Secondary | ICD-10-CM

## 2021-11-03 DIAGNOSIS — F418 Other specified anxiety disorders: Secondary | ICD-10-CM

## 2021-11-03 DIAGNOSIS — R69 Illness, unspecified: Secondary | ICD-10-CM | POA: Diagnosis not present

## 2021-11-03 NOTE — Assessment & Plan Note (Signed)
Chronic, improved control on Lexapro 10 mg daily.. he will try 15 mg dose to see if further improvement without SE. Rx already sen in.

## 2021-11-03 NOTE — Assessment & Plan Note (Signed)
Stable, chronic.  Continue current medication.  Losartan 50 mg daily 

## 2021-11-03 NOTE — Progress Notes (Signed)
Patient ID: Blake Molina, male    DOB: 08-Jun-1966, 56 y.o.   MRN: [id number]  This visit was conducted in person.  BP 130/80   Pulse 80   Temp 98.6 F (37 C) (Oral)   Ht '5\' 8"'$  (1.727 m)   Wt 161 lb 1 oz (73.1 kg)   SpO2 98%   BMI 24.49 kg/m    CC: Chief Complaint  Patient presents with   Follow-up    Anxiety and HTN-Hasn't started the 15 mg of Lexapro-Will start tonight    Subjective:   HPI: Blake Molina is a 56 y.o. male presenting on 11/03/2021 for Follow-up (Anxiety and HTN-Hasn't started the 15 mg of Lexapro-Will start tonight)  Hypertension:   At goal on losartan  50 mg daily  BP Readings from Last 3 Encounters:  11/03/21 130/80  08/27/21 136/88  07/28/21 (!) 142/98  Using medication without problems or lightheadedness:  none Chest pain with exertion: none Edema: none Short of breath: none Average home BPs: not checking Other issues:  Generalized anxiety disorder:    On lexapro 10 mg daily.. he feels there is still room for improvement, but has noted significant improvement already. Has not yet increased Lexapro to 15 mg daily.. he still plans top.  Going to counseling every other week.  GAD 7: 3 PHQ2 2   Good control of GERD with  lansoprazole 40 mg daily.  Has decreased triggers,  "junk"    Wt Readings from Last 3 Encounters:  11/03/21 161 lb 1 oz (73.1 kg)  08/27/21 162 lb 9.6 oz (73.8 kg)  07/28/21 170 lb 7 oz (77.3 kg)  Body mass index is 24.49 kg/m.    Relevant past medical, surgical, family and social history reviewed and updated as indicated. Interim medical history since our last visit reviewed. Allergies and medications reviewed and updated. Outpatient Medications Prior to Visit  Medication Sig Dispense Refill   albuterol (VENTOLIN HFA) 108 (90 Base) MCG/ACT inhaler Inhale 1-2 puffs into the lungs every 6 (six) hours as needed for wheezing or shortness of breath. 3 each 1   Azelastine HCl 137 MCG/SPRAY SOLN Place 1-2 sprays into the  nose as needed.     escitalopram (LEXAPRO) 10 MG tablet Take 1.5 tablets (15 mg total) by mouth daily. 135 tablet 1   fluticasone (FLONASE) 50 MCG/ACT nasal spray Place 2 sprays into both nostrils daily. 16 g 6   guaiFENesin (MUCINEX) 600 MG 12 hr tablet Take 600 mg by mouth 2 (two) times daily as needed.     lansoprazole (PREVACID) 30 MG capsule Take 1 capsule (30 mg total) by mouth daily at 12 noon. 90 capsule 3   losartan (COZAAR) 50 MG tablet TAKE 1 TABLET BY MOUTH EVERY DAY 30 tablet 1   scopolamine (TRANSDERM-SCOP) 1 MG/3DAYS Place 1 patch onto the skin every three (3) days as needed.     No facility-administered medications prior to visit.     Per HPI unless specifically indicated in ROS section below Review of Systems  Constitutional:  Negative for fatigue and fever.  HENT:  Negative for ear pain.   Eyes:  Negative for pain.  Respiratory:  Negative for cough and shortness of breath.   Cardiovascular:  Negative for chest pain, palpitations and leg swelling.  Gastrointestinal:  Negative for abdominal pain.  Genitourinary:  Negative for dysuria.  Musculoskeletal:  Negative for arthralgias.  Neurological:  Negative for syncope, light-headedness and headaches.  Psychiatric/Behavioral:  Negative for dysphoric mood.  Objective:  BP 130/80   Pulse 80   Temp 98.6 F (37 C) (Oral)   Ht '5\' 8"'$  (1.727 m)   Wt 161 lb 1 oz (73.1 kg)   SpO2 98%   BMI 24.49 kg/m   Wt Readings from Last 3 Encounters:  11/03/21 161 lb 1 oz (73.1 kg)  08/27/21 162 lb 9.6 oz (73.8 kg)  07/28/21 170 lb 7 oz (77.3 kg)      Physical Exam Constitutional:      General: He is not in acute distress.    Appearance: Normal appearance. He is well-developed. He is not ill-appearing or toxic-appearing.  HENT:     Head: Normocephalic and atraumatic.     Right Ear: Hearing, tympanic membrane, ear canal and external ear normal.     Left Ear: Hearing, tympanic membrane, ear canal and external ear normal.     Nose:  Nose normal.     Mouth/Throat:     Pharynx: Uvula midline.  Eyes:     General: Lids are normal. Lids are everted, no foreign bodies appreciated.     Conjunctiva/sclera: Conjunctivae normal.     Pupils: Pupils are equal, round, and reactive to light.  Neck:     Thyroid: No thyroid mass or thyromegaly.     Vascular: No carotid bruit.     Trachea: Trachea and phonation normal.  Cardiovascular:     Rate and Rhythm: Normal rate and regular rhythm.     Pulses: Normal pulses.     Heart sounds: S1 normal and S2 normal. No murmur heard.   No gallop.  Pulmonary:     Breath sounds: Normal breath sounds. No wheezing, rhonchi or rales.  Abdominal:     General: Bowel sounds are normal.     Palpations: Abdomen is soft.     Tenderness: There is no abdominal tenderness. There is no guarding or rebound.     Hernia: No hernia is present.  Musculoskeletal:     Cervical back: Normal range of motion and neck supple.  Lymphadenopathy:     Cervical: No cervical adenopathy.  Skin:    General: Skin is warm and dry.     Findings: No rash.  Neurological:     Mental Status: He is alert.     Cranial Nerves: No cranial nerve deficit.     Sensory: No sensory deficit.     Gait: Gait normal.     Deep Tendon Reflexes: Reflexes are normal and symmetric.  Psychiatric:        Speech: Speech normal.        Behavior: Behavior normal.        Judgment: Judgment normal.      Results for orders placed or performed in visit on 06/23/21  POC COVID-19  Result Value Ref Range   SARS Coronavirus 2 Ag Negative Negative     COVID 19 screen:  No recent travel or known exposure to COVID19 The patient denies respiratory symptoms of COVID 19 at this time. The importance of social distancing was discussed today.   Assessment and Plan Problem List Items Addressed This Visit     Gastroesophageal reflux disease    Stable, chronic.  Continue current medication.   Lansoprazole 40 mg daily and trigger avoidance       Hypertension, essential, benign - Primary    Stable, chronic.  Continue current medication.   Losartan 50 mg daily      Situational anxiety    Chronic, improved control on Lexapro 10  mg daily.. he will try 15 mg dose to see if further improvement without SE. Rx already sen in.           [name], MD

## 2021-11-03 NOTE — Patient Instructions (Addendum)
Continue current medications  Increase water intake.

## 2021-11-03 NOTE — Assessment & Plan Note (Signed)
Stable, chronic.  Continue current medication.   Lansoprazole 40 mg daily and trigger avoidance

## 2021-11-17 ENCOUNTER — Encounter: Payer: Self-pay | Admitting: Family Medicine

## 2021-11-17 DIAGNOSIS — R69 Illness, unspecified: Secondary | ICD-10-CM | POA: Diagnosis not present

## 2021-11-19 ENCOUNTER — Telehealth: Payer: 59 | Admitting: Family Medicine

## 2021-12-14 DIAGNOSIS — R69 Illness, unspecified: Secondary | ICD-10-CM | POA: Diagnosis not present

## 2021-12-29 DIAGNOSIS — R69 Illness, unspecified: Secondary | ICD-10-CM | POA: Diagnosis not present

## 2022-01-14 DIAGNOSIS — R69 Illness, unspecified: Secondary | ICD-10-CM | POA: Diagnosis not present

## 2022-01-28 ENCOUNTER — Other Ambulatory Visit: Payer: Self-pay | Admitting: Family Medicine

## 2022-01-28 DIAGNOSIS — I10 Essential (primary) hypertension: Secondary | ICD-10-CM

## 2022-02-01 DIAGNOSIS — R69 Illness, unspecified: Secondary | ICD-10-CM | POA: Diagnosis not present

## 2022-02-11 ENCOUNTER — Encounter: Payer: 59 | Admitting: Family Medicine

## 2022-02-28 ENCOUNTER — Other Ambulatory Visit: Payer: Self-pay | Admitting: Family Medicine

## 2022-02-28 DIAGNOSIS — I10 Essential (primary) hypertension: Secondary | ICD-10-CM

## 2022-03-02 DIAGNOSIS — R69 Illness, unspecified: Secondary | ICD-10-CM | POA: Diagnosis not present

## 2022-03-23 DIAGNOSIS — R69 Illness, unspecified: Secondary | ICD-10-CM | POA: Diagnosis not present

## 2022-03-23 DIAGNOSIS — F4322 Adjustment disorder with anxiety: Secondary | ICD-10-CM | POA: Diagnosis not present

## 2022-03-25 DIAGNOSIS — J385 Laryngeal spasm: Secondary | ICD-10-CM | POA: Diagnosis not present

## 2022-03-25 DIAGNOSIS — K219 Gastro-esophageal reflux disease without esophagitis: Secondary | ICD-10-CM | POA: Diagnosis not present

## 2022-03-27 ENCOUNTER — Other Ambulatory Visit: Payer: Self-pay | Admitting: Family Medicine

## 2022-03-27 DIAGNOSIS — I10 Essential (primary) hypertension: Secondary | ICD-10-CM

## 2022-04-06 DIAGNOSIS — F4322 Adjustment disorder with anxiety: Secondary | ICD-10-CM | POA: Diagnosis not present

## 2022-04-06 DIAGNOSIS — R69 Illness, unspecified: Secondary | ICD-10-CM | POA: Diagnosis not present

## 2022-04-22 DIAGNOSIS — R69 Illness, unspecified: Secondary | ICD-10-CM | POA: Diagnosis not present

## 2022-04-23 ENCOUNTER — Encounter: Payer: 59 | Admitting: Family Medicine

## 2022-04-29 ENCOUNTER — Ambulatory Visit (INDEPENDENT_AMBULATORY_CARE_PROVIDER_SITE_OTHER): Payer: 59 | Admitting: Family Medicine

## 2022-04-29 ENCOUNTER — Encounter: Payer: Self-pay | Admitting: Family Medicine

## 2022-04-29 VITALS — BP 128/72 | HR 72 | Temp 98.1°F | Ht 70.0 in | Wt 172.0 lb

## 2022-04-29 DIAGNOSIS — Z0000 Encounter for general adult medical examination without abnormal findings: Secondary | ICD-10-CM | POA: Diagnosis not present

## 2022-04-29 DIAGNOSIS — I10 Essential (primary) hypertension: Secondary | ICD-10-CM

## 2022-04-29 DIAGNOSIS — Z125 Encounter for screening for malignant neoplasm of prostate: Secondary | ICD-10-CM | POA: Diagnosis not present

## 2022-04-29 DIAGNOSIS — J4540 Moderate persistent asthma, uncomplicated: Secondary | ICD-10-CM | POA: Diagnosis not present

## 2022-04-29 DIAGNOSIS — G4733 Obstructive sleep apnea (adult) (pediatric): Secondary | ICD-10-CM

## 2022-04-29 LAB — COMPREHENSIVE METABOLIC PANEL
ALT: 21 U/L (ref 0–53)
AST: 23 U/L (ref 0–37)
Albumin: 4.5 g/dL (ref 3.5–5.2)
Alkaline Phosphatase: 51 U/L (ref 39–117)
BUN: 18 mg/dL (ref 6–23)
CO2: 30 mEq/L (ref 19–32)
Calcium: 9.6 mg/dL (ref 8.4–10.5)
Chloride: 103 mEq/L (ref 96–112)
Creatinine, Ser: 0.97 mg/dL (ref 0.40–1.50)
GFR: 87.61 mL/min (ref >60.00)
Glucose, Bld: 87 mg/dL (ref 70–99)
Potassium: 4.6 mEq/L (ref 3.5–5.1)
Sodium: 139 mEq/L (ref 135–145)
Total Bilirubin: 0.5 mg/dL (ref 0.2–1.2)
Total Protein: 7 g/dL (ref 6.0–8.3)

## 2022-04-29 LAB — LIPID PANEL
Cholesterol: 164 mg/dL (ref 0–200)
HDL: 47 mg/dL (ref >39.00)
LDL Cholesterol: 105 mg/dL — ABNORMAL HIGH (ref 0–99)
NonHDL: 116.57
Total CHOL/HDL Ratio: 3
Triglycerides: 60 mg/dL (ref 0.0–149.0)
VLDL: 12 mg/dL (ref 0.0–40.0)

## 2022-04-29 LAB — PSA: PSA: 0.68 ng/mL (ref 0.10–4.00)

## 2022-04-29 NOTE — Patient Instructions (Addendum)
Please stop at the lab to have labs drawn.  Call [facility] for next step with sleep apnea  [phone].  Consider flu and Shingrix vaccine series.

## 2022-04-29 NOTE — Assessment & Plan Note (Signed)
Referred back to Pulmonary for CPAP.

## 2022-04-29 NOTE — Assessment & Plan Note (Signed)
Chronic minimal flares in the last year.  Using albuterol as needed as well as allergy treatment

## 2022-04-29 NOTE — Progress Notes (Signed)
Patient ID: Blake Molina, male    DOB: 1966-05-30, 56 y.o.   MRN: [id number]  This visit was conducted in person.  BP 128/72 (BP Location: Left Arm, Patient Position: Sitting, Cuff Size: Normal)   Pulse 72   Temp 98.1 F (36.7 C) (Oral)   Ht '5\' 10"'$  (1.778 m)   Wt 172 lb (78 kg)   SpO2 97%   BMI 24.68 kg/m    CC:  Chief Complaint  Patient presents with   Annual Exam    Subjective:   HPI: Blake Molina is a 56 y.o. male presenting on 04/29/2022 for Annual Exam  The patient presents for annual medicare wellness, complete physical and review of chronic health problems. He/She also has the following acute concerns today: none  Hypertension:  Well-controlled on losartan 50 mg daily BP Readings from Last 3 Encounters:  04/29/22 128/72  11/03/21 130/80  08/27/21 136/88  Using medication without problems or lightheadedness:   none Chest pain with exertion: none Edema:none Short of breath: none Average home BPs: Other issues:  Moderate persistent allergic asthma:  Intermittent flares in the last year.  Associated with dust and shipment of hay. Using albuterol as needed as well as allergy treatment  Snoring: Referred for sleep study in 2022   Mild sleep apnea.  Relevant past medical, surgical, family and social history reviewed and updated as indicated. Interim medical history since our last visit reviewed. Allergies and medications reviewed and updated. Outpatient Medications Prior to Visit  Medication Sig Dispense Refill   albuterol (VENTOLIN HFA) 108 (90 Base) MCG/ACT inhaler Inhale 1-2 puffs into the lungs every 6 (six) hours as needed for wheezing or shortness of breath. 3 each 1   Azelastine HCl 137 MCG/SPRAY SOLN Place 1-2 sprays into the nose as needed.     escitalopram (LEXAPRO) 10 MG tablet Take 1.5 tablets (15 mg total) by mouth daily. 135 tablet 1   fluticasone (FLONASE) 50 MCG/ACT nasal spray Place 2 sprays into both nostrils daily. 16 g 6   guaiFENesin  (MUCINEX) 600 MG 12 hr tablet Take 600 mg by mouth 2 (two) times daily as needed.     lansoprazole (PREVACID) 30 MG capsule Take 1 capsule (30 mg total) by mouth daily at 12 noon. 90 capsule 3   losartan (COZAAR) 50 MG tablet TAKE 1 TABLET BY MOUTH EVERY DAY 90 tablet 0   scopolamine (TRANSDERM-SCOP) 1 MG/3DAYS Place 1 patch onto the skin every three (3) days as needed.     No facility-administered medications prior to visit.     Per HPI unless specifically indicated in ROS section below Review of Systems Objective:  BP 128/72 (BP Location: Left Arm, Patient Position: Sitting, Cuff Size: Normal)   Pulse 72   Temp 98.1 F (36.7 C) (Oral)   Ht '5\' 10"'$  (1.778 m)   Wt 172 lb (78 kg)   SpO2 97%   BMI 24.68 kg/m   Wt Readings from Last 3 Encounters:  04/29/22 172 lb (78 kg)  11/03/21 161 lb 1 oz (73.1 kg)  08/27/21 162 lb 9.6 oz (73.8 kg)      Physical Exam    Results for orders placed or performed in visit on 06/23/21  POC COVID-19  Result Value Ref Range   SARS Coronavirus 2 Ag Negative Negative     COVID 19 screen:  No recent travel or known exposure to COVID19 The patient denies respiratory symptoms of COVID 19 at this time. The importance of  social distancing was discussed today.   Assessment and Plan   The patient's preventative maintenance and recommended screening tests for an annual wellness exam were reviewed in full today. Brought up to date unless services declined.  Counselled on the importance of diet, exercise, and its role in overall health and mortality. The patient's FH and SH was reviewed, including their home life, tobacco status, and drug and alcohol status.   Vaccines: uptodate with  Td.  Flu vaccine:  refused. Considering shingles vaccine. Refused COVID vaccines. Prostate Cancer Screen:   Due for re-eval. Colon Cancer Screen:  01/2019 polyp repeat in 10 years Dr. Allen Norris.      Smoking Status: nonsmoker ETOH/ drug use: rare/ none HIV screen:    Refused   Problem List Items Addressed This Visit     Hypertension, essential, benign (Chronic)    Stable, chronic.  Continue current medication.  Losartan 50 mg daily      Relevant Orders   Lipid panel   Comprehensive metabolic panel   Moderate persistent allergic asthma (Chronic)    Chronic minimal flares in the last year.  Using albuterol as needed as well as allergy treatment      Mild obstructive sleep apnea     Referred back to Pulmonary for CPAP.      Other Visit Diagnoses     Routine general medical examination at a health care facility    -  Primary   Prostate cancer screening       Relevant Orders   PSA        [name], MD

## 2022-04-29 NOTE — Assessment & Plan Note (Signed)
Stable, chronic.  Continue current medication.  Losartan 50 mg daily 

## 2022-05-03 ENCOUNTER — Other Ambulatory Visit: Payer: Self-pay | Admitting: Family Medicine

## 2022-05-10 DIAGNOSIS — R69 Illness, unspecified: Secondary | ICD-10-CM | POA: Diagnosis not present

## 2022-06-15 DIAGNOSIS — R69 Illness, unspecified: Secondary | ICD-10-CM | POA: Diagnosis not present

## 2022-06-25 ENCOUNTER — Other Ambulatory Visit: Payer: Self-pay | Admitting: Family Medicine

## 2022-06-29 ENCOUNTER — Other Ambulatory Visit: Payer: Self-pay | Admitting: Family Medicine

## 2022-06-29 DIAGNOSIS — I10 Essential (primary) hypertension: Secondary | ICD-10-CM

## 2022-07-06 DIAGNOSIS — R69 Illness, unspecified: Secondary | ICD-10-CM | POA: Diagnosis not present

## 2022-07-27 DIAGNOSIS — R69 Illness, unspecified: Secondary | ICD-10-CM | POA: Diagnosis not present

## 2022-09-07 DIAGNOSIS — F4322 Adjustment disorder with anxiety: Secondary | ICD-10-CM | POA: Diagnosis not present

## 2022-09-28 DIAGNOSIS — F4322 Adjustment disorder with anxiety: Secondary | ICD-10-CM | POA: Diagnosis not present

## 2022-10-14 ENCOUNTER — Other Ambulatory Visit: Payer: Self-pay | Admitting: Family Medicine

## 2022-11-03 ENCOUNTER — Encounter: Payer: Self-pay | Admitting: Family Medicine

## 2022-11-04 MED ORDER — ALBUTEROL SULFATE HFA 108 (90 BASE) MCG/ACT IN AERS: 1.0000 | INHALATION_SPRAY | Freq: Four times a day (QID) | RESPIRATORY_TRACT | 1 refills | Status: DC | PRN

## 2022-11-11 DIAGNOSIS — F4322 Adjustment disorder with anxiety: Secondary | ICD-10-CM | POA: Diagnosis not present

## 2022-11-21 ENCOUNTER — Other Ambulatory Visit: Payer: Self-pay | Admitting: Family Medicine

## 2023-02-18 DIAGNOSIS — F4322 Adjustment disorder with anxiety: Secondary | ICD-10-CM | POA: Diagnosis not present

## 2023-04-15 ENCOUNTER — Telehealth: Payer: Self-pay | Admitting: *Deleted

## 2023-04-15 DIAGNOSIS — Z125 Encounter for screening for malignant neoplasm of prostate: Secondary | ICD-10-CM

## 2023-04-15 DIAGNOSIS — I10 Essential (primary) hypertension: Secondary | ICD-10-CM

## 2023-04-15 NOTE — Telephone Encounter (Signed)
-----   Message from Alvina Chou sent at 04/15/2023  2:52 PM EDT ----- Regarding: Lab orders for [facility], 11.14.24 Patient is scheduled for CPX labs, please order future labs, Thanks , Camelia Eng

## 2023-04-28 ENCOUNTER — Other Ambulatory Visit (INDEPENDENT_AMBULATORY_CARE_PROVIDER_SITE_OTHER): Payer: 59

## 2023-04-28 DIAGNOSIS — Z125 Encounter for screening for malignant neoplasm of prostate: Secondary | ICD-10-CM | POA: Diagnosis not present

## 2023-04-28 DIAGNOSIS — I10 Essential (primary) hypertension: Secondary | ICD-10-CM

## 2023-04-28 LAB — LIPID PANEL
Cholesterol: 170 mg/dL (ref 0–200)
HDL: 44.5 mg/dL (ref >39.00)
LDL Cholesterol: 112 mg/dL — ABNORMAL HIGH (ref 0–99)
NonHDL: 125.61
Total CHOL/HDL Ratio: 4
Triglycerides: 70 mg/dL (ref 0.0–149.0)
VLDL: 14 mg/dL (ref 0.0–40.0)

## 2023-04-28 LAB — COMPREHENSIVE METABOLIC PANEL
ALT: 20 U/L (ref 0–53)
AST: 23 U/L (ref 0–37)
Albumin: 4.4 g/dL (ref 3.5–5.2)
Alkaline Phosphatase: 50 U/L (ref 39–117)
BUN: 21 mg/dL (ref 6–23)
CO2: 29 meq/L (ref 19–32)
Calcium: 9.6 mg/dL (ref 8.4–10.5)
Chloride: 105 meq/L (ref 96–112)
Creatinine, Ser: 1.07 mg/dL (ref 0.40–1.50)
GFR: 77.33 mL/min (ref >60.00)
Glucose, Bld: 93 mg/dL (ref 70–99)
Potassium: 4.5 meq/L (ref 3.5–5.1)
Sodium: 139 meq/L (ref 135–145)
Total Bilirubin: 0.6 mg/dL (ref 0.2–1.2)
Total Protein: 6.8 g/dL (ref 6.0–8.3)

## 2023-04-28 LAB — PSA: PSA: 0.79 ng/mL (ref 0.10–4.00)

## 2023-04-29 NOTE — Progress Notes (Signed)
No critical labs need to be addressed urgently. We will discuss labs in detail at upcoming office visit.   

## 2023-05-05 ENCOUNTER — Ambulatory Visit (INDEPENDENT_AMBULATORY_CARE_PROVIDER_SITE_OTHER): Payer: 59 | Admitting: Family Medicine

## 2023-05-05 ENCOUNTER — Encounter: Payer: Self-pay | Admitting: Family Medicine

## 2023-05-05 VITALS — BP 134/80 | HR 92 | Temp 98.5°F | Ht 68.5 in | Wt 177.2 lb

## 2023-05-05 DIAGNOSIS — Z0000 Encounter for general adult medical examination without abnormal findings: Secondary | ICD-10-CM | POA: Diagnosis not present

## 2023-05-05 DIAGNOSIS — G4733 Obstructive sleep apnea (adult) (pediatric): Secondary | ICD-10-CM | POA: Diagnosis not present

## 2023-05-05 DIAGNOSIS — M7041 Prepatellar bursitis, right knee: Secondary | ICD-10-CM | POA: Diagnosis not present

## 2023-05-05 DIAGNOSIS — J4540 Moderate persistent asthma, uncomplicated: Secondary | ICD-10-CM

## 2023-05-05 DIAGNOSIS — F418 Other specified anxiety disorders: Secondary | ICD-10-CM | POA: Diagnosis not present

## 2023-05-05 DIAGNOSIS — I10 Essential (primary) hypertension: Secondary | ICD-10-CM | POA: Diagnosis not present

## 2023-05-05 MED ORDER — LANSOPRAZOLE 30 MG PO CPDR: 30.0000 mg | DELAYED_RELEASE_CAPSULE | Freq: Every day | ORAL | 3 refills | Status: DC

## 2023-05-05 MED ORDER — BUDESONIDE-FORMOTEROL FUMARATE 80-4.5 MCG/ACT IN AERO: 2.0000 | INHALATION_SPRAY | Freq: Two times a day (BID) | RESPIRATORY_TRACT | 11 refills | Status: AC

## 2023-05-05 MED ORDER — ALBUTEROL SULFATE HFA 108 (90 BASE) MCG/ACT IN AERS: 1.0000 | INHALATION_SPRAY | Freq: Four times a day (QID) | RESPIRATORY_TRACT | 1 refills | Status: DC | PRN

## 2023-05-05 NOTE — Assessment & Plan Note (Signed)
Acute  Use diclofenac  gel 4 times daily. Avoid crawl space work, or kneeling .  Start home PT... If not improving as expected consider following up with sports medicine at our office for possible steroid injection.

## 2023-05-05 NOTE — Assessment & Plan Note (Addendum)
Chronic  Symbicort daily. minimal flares in the last year.  Using albuterol as needed as well as allergy treatment

## 2023-05-05 NOTE — Assessment & Plan Note (Signed)
Chronic, improved control on Lexapro 15 mg daily

## 2023-05-05 NOTE — Progress Notes (Signed)
Patient ID: Blake Molina, male    DOB: Oct 13, 1965, 57 y.o.   MRN: [id number]  This visit was conducted in person.  BP 134/80 (BP Location: Right Arm, Patient Position: Sitting, Cuff Size: Normal)   Pulse 92   Temp 98.5 F (36.9 C) (Temporal)   Ht 5' 8.5" (1.74 m)   Wt 177 lb 4 oz (80.4 kg)   SpO2 98%   BMI 26.56 kg/m    CC:  Chief Complaint  Patient presents with   Annual Exam    Subjective:   HPI: Blake Molina is a 57 y.o. male presenting on 05/05/2023 for Annual Exam  The patient presents for complete physical and review of chronic health problems. He/She also has the following acute concerns today:  has noted swelling in right knee... Crawls in crawl spaces at work  2-3 weeks  Hypertension:  Well-controlled on losartan 50 mg daily BP Readings from Last 3 Encounters:  05/05/23 134/80  04/29/22 128/72  11/03/21 130/80  Using medication without problems or lightheadedness:   none Chest pain with exertion: none Edema:none Short of breath: none Average home BPs: Other issues:  Moderate persistent allergic asthma:  Using symbicort daily Intermittent flares in the last year.  Associated with dust and shipment of hay. Using albuterol as needed as well as allergy treatment  Snoring: Referred for sleep study 2022 : Showed mild obstructive sleep apnea  Reviewed labs with patient Lab Results  Component Value Date   CHOL 170 04/28/2023   HDL 44.50 04/28/2023   LDLCALC 112 (H) 04/28/2023   TRIG 70.0 04/28/2023   CHOLHDL 4 04/28/2023  The 10-year ASCVD risk score (Arnett DK, et al., 2019) is: 7.1%   Values used to calculate the score:     Age: 60 years     Sex: Male     Is Non-Hispanic African American: No     Diabetic: No     Tobacco smoker: No     Systolic Blood Pressure: 134 mmHg     Is BP treated: Yes     HDL Cholesterol: 44.5 mg/dL     Total Cholesterol: 170 mg/dL   Relevant past medical, surgical, family and social history reviewed and updated as  indicated. Interim medical history since our last visit reviewed. Allergies and medications reviewed and updated. Outpatient Medications Prior to Visit  Medication Sig Dispense Refill   escitalopram (LEXAPRO) 10 MG tablet TAKE 1 AND 1/2 TABLETS DAILY BY MOUTH 135 tablet 1   fluticasone (FLONASE) 50 MCG/ACT nasal spray Place 2 sprays into both nostrils daily. 16 g 6   losartan (COZAAR) 50 MG tablet TAKE 1 TABLET BY MOUTH EVERY DAY 90 tablet 3   albuterol (VENTOLIN HFA) 108 (90 Base) MCG/ACT inhaler Inhale 1-2 puffs into the lungs every 6 (six) hours as needed for wheezing or shortness of breath. 3 each 1   lansoprazole (PREVACID) 30 MG capsule Take 30 mg by mouth daily.     Azelastine HCl 137 MCG/SPRAY SOLN Place 1-2 sprays into the nose as needed.     guaiFENesin (MUCINEX) 600 MG 12 hr tablet Take 600 mg by mouth 2 (two) times daily as needed.     lansoprazole (PREVACID) 30 MG capsule Take 1 capsule (30 mg total) by mouth daily at 12 noon. 90 capsule 3   scopolamine (TRANSDERM-SCOP) 1 MG/3DAYS Place 1 patch onto the skin every three (3) days as needed.     No facility-administered medications prior to visit.  Per HPI unless specifically indicated in ROS section below Review of Systems  Constitutional:  Negative for fatigue and fever.  HENT:  Negative for ear pain.   Eyes:  Negative for pain.  Respiratory:  Negative for cough and shortness of breath.   Cardiovascular:  Negative for chest pain, palpitations and leg swelling.  Gastrointestinal:  Negative for abdominal pain.  Genitourinary:  Negative for dysuria.  Musculoskeletal:  Negative for arthralgias.  Neurological:  Negative for syncope, light-headedness and headaches.  Psychiatric/Behavioral:  Negative for dysphoric mood.    Objective:  BP 134/80 (BP Location: Right Arm, Patient Position: Sitting, Cuff Size: Normal)   Pulse 92   Temp 98.5 F (36.9 C) (Temporal)   Ht 5' 8.5" (1.74 m)   Wt 177 lb 4 oz (80.4 kg)   SpO2 98%    BMI 26.56 kg/m   Wt Readings from Last 3 Encounters:  05/05/23 177 lb 4 oz (80.4 kg)  04/29/22 172 lb (78 kg)  11/03/21 161 lb 1 oz (73.1 kg)      Physical Exam Constitutional:      General: He is not in acute distress.    Appearance: Normal appearance. He is well-developed. He is not ill-appearing or toxic-appearing.  HENT:     Head: Normocephalic and atraumatic.     Right Ear: Hearing, tympanic membrane, ear canal and external ear normal.     Left Ear: Hearing, tympanic membrane, ear canal and external ear normal.     Nose: Nose normal.     Mouth/Throat:     Mouth: Oropharynx is clear and moist and mucous membranes are normal.     Pharynx: Uvula midline.  Eyes:     General: Lids are normal. Lids are everted, no foreign bodies appreciated.     Extraocular Movements: EOM normal.     Conjunctiva/sclera: Conjunctivae normal.     Pupils: Pupils are equal, round, and reactive to light.  Neck:     Thyroid: No thyroid mass or thyromegaly.     Vascular: No carotid bruit.     Trachea: Trachea and phonation normal.  Cardiovascular:     Rate and Rhythm: Normal rate and regular rhythm.     Pulses: Normal pulses.     Heart sounds: S1 normal and S2 normal. No murmur heard.    No gallop.  Pulmonary:     Breath sounds: Normal breath sounds. No wheezing, rhonchi or rales.  Abdominal:     General: Bowel sounds are normal.     Palpations: Abdomen is soft.     Tenderness: There is no abdominal tenderness. There is no CVA tenderness, guarding or rebound.     Hernia: No hernia is present.  Musculoskeletal:     Cervical back: Normal range of motion and neck supple.     Right knee: Swelling present. No tenderness.  Lymphadenopathy:     Cervical: No cervical adenopathy.  Skin:    General: Skin is warm, dry and intact.     Findings: No rash.  Neurological:     Mental Status: He is alert.     Cranial Nerves: No cranial nerve deficit.     Sensory: No sensory deficit.     Gait: Gait normal.      Deep Tendon Reflexes: Reflexes are normal and symmetric.  Psychiatric:        Mood and Affect: Mood and affect normal.        Speech: Speech normal.        Behavior: Behavior normal.  Judgment: Judgment normal.       Results for orders placed or performed in visit on 04/28/23  PSA  Result Value Ref Range   PSA 0.79 0.10 - 4.00 ng/mL  Comprehensive metabolic panel  Result Value Ref Range   Sodium 139 135 - 145 mEq/L   Potassium 4.5 3.5 - 5.1 mEq/L   Chloride 105 96 - 112 mEq/L   CO2 29 19 - 32 mEq/L   Glucose, Bld 93 70 - 99 mg/dL   BUN 21 6 - 23 mg/dL   Creatinine, Ser 4.09 0.40 - 1.50 mg/dL   Total Bilirubin 0.6 0.2 - 1.2 mg/dL   Alkaline Phosphatase 50 39 - 117 U/L   AST 23 0 - 37 U/L   ALT 20 0 - 53 U/L   Total Protein 6.8 6.0 - 8.3 g/dL   Albumin 4.4 3.5 - 5.2 g/dL   GFR 81.19 >14.78 mL/min   Calcium 9.6 8.4 - 10.5 mg/dL  Lipid panel  Result Value Ref Range   Cholesterol 170 0 - 200 mg/dL   Triglycerides 29.5 0.0 - 149.0 mg/dL   HDL 62.13 >08.65 mg/dL   VLDL 78.4 0.0 - 69.6 mg/dL   LDL Cholesterol 295 (H) 0 - 99 mg/dL   Total CHOL/HDL Ratio 4    NonHDL 125.61      COVID 19 screen:  No recent travel or known exposure to COVID19 The patient denies respiratory symptoms of COVID 19 at this time. The importance of social distancing was discussed today.   Assessment and Plan   The patient's preventative maintenance and recommended screening tests for an annual wellness exam were reviewed in full today. Brought up to date unless services declined.  Counselled on the importance of diet, exercise, and its role in overall health and mortality. The patient's FH and SH was reviewed, including their home life, tobacco status, and drug and alcohol status.   Vaccines: uptodate with  Td.  Flu/ COVID vaccine:refused. Considering shingles vaccine. Prostate Cancer Screen: Chronic, stable Lab Results  Component Value Date   PSA1 0.7 09/23/2020   PSA1 0.8  11/09/2018   PSA1 0.8 11/02/2017   PSA 0.79 04/28/2023   PSA 0.68 04/29/2022   PSA 0.77 08/15/2018  Colon Cancer Screen:  01/2019 polyp repeat in 10 years Dr. Servando Snare. Smoking Status: nonsmoker ETOH/ drug use: rare/ none HIV screen:   Refused   Problem List Items Addressed This Visit     Hypertension, essential, benign (Chronic)    Stable, chronic.  Continue current medication.  Losartan 50 mg daily      Mild obstructive sleep apnea     Discussed  returning to set up CPAP.Marland Kitchen info provided to the patient.      Moderate persistent allergic asthma (Chronic)    Chronic  Symbicort daily. minimal flares in the last year.  Using albuterol as needed as well as allergy treatment      Relevant Medications   albuterol (VENTOLIN HFA) 108 (90 Base) MCG/ACT inhaler   budesonide-formoterol (SYMBICORT) 80-4.5 MCG/ACT inhaler   Prepatellar bursitis of right knee     Acute  Use diclofenac  gel 4 times daily. Avoid crawl space work, or kneeling .  Start home PT... If not improving as expected consider following up with sports medicine at our office for possible steroid injection.      Situational anxiety    Chronic, improved control on Lexapro 15 mg daily      Other Visit Diagnoses  Routine general medical examination at a health care facility    -  Primary         Kerby Nora, MD

## 2023-05-05 NOTE — Assessment & Plan Note (Signed)
Stable, chronic.  Continue current medication.  Losartan 50 mg daily 

## 2023-05-05 NOTE — Assessment & Plan Note (Signed)
Discussed  returning to set up CPAP.Marland Kitchen info provided to the patient.

## 2023-05-05 NOTE — Patient Instructions (Addendum)
Call Dr. Craige Cotta Sleep specialist to follow up on sleep apnea: Phone: [phone]  Work on low cholesterol diet... heart healthy diet.   Pre-patellar bursitis  Use diclofenac  gel 4 times daily. Avoid crawl space work, or kneeling .

## 2023-05-29 ENCOUNTER — Other Ambulatory Visit: Payer: Self-pay | Admitting: Family Medicine

## 2023-06-25 ENCOUNTER — Other Ambulatory Visit: Payer: Self-pay | Admitting: Family Medicine

## 2023-06-29 ENCOUNTER — Other Ambulatory Visit: Payer: Self-pay | Admitting: Family Medicine

## 2023-06-29 DIAGNOSIS — I10 Essential (primary) hypertension: Secondary | ICD-10-CM

## 2023-07-11 ENCOUNTER — Ambulatory Visit: Payer: Self-pay | Admitting: Family Medicine

## 2023-07-11 NOTE — Telephone Encounter (Signed)
Copied from CRM [id number]. Topic: Clinical - Red Word Triage >> Jul 11, 2023  9:26 AM Louie Boston wrote: Red Word that prompted transfer to Nurse Triage: Worsening Symptoms  Chief Complaint: cough Symptoms: cough, congestion/yellow sputum, SOB at times, wheezing at times. Frequency: 3 to 4 weeks and worsening Pertinent Negatives: Patient denies chest pain Disposition: [] ED /[] Urgent Care (no appt availability in office) / [x] Appointment(In office/virtual)/ []  [facility]/ [] Home Care/ [] Refused Recommended Disposition /[] [facility]/ []  Follow-up with PCP Additional Notes: pt using asthma medication: using medication more than normal and concerned about how many times he has to use .  Reason for Disposition  SEVERE coughing spells (e.g., whooping sound after coughing, vomiting after coughing)  Answer Assessment - Initial Assessment Questions 1. ONSET: "When did the cough begin?"      X3 to 4 weeks and worsening 2. SEVERITY: "How bad is the cough today?"      Cough worsening  3. SPUTUM: "Describe the color of your sputum" (none, dry cough; clear, white, yellow, green)     Yellowish, hard to come and wheezing 4. HEMOPTYSIS: "Are you coughing up any blood?" If so ask: "How much?" (flecks, streaks, tablespoons, etc.)     none 5. DIFFICULTY BREATHING: "Are you having difficulty breathing?" If Yes, ask: "How bad is it?" (e.g., mild, moderate, severe)    - MILD: No SOB at rest, mild SOB with walking, speaks normally in sentences, can lie down, no retractions, pulse < 100.    - MODERATE: SOB at rest, SOB with minimal exertion and prefers to sit, cannot lie down flat, speaks in phrases, mild retractions, audible wheezing, pulse 100-120.    - SEVERE: Very SOB at rest, speaks in single words, struggling to breathe, sitting hunched forward, retractions, pulse > 120      Moderate at times - using inhaler couple times a day and normally uses maybe 1 / week 6. FEVER: "Do you have a  fever?" If Yes, ask: "What is your temperature, how was it measured, and when did it start?"     no 7. CARDIAC HISTORY: "Do you have any history of heart disease?" (e.g., heart attack, congestive heart failure)      N/a 8. LUNG HISTORY: "Do you have any history of lung disease?"  (e.g., pulmonary embolus, asthma, emphysema)     asthma 9. PE RISK FACTORS: "Do you have a history of blood clots?" (or: recent major surgery, recent prolonged travel, bedridden)     N/a 10. OTHER SYMPTOMS: "Do you have any other symptoms?" (e.g., runny nose, wheezing, chest pain)       wheezing 11. PREGNANCY: "Is there any chance you are pregnant?" "When was your last menstrual period?"       N/a 12. TRAVEL: "Have you traveled out of the country in the last month?" (e.g., travel history, exposures)       N/a  Protocols used: Cough - Acute Non-Productive-A-AH

## 2023-07-12 ENCOUNTER — Other Ambulatory Visit: Payer: Self-pay | Admitting: Family Medicine

## 2023-07-12 ENCOUNTER — Encounter: Payer: Self-pay | Admitting: Family Medicine

## 2023-07-12 ENCOUNTER — Ambulatory Visit (INDEPENDENT_AMBULATORY_CARE_PROVIDER_SITE_OTHER): Payer: Self-pay | Admitting: Family Medicine

## 2023-07-12 VITALS — BP 122/68 | HR 72 | Temp 97.9°F | Ht 68.5 in | Wt 182.5 lb

## 2023-07-12 DIAGNOSIS — R140 Abdominal distension (gaseous): Secondary | ICD-10-CM | POA: Insufficient documentation

## 2023-07-12 DIAGNOSIS — J4540 Moderate persistent asthma, uncomplicated: Secondary | ICD-10-CM

## 2023-07-12 MED ORDER — PREDNISONE 20 MG PO TABS: ORAL_TABLET | ORAL | 0 refills | Status: DC

## 2023-07-12 MED ORDER — AIRSUPRA 90-80 MCG/ACT IN AERO: 2.0000 | INHALATION_SPRAY | RESPIRATORY_TRACT | 3 refills | Status: DC | PRN

## 2023-07-12 NOTE — Telephone Encounter (Signed)
Spoke with Blake Molina.  He states his daughter,  who is a Teacher, early years/pre, is suppose to pick it up for him and hopefully she knows about the coupon but if he has trouble getting it he know to continue using the albuterol that he already has a prescription for.

## 2023-07-12 NOTE — Progress Notes (Signed)
Patient ID: Blake Molina, male    DOB: Feb 17, 1966, 58 y.o.   MRN: [id number]  This visit was conducted in person.  BP 122/68 (BP Location: Left Arm, Patient Position: Sitting, Cuff Size: Large)   Pulse 72   Temp 97.9 F (36.6 C) (Temporal)   Ht 5' 8.5" (1.74 m)   Wt 182 lb 8 oz (82.8 kg)   SpO2 98%   BMI 27.35 kg/m    CC:  Chief Complaint  Patient presents with   Asthma    Using Inhaler more frequently Using Symbicort bid   Cough    With Thick Sticky Phlegm-Yellow   Wheezing   Bloated    After eating and URQ discomfort    Subjective:   HPI: Blake Molina is a 58 y.o. male presenting on 07/12/2023 for Asthma (Using Inhaler more frequently/Using Symbicort bid), Cough (With Thick Sticky Phlegm-Yellow), Wheezing, and Bloated (After eating and URQ discomfort)  History of moderate persistent asthma on Symbicort daily.  Previously minimal flares using albuterol as needed.  Now in the last few weeks he has notred gradaully worsening asthma.  Has noted increase in mucus, phlegm.  More coughing spells  Increase in SOB  with activity, increased wheezing.  No Fever. No ST, no ear pain.   1 week ago increase Symbicort to BID... helps some. ( Previously was fine with once daily)    Using albuterol 1-3 times a day.   No new allergens.. May be worse since COVID few months ago.    Outside in cold more this time of year.    Feeling bloated in RUQ after eating.. no matter what he eats. No reflux... prevacid 30 mg daily   Relevant past medical, surgical, family and social history reviewed and updated as indicated. Interim medical history since our last visit reviewed. Allergies and medications reviewed and updated. Outpatient Medications Prior to Visit  Medication Sig Dispense Refill   budesonide-formoterol (SYMBICORT) 80-4.5 MCG/ACT inhaler Inhale 2 puffs into the lungs 2 (two) times daily. 1 each 11   escitalopram (LEXAPRO) 10 MG tablet TAKE 1 AND 1/2 TABLETS BY MOUTH DAILY  135 tablet 1   fluticasone (FLONASE) 50 MCG/ACT nasal spray Place 2 sprays into both nostrils daily. 16 g 6   lansoprazole (PREVACID) 30 MG capsule Take 1 capsule (30 mg total) by mouth daily. 90 capsule 3   losartan (COZAAR) 50 MG tablet TAKE 1 TABLET BY MOUTH EVERY DAY 30 tablet 14   albuterol (VENTOLIN HFA) 108 (90 Base) MCG/ACT inhaler Inhale 1-2 puffs into the lungs every 6 (six) hours as needed for wheezing or shortness of breath. 3 each 1   No facility-administered medications prior to visit.     Per HPI unless specifically indicated in ROS section below Review of Systems  Constitutional:  Negative for fatigue and fever.  HENT:  Negative for ear pain.   Eyes:  Negative for pain.  Respiratory:  Positive for cough, shortness of breath and wheezing.   Cardiovascular:  Negative for chest pain, palpitations and leg swelling.  Gastrointestinal:  Positive for abdominal distention. Negative for abdominal pain.  Genitourinary:  Negative for dysuria.  Musculoskeletal:  Negative for arthralgias.  Neurological:  Negative for syncope, light-headedness and headaches.  Psychiatric/Behavioral:  Negative for dysphoric mood.    Objective:  BP 122/68 (BP Location: Left Arm, Patient Position: Sitting, Cuff Size: Large)   Pulse 72   Temp 97.9 F (36.6 C) (Temporal)   Ht 5' 8.5" (1.74 m)  Wt 182 lb 8 oz (82.8 kg)   SpO2 98%   BMI 27.35 kg/m   Wt Readings from Last 3 Encounters:  07/12/23 182 lb 8 oz (82.8 kg)  05/05/23 177 lb 4 oz (80.4 kg)  04/29/22 172 lb (78 kg)      Physical Exam Constitutional:      Appearance: He is well-developed.  HENT:     Head: Normocephalic.     Right Ear: Hearing normal.     Left Ear: Hearing normal.     Nose: Nose normal.  Neck:     Thyroid: No thyroid mass or thyromegaly.     Vascular: No carotid bruit.     Trachea: Trachea normal.  Cardiovascular:     Rate and Rhythm: Normal rate and regular rhythm.     Pulses: Normal pulses.     Heart sounds:  Heart sounds not distant. No murmur heard.    No friction rub. No gallop.     Comments: No peripheral edema Pulmonary:     Effort: Pulmonary effort is normal. No respiratory distress.     Breath sounds: Normal breath sounds. No decreased breath sounds, wheezing, rhonchi or rales.  Abdominal:     Tenderness: There is no abdominal tenderness. There is no right CVA tenderness or left CVA tenderness.     Hernia: No hernia is present.  Skin:    General: Skin is warm and dry.     Findings: No rash.  Psychiatric:        Speech: Speech normal.        Behavior: Behavior normal.        Thought Content: Thought content normal.       Results for orders placed or performed in visit on 04/28/23  PSA   Collection Time: 04/28/23  8:05 AM  Result Value Ref Range   PSA 0.79 0.10 - 4.00 ng/mL  Comprehensive metabolic panel   Collection Time: 04/28/23  8:05 AM  Result Value Ref Range   Sodium 139 135 - 145 mEq/L   Potassium 4.5 3.5 - 5.1 mEq/L   Chloride 105 96 - 112 mEq/L   CO2 29 19 - 32 mEq/L   Glucose, Bld 93 70 - 99 mg/dL   BUN 21 6 - 23 mg/dL   Creatinine, Ser 9.14 0.40 - 1.50 mg/dL   Total Bilirubin 0.6 0.2 - 1.2 mg/dL   Alkaline Phosphatase 50 39 - 117 U/L   AST 23 0 - 37 U/L   ALT 20 0 - 53 U/L   Total Protein 6.8 6.0 - 8.3 g/dL   Albumin 4.4 3.5 - 5.2 g/dL   GFR 78.29 >56.21 mL/min   Calcium 9.6 8.4 - 10.5 mg/dL  Lipid panel   Collection Time: 04/28/23  8:05 AM  Result Value Ref Range   Cholesterol 170 0 - 200 mg/dL   Triglycerides 30.8 0.0 - 149.0 mg/dL   HDL 65.78 >46.96 mg/dL   VLDL 29.5 0.0 - 28.4 mg/dL   LDL Cholesterol 132 (H) 0 - 99 mg/dL   Total CHOL/HDL Ratio 4    NonHDL 125.61     Assessment and Plan  Moderate persistent allergic asthma Assessment & Plan: Chronic, with acute worsening unclear etiology.  No clear viral or bacterial infection.  No clear new allergens.  May be triggered by change in temperature and work outside in the cold versus  post-COVID. Will treat with prednisone taper.  Continue increased dose of Symbicort to recommended dose 2 puffs twice daily. Change  albuterol to Airsupra if coupon card works for patient given associated symptom treatment and prevention of attacks.   Abdominal bloating Assessment & Plan: Acute, symptoms are mild and no pain.  Mainly in right upper quadrant.  Symptoms seem to be associated with any food but patient has very high fat diet including frequent cheeseburgers. Symptoms may be associated with gallbladder dysfunction.  Encouraged him to increase water, decrease fatty foods avoid foods that have increased association with bloating and consider probiotic. If not improving will move forward with right upper quadrant ultrasound.   Other orders -     predniSONE; 3 tabs by mouth daily x 3 days, then 2 tabs by mouth daily x 2 days then 1 tab by mouth daily x 2 days  Dispense: 15 tablet; Refill: 0 -     Airsupra; Inhale 2 puffs into the lungs every 4 (four) hours as needed.  Dispense: 5.9 g; Refill: 3    No follow-ups on file.   Kerby Nora, MD

## 2023-07-12 NOTE — Telephone Encounter (Signed)
Call pharmacy, patient was given a coupon card.  If it is not covered by insurance even with coupon card he can just continue using albuterol.  Patient already has a prescription for this

## 2023-07-12 NOTE — Assessment & Plan Note (Signed)
Acute, symptoms are mild and no pain.  Mainly in right upper quadrant.  Symptoms seem to be associated with any food but patient has very high fat diet including frequent cheeseburgers. Symptoms may be associated with gallbladder dysfunction.  Encouraged him to increase water, decrease fatty foods avoid foods that have increased association with bloating and consider probiotic. If not improving will move forward with right upper quadrant ultrasound.

## 2023-07-12 NOTE — Telephone Encounter (Signed)
Alternative Requested:NOT COVERED BY INS.

## 2023-07-12 NOTE — Assessment & Plan Note (Signed)
Chronic, with acute worsening unclear etiology.  No clear viral or bacterial infection.  No clear new allergens.  May be triggered by change in temperature and work outside in the cold versus post-COVID. Will treat with prednisone taper.  Continue increased dose of Symbicort to recommended dose 2 puffs twice daily. Change albuterol to Airsupra if coupon card works for patient given associated symptom treatment and prevention of attacks.

## 2023-07-21 ENCOUNTER — Ambulatory Visit: Payer: BC Managed Care – PPO | Admitting: Family Medicine

## 2023-07-21 ENCOUNTER — Encounter: Payer: Self-pay | Admitting: Family Medicine

## 2023-07-21 VITALS — BP 102/70 | HR 83 | Temp 99.2°F | Ht 68.5 in | Wt 179.0 lb

## 2023-07-21 DIAGNOSIS — Z20828 Contact with and (suspected) exposure to other viral communicable diseases: Secondary | ICD-10-CM | POA: Insufficient documentation

## 2023-07-21 DIAGNOSIS — R6889 Other general symptoms and signs: Secondary | ICD-10-CM | POA: Diagnosis not present

## 2023-07-21 LAB — POCT INFLUENZA A/B
Influenza A, POC: NEGATIVE
Influenza B, POC: NEGATIVE

## 2023-07-21 MED ORDER — OSELTAMIVIR PHOSPHATE 75 MG PO CAPS: 75.0000 mg | ORAL_CAPSULE | Freq: Two times a day (BID) | ORAL | 0 refills | Status: DC

## 2023-07-21 NOTE — Assessment & Plan Note (Signed)
 Acute, exposure to flu, multiple family members.  Patient with presumed flu despite negative test in office.  Will treat with Tamiflu  given increased risk with moderate persistent asthma. No current sign of asthma flare at this time.  Continue Symbicort  2 puffs twice daily and Airsupra  as needed. No clear indication for chest x-ray or repeat course of prednisone .  Recommend Mucinex, pushing fluid keeping up p.o. intake and rest.  ER precautions provided

## 2023-07-21 NOTE — Progress Notes (Signed)
 Patient ID: Blake Molina, male    DOB: 03/13/1966, 58 y.o.   MRN: [id number]  This visit was conducted in person.  BP 102/70 (BP Location: Left Arm, Patient Position: Sitting, Cuff Size: Normal)   Pulse 83   Temp 99.2 F (37.3 C) (Oral)   Ht 5' 8.5 (1.74 m)   Wt 179 lb (81.2 kg)   SpO2 98%   BMI 26.82 kg/m    CC:  Chief Complaint  Patient presents with   Acute Visit    Reports fever, no appetite, body aches, cough x4 days. Flu contacts.    Subjective:   HPI: Blake Molina is a 58 y.o. male presenting on 07/21/2023 for Acute Visit (Reports fever, no appetite, body aches, cough x4 days. Flu contacts.)   Was getting better for asthma flare following changes from last week.  Date of onset: 4 days Initial symptoms included  sweating, fever 101-102F Symptoms progressed to body aches, cough and congestion.  No ear pain, no face pain, no ST.  No SOB, no wheeze hears funny sound when breathing like carbonation in throat.    Sick contacts:  family members... positive for flu. COVID testing:   none     He has tried to treat with  OTC meds.   Minimal po intake, moderate water .      History of moderate persistent asthma,  Symbicort2 puffs twice daily.  Airsupra  prn Non-smoker.       Relevant past medical, surgical, family and social history reviewed and updated as indicated. Interim medical history since our last visit reviewed. Allergies and medications reviewed and updated. Outpatient Medications Prior to Visit  Medication Sig Dispense Refill   Albuterol -Budesonide  (AIRSUPRA ) 90-80 MCG/ACT AERO TAKE 2 PUFFS BY MOUTH EVERY 4 HOURS AS NEEDED 5.9 g 3   budesonide -formoterol  (SYMBICORT ) 80-4.5 MCG/ACT inhaler Inhale 2 puffs into the lungs 2 (two) times daily. 1 each 11   escitalopram  (LEXAPRO ) 10 MG tablet TAKE 1 AND 1/2 TABLETS BY MOUTH DAILY 135 tablet 1   fluticasone  (FLONASE ) 50 MCG/ACT nasal spray Place 2 sprays into both nostrils daily. 16 g 6   lansoprazole   (PREVACID ) 30 MG capsule Take 1 capsule (30 mg total) by mouth daily. 90 capsule 3   losartan  (COZAAR ) 50 MG tablet TAKE 1 TABLET BY MOUTH EVERY DAY 30 tablet 14   predniSONE  (DELTASONE ) 20 MG tablet 3 tabs by mouth daily x 3 days, then 2 tabs by mouth daily x 2 days then 1 tab by mouth daily x 2 days 15 tablet 0   No facility-administered medications prior to visit.     Per HPI unless specifically indicated in ROS section below Review of Systems  Constitutional:  Positive for fatigue and fever.  HENT:  Negative for ear pain, sinus pressure, sinus pain and sore throat.   Eyes:  Negative for pain.  Respiratory:  Positive for cough. Negative for shortness of breath and wheezing.   Cardiovascular:  Negative for chest pain, palpitations and leg swelling.  Gastrointestinal:  Negative for abdominal pain.  Genitourinary:  Negative for dysuria.  Musculoskeletal:  Negative for arthralgias.  Neurological:  Negative for syncope, light-headedness and headaches.  Psychiatric/Behavioral:  Negative for dysphoric mood.    Objective:  BP 102/70 (BP Location: Left Arm, Patient Position: Sitting, Cuff Size: Normal)   Pulse 83   Temp 99.2 F (37.3 C) (Oral)   Ht 5' 8.5 (1.74 m)   Wt 179 lb (81.2 kg)   SpO2 98%  BMI 26.82 kg/m   Wt Readings from Last 3 Encounters:  07/21/23 179 lb (81.2 kg)  07/12/23 182 lb 8 oz (82.8 kg)  05/05/23 177 lb 4 oz (80.4 kg)      Physical Exam Constitutional:      Appearance: He is well-developed.  HENT:     Head: Normocephalic.     Right Ear: Hearing normal.     Left Ear: Hearing normal.     Nose: Nose normal.  Neck:     Thyroid: No thyroid mass or thyromegaly.     Vascular: No carotid bruit.     Trachea: Trachea normal.  Cardiovascular:     Rate and Rhythm: Normal rate and regular rhythm.     Pulses: Normal pulses.     Heart sounds: Heart sounds not distant. No murmur heard.    No friction rub. No gallop.     Comments: No peripheral edema Pulmonary:      Effort: Pulmonary effort is normal. No respiratory distress.     Breath sounds: Rhonchi present.  Skin:    General: Skin is warm and dry.     Findings: No rash.  Psychiatric:        Speech: Speech normal.        Behavior: Behavior normal.        Thought Content: Thought content normal.       Results for orders placed or performed in visit on 07/21/23  Influenza A/B   Collection Time: 07/21/23  4:11 PM  Result Value Ref Range   Influenza A, POC Negative Negative   Influenza B, POC Negative Negative    Assessment and Plan  Exposure to influenza -     POCT Influenza A/B  Flu-like symptoms Assessment & Plan: Acute, exposure to flu, multiple family members.  Patient with presumed flu despite negative test in office.  Will treat with Tamiflu  given increased risk with moderate persistent asthma. No current sign of asthma flare at this time.  Continue Symbicort  2 puffs twice daily and Airsupra  as needed. No clear indication for chest x-ray or repeat course of prednisone .  Recommend Mucinex, pushing fluid keeping up p.o. intake and rest.  ER precautions provided   Other orders -     Oseltamivir  Phosphate; Take 1 capsule (75 mg total) by mouth 2 (two) times daily.  Dispense: 10 capsule; Refill: 0    No follow-ups on file.   Greig Ring, MD

## 2023-07-25 ENCOUNTER — Encounter: Payer: Self-pay | Admitting: Family Medicine

## 2023-07-25 ENCOUNTER — Telehealth: Payer: Self-pay

## 2023-07-25 NOTE — Telephone Encounter (Signed)
 Copied from CRM [id number]. Topic: Clinical - Medication Question >> Jul 25, 2023 12:01 PM [place] wrote: Reason for CRM: Patient was told to call back if she was still not feeling well and he could get another prescription for steroids called in as he is still not feeling well

## 2023-07-26 MED ORDER — PREDNISONE 20 MG PO TABS: ORAL_TABLET | ORAL | 0 refills | Status: DC

## 2023-07-26 NOTE — Addendum Note (Signed)
Addended by: Kerby Nora E on: 07/26/2023 05:23 PM   Modules accepted: Orders

## 2023-07-26 NOTE — Telephone Encounter (Signed)
Prescription for longer slower course of prednisone given.  Please verify patient has started Symbicort 2 puffs twice daily. If cough wheezing and shortness of breath has not improved with this as expected have him make a follow-up appointment

## 2023-07-27 NOTE — Telephone Encounter (Signed)
[place] notified as instructed by telephone.  Patient states he is using the Symbicort as prescribed.

## 2023-12-24 ENCOUNTER — Other Ambulatory Visit: Payer: Self-pay | Admitting: Family Medicine

## 2024-03-20 ENCOUNTER — Other Ambulatory Visit: Payer: Self-pay | Admitting: Family Medicine

## 2024-03-20 DIAGNOSIS — I10 Essential (primary) hypertension: Secondary | ICD-10-CM

## 2024-03-25 ENCOUNTER — Other Ambulatory Visit: Payer: Self-pay | Admitting: Family Medicine

## 2024-05-08 ENCOUNTER — Encounter: Payer: 59 | Admitting: Family Medicine

## 2024-05-16 ENCOUNTER — Ambulatory Visit: Payer: Self-pay | Admitting: Family Medicine

## 2024-05-16 ENCOUNTER — Ambulatory Visit (INDEPENDENT_AMBULATORY_CARE_PROVIDER_SITE_OTHER): Admitting: Family Medicine

## 2024-05-16 ENCOUNTER — Encounter: Payer: Self-pay | Admitting: Family Medicine

## 2024-05-16 VITALS — BP 120/78 | HR 74 | Temp 97.8°F | Ht 70.0 in | Wt 183.1 lb

## 2024-05-16 DIAGNOSIS — G4733 Obstructive sleep apnea (adult) (pediatric): Secondary | ICD-10-CM

## 2024-05-16 DIAGNOSIS — Z125 Encounter for screening for malignant neoplasm of prostate: Secondary | ICD-10-CM | POA: Diagnosis not present

## 2024-05-16 DIAGNOSIS — I10 Essential (primary) hypertension: Secondary | ICD-10-CM

## 2024-05-16 DIAGNOSIS — Z13220 Encounter for screening for lipoid disorders: Secondary | ICD-10-CM | POA: Diagnosis not present

## 2024-05-16 DIAGNOSIS — Z0000 Encounter for general adult medical examination without abnormal findings: Secondary | ICD-10-CM

## 2024-05-16 DIAGNOSIS — J4540 Moderate persistent asthma, uncomplicated: Secondary | ICD-10-CM | POA: Diagnosis not present

## 2024-05-16 DIAGNOSIS — K219 Gastro-esophageal reflux disease without esophagitis: Secondary | ICD-10-CM

## 2024-05-16 DIAGNOSIS — F411 Generalized anxiety disorder: Secondary | ICD-10-CM

## 2024-05-16 LAB — COMPREHENSIVE METABOLIC PANEL WITH GFR
ALT: 22 U/L (ref 0–53)
AST: 22 U/L (ref 0–37)
Albumin: 4.3 g/dL (ref 3.5–5.2)
Alkaline Phosphatase: 49 U/L (ref 39–117)
BUN: 22 mg/dL (ref 6–23)
CO2: 26 meq/L (ref 19–32)
Calcium: 9.5 mg/dL (ref 8.4–10.5)
Chloride: 106 meq/L (ref 96–112)
Creatinine, Ser: 1.04 mg/dL (ref 0.40–1.50)
GFR: 79.43 mL/min (ref >60.00)
Glucose, Bld: 89 mg/dL (ref 70–99)
Potassium: 4.2 meq/L (ref 3.5–5.1)
Sodium: 140 meq/L (ref 135–145)
Total Bilirubin: 0.4 mg/dL (ref 0.2–1.2)
Total Protein: 6.5 g/dL (ref 6.0–8.3)

## 2024-05-16 LAB — LIPID PANEL
Cholesterol: 178 mg/dL (ref 0–200)
HDL: 44.7 mg/dL (ref >39.00)
LDL Cholesterol: 119 mg/dL — ABNORMAL HIGH (ref 0–99)
NonHDL: 133.68
Total CHOL/HDL Ratio: 4
Triglycerides: 72 mg/dL (ref 0.0–149.0)
VLDL: 14.4 mg/dL (ref 0.0–40.0)

## 2024-05-16 LAB — PSA: PSA: 0.74 ng/mL (ref 0.10–4.00)

## 2024-05-16 NOTE — Assessment & Plan Note (Signed)
Stable, chronic.  Continue current medication.  Losartan 50 mg daily 

## 2024-05-16 NOTE — Progress Notes (Signed)
 Patient ID: Blake Molina, male    DOB: 12-Nov-1965, 58 y.o.   MRN: [id number]  This visit was conducted in person.  BP 120/78   Pulse 74   Temp 97.8 F (36.6 C) (Oral)   Ht 5' 10 (1.778 m)   Wt 183 lb 2 oz (83.1 kg)   SpO2 98%   BMI 26.28 kg/m    CC:  Chief Complaint  Patient presents with   Annual Exam    Subjective:   HPI: Blake Molina is a 58 y.o. male presenting on 05/16/2024 for Annual Exam  The patient presents for complete physical and review of chronic health problems. He/She also has the following acute concerns today:  none  Hypertension:  Well-controlled on losartan  50 mg daily BP Readings from Last 3 Encounters:  05/16/24 120/78  07/21/23 102/70  07/12/23 122/68  Using medication without problems or lightheadedness:   none Chest pain with exertion: none Edema:none Short of breath: none Average home BPs: Other issues:  Moderate persistent allergic asthma:  Using symbicort  daily.  Associated with dust and shipment of hay. Using albuterol  as needed as well as allergy treatment  Snoring: Referred for sleep study 2022 : Showed mild obstructive sleep apnea  He denies daytime fatigue, wakes up refreshed if went to be on time.  Due for chol screening and PSA   Moderate diet, drinking soda frequently.  Physical job, walking all day.  Relevant past medical, surgical, family and social history reviewed and updated as indicated. Interim medical history since our last visit reviewed. Allergies and medications reviewed and updated. Outpatient Medications Prior to Visit  Medication Sig Dispense Refill   Albuterol -Budesonide  (AIRSUPRA ) 90-80 MCG/ACT AERO TAKE 2 PUFFS BY MOUTH EVERY 4 HOURS AS NEEDED 5.9 g 3   budesonide -formoterol  (SYMBICORT ) 80-4.5 MCG/ACT inhaler Inhale 2 puffs into the lungs 2 (two) times daily. 1 each 11   escitalopram  (LEXAPRO ) 10 MG tablet TAKE 1 AND 1/2 TABLETS DAILY BY MOUTH 135 tablet 1   fluticasone  (FLONASE ) 50 MCG/ACT nasal  spray Place 2 sprays into both nostrils daily. 16 g 6   lansoprazole  (PREVACID ) 30 MG capsule TAKE 1 CAPSULE BY MOUTH EVERY DAY 90 capsule 0   losartan  (COZAAR ) 50 MG tablet TAKE 1 TABLET BY MOUTH EVERY DAY 90 tablet 0   oseltamivir  (TAMIFLU ) 75 MG capsule Take 1 capsule (75 mg total) by mouth 2 (two) times daily. 10 capsule 0   predniSONE  (DELTASONE ) 20 MG tablet 2 tablets for 5 days then 1 tablet for 5 days then half tablet for 4 days 17 tablet 0   No facility-administered medications prior to visit.     Per HPI unless specifically indicated in ROS section below Review of Systems  Constitutional:  Negative for fatigue and fever.  HENT:  Negative for ear pain.   Eyes:  Negative for pain.  Respiratory:  Negative for cough and shortness of breath.   Cardiovascular:  Negative for chest pain, palpitations and leg swelling.  Gastrointestinal:  Negative for abdominal pain.  Genitourinary:  Negative for dysuria.  Musculoskeletal:  Negative for arthralgias.  Neurological:  Negative for syncope, light-headedness and headaches.  Psychiatric/Behavioral:  Negative for dysphoric mood.    Objective:  BP 120/78   Pulse 74   Temp 97.8 F (36.6 C) (Oral)   Ht 5' 10 (1.778 m)   Wt 183 lb 2 oz (83.1 kg)   SpO2 98%   BMI 26.28 kg/m   Wt Readings from Last 3 Encounters:  05/16/24 183 lb 2 oz (83.1 kg)  07/21/23 179 lb (81.2 kg)  07/12/23 182 lb 8 oz (82.8 kg)      Physical Exam Constitutional:      General: He is not in acute distress.    Appearance: Normal appearance. He is well-developed. He is not ill-appearing or toxic-appearing.  HENT:     Head: Normocephalic and atraumatic.     Right Ear: Hearing, tympanic membrane, ear canal and external ear normal.     Left Ear: Hearing, tympanic membrane, ear canal and external ear normal.     Nose: Nose normal.     Mouth/Throat:     Pharynx: Uvula midline.  Eyes:     General: Lids are normal. Lids are everted, no foreign bodies appreciated.      Conjunctiva/sclera: Conjunctivae normal.     Pupils: Pupils are equal, round, and reactive to light.  Neck:     Thyroid: No thyroid mass or thyromegaly.     Vascular: No carotid bruit.     Trachea: Trachea and phonation normal.  Cardiovascular:     Rate and Rhythm: Normal rate and regular rhythm.     Pulses: Normal pulses.     Heart sounds: S1 normal and S2 normal. No murmur heard.    No gallop.  Pulmonary:     Breath sounds: Normal breath sounds. No wheezing, rhonchi or rales.  Abdominal:     General: Bowel sounds are normal.     Palpations: Abdomen is soft.     Tenderness: There is no abdominal tenderness. There is no guarding or rebound.     Hernia: No hernia is present.  Musculoskeletal:     Cervical back: Normal range of motion and neck supple.     Right knee: Swelling present. No tenderness.  Lymphadenopathy:     Cervical: No cervical adenopathy.  Skin:    General: Skin is warm and dry.     Findings: No rash.  Neurological:     Mental Status: He is alert.     Cranial Nerves: No cranial nerve deficit.     Sensory: No sensory deficit.     Gait: Gait normal.     Deep Tendon Reflexes: Reflexes are normal and symmetric.  Psychiatric:        Speech: Speech normal.        Behavior: Behavior normal.        Judgment: Judgment normal.       Results for orders placed or performed in visit on 07/21/23  Influenza A/B   Collection Time: 07/21/23  4:11 PM  Result Value Ref Range   Influenza A, POC Negative Negative   Influenza B, POC Negative Negative     COVID 19 screen:  No recent travel or known exposure to COVID19 The patient denies respiratory symptoms of COVID 19 at this time. The importance of social distancing was discussed today.   Assessment and Plan   The patient's preventative maintenance and recommended screening tests for an annual wellness exam were reviewed in full today. Brought up to date unless services declined.  Counselled on the importance of  diet, exercise, and its role in overall health and mortality. The patient's FH and SH was reviewed, including their home life, tobacco status, and drug and alcohol status.   Vaccines: uptodate with  Td.  Flu/ COVID/Shingrix and PNA vaccine:refused. Prostate Cancer Screen: Chronic, stable Lab Results  Component Value Date   PSA1 0.7 09/23/2020   PSA1 0.8 11/09/2018   PSA1  0.8 11/02/2017   PSA 0.79 04/28/2023   PSA 0.68 04/29/2022   PSA 0.77 08/15/2018  Colon Cancer Screen:  01/2019 polyp repeat in 10 years Dr. Jinny. Smoking Status: nonsmoker ETOH/ drug use: rare/ none HIV screen:   Refused   Problem List Items Addressed This Visit     Gastroesophageal reflux disease (Chronic)   Stable, chronic.  Continue current medication.   Lansoprazole  40 mg daily and trigger avoidance      Generalized anxiety disorder   Chronic, improved control on Lexapro  15 mg daily      Hypertension, essential, benign (Chronic)   Stable, chronic.  Continue current medication.  Losartan  50 mg daily      Mild obstructive sleep apnea   Chronic, pt asymptmatic now.. will hold on CPAP referral unless symptoms.      Moderate persistent allergic asthma (Chronic)   Chronic,   Symbicort  2 puffs twice daily. Airsupra  prn.      Other Visit Diagnoses       Routine general medical examination at a health care facility    -  Primary     Screening cholesterol level       Relevant Orders   Lipid panel   Comprehensive metabolic panel with GFR     Prostate cancer screening       Relevant Orders   PSA          Deisy Ozbun, MD

## 2024-05-16 NOTE — Assessment & Plan Note (Signed)
 Chronic, improved control on Lexapro 15 mg daily

## 2024-05-16 NOTE — Assessment & Plan Note (Signed)
Stable, chronic.  Continue current medication.   Lansoprazole 40 mg daily and trigger avoidance

## 2024-05-16 NOTE — Assessment & Plan Note (Addendum)
 Chronic, pt asymptmatic now.. will hold on CPAP referral unless symptoms.

## 2024-05-16 NOTE — Assessment & Plan Note (Addendum)
 Chronic,   Symbicort  2 puffs twice daily. Airsupra  prn.

## 2024-06-20 ENCOUNTER — Other Ambulatory Visit: Payer: Self-pay | Admitting: Family Medicine

## 2024-06-21 ENCOUNTER — Other Ambulatory Visit: Payer: Self-pay | Admitting: Family Medicine

## 2024-06-30 ENCOUNTER — Other Ambulatory Visit: Payer: Self-pay | Admitting: Family Medicine

## 2024-06-30 DIAGNOSIS — I10 Essential (primary) hypertension: Secondary | ICD-10-CM

## 2025-05-17 ENCOUNTER — Encounter: Admitting: Family Medicine
